# Patient Record
Sex: Female | Born: 1970 | Race: Black or African American | Hispanic: No | Marital: Single | Smoking: Never smoker
Health system: Southern US, Community
[De-identification: ages and names within clinical notes are randomized; demographics above are authoritative.]

## PROBLEM LIST (undated history)

## (undated) DIAGNOSIS — E119 Type 2 diabetes mellitus without complications: Secondary | ICD-10-CM

## (undated) DIAGNOSIS — K219 Gastro-esophageal reflux disease without esophagitis: Secondary | ICD-10-CM

## (undated) DIAGNOSIS — I10 Essential (primary) hypertension: Secondary | ICD-10-CM

## (undated) DIAGNOSIS — R519 Headache, unspecified: Secondary | ICD-10-CM

## (undated) DIAGNOSIS — R51 Headache: Secondary | ICD-10-CM

## (undated) DIAGNOSIS — D649 Anemia, unspecified: Secondary | ICD-10-CM

## (undated) HISTORY — DX: Type 2 diabetes mellitus without complications: E11.9

## (undated) HISTORY — PX: WISDOM TOOTH EXTRACTION: SHX21

---

## 1990-05-16 HISTORY — PX: TUBAL LIGATION: SHX77

## 2012-09-14 ENCOUNTER — Encounter (HOSPITAL_COMMUNITY): Payer: Self-pay | Admitting: Emergency Medicine

## 2012-09-14 ENCOUNTER — Emergency Department (HOSPITAL_COMMUNITY)
Admission: EM | Admit: 2012-09-14 | Discharge: 2012-09-15 | Disposition: A | Discharge status: Home / Self Care | Payer: No Typology Code available for payment source | Attending: Emergency Medicine | Admitting: Emergency Medicine

## 2012-09-14 DIAGNOSIS — K0889 Other specified disorders of teeth and supporting structures: Secondary | ICD-10-CM

## 2012-09-14 DIAGNOSIS — K089 Disorder of teeth and supporting structures, unspecified: Secondary | ICD-10-CM | POA: Insufficient documentation

## 2012-09-14 NOTE — ED Notes (Signed)
Pt states that a filling came out of her right upper tooth several weeks ago. Having pain off and on since then, became more intense over the past couple of weeks.

## 2012-09-14 NOTE — ED Notes (Signed)
PT. REPORTS RIGHT UPPER/LOWER MOLAR PAIN FOR SEVERAL WEEKS WORSE TODAY UNRELIEVED BY OTC IBUPROFEN .

## 2012-09-15 MED ORDER — HYDROCODONE-ACETAMINOPHEN 5-325 MG PO TABS: 1.0000 | ORAL_TABLET | ORAL | Status: DC | PRN

## 2012-09-15 MED ORDER — PENICILLIN V POTASSIUM 500 MG PO TABS: 500.0000 mg | ORAL_TABLET | Freq: Four times a day (QID) | ORAL | Status: AC

## 2012-09-15 NOTE — ED Provider Notes (Signed)
Medical screening examination/treatment/procedure(s) were performed by non-physician practitioner and as supervising physician I was immediately available for consultation/collaboration.   Rolan Bucco, MD 09/15/12 [phone]

## 2012-09-15 NOTE — ED Provider Notes (Signed)
History     CSN: [id number]  Arrival date & time 09/14/12  2148   First MD Initiated Contact with Patient 09/14/12 2229      Chief Complaint  Patient presents with  . Dental Pain    (Consider location/radiation/quality/duration/timing/severity/associated sxs/prior treatment) HPI Comments: Jessica Garrett is a 42 y/o F presenting to the ED with dental pain. Reported that the dental pain has been ongoing for the past week, mainly to the right upper and lower jaw with radiation to the right cheek, right ear, around the right eye, and right side of the neck. Described pain to be an intermittent throbbing sensation that worsens with chewing on the right side. Reported using Ibuprofen with minimal relief. Denied fever, chills, swelling, inflammation, abscess or cyst formation, visual distortions, numbness, tingling, gi symptoms, urinary symptoms.  Stated that she has not been to a dentist in years.   Patient is a 42 y.o. female presenting with tooth pain. The history is provided by the patient. No language interpreter was used.  Dental PainPrimary symptoms do not include headaches, fever, shortness of breath, sore throat or cough.  Additional symptoms do not include: trouble swallowing, ear pain and fatigue.    History reviewed. No pertinent past medical history.  History reviewed. No pertinent past surgical history.  No family history on file.  History  Substance Use Topics  . Smoking status: Never Smoker   . Smokeless tobacco: Not on file  . Alcohol Use: No    OB History   Grav Para Term Preterm Abortions TAB SAB Ect Mult Living                  Review of Systems  Constitutional: Negative for fever, chills and fatigue.  HENT: Positive for dental problem. Negative for ear pain, sore throat, trouble swallowing, neck pain, neck stiffness and tinnitus.   Eyes: Negative for photophobia, pain and visual disturbance.  Respiratory: Negative for cough, chest tightness and shortness of  breath.   Cardiovascular: Negative for chest pain.  Gastrointestinal: Negative for nausea, vomiting, abdominal pain, diarrhea and constipation.  Genitourinary: Negative for dysuria, hematuria, decreased urine volume and difficulty urinating.  Musculoskeletal: Positive for arthralgias.  Skin: Negative for rash.  Neurological: Negative for dizziness, weakness, light-headedness, numbness and headaches.  All other systems reviewed and are negative.    Allergies  Review of patient's allergies indicates no known allergies.  Home Medications   Current Outpatient Rx  Name  Route  Sig  Dispense  Refill  . ibuprofen (ADVIL,MOTRIN) 800 MG tablet   Oral   Take 800 mg by mouth every 8 (eight) hours as needed for pain. For pain         . HYDROcodone-acetaminophen (NORCO) 5-325 MG per tablet   Oral   Take 1 tablet by mouth every 4 (four) hours as needed for pain.   6 tablet   0   . penicillin v potassium (VEETID) 500 MG tablet   Oral   Take 1 tablet (500 mg total) by mouth 4 (four) times daily.   40 tablet   0     BP 174/93  Pulse 97  Temp(Src) 98.5 F (36.9 C) (Oral)  Resp 16  SpO2 99%  LMP 08/28/2012  Physical Exam  Nursing note and vitals reviewed. Constitutional: She is oriented to person, place, and time. She appears well-developed and well-nourished. No distress.  HENT:  Head: Normocephalic and atraumatic. No trismus in the jaw.  Mouth/Throat: Uvula is midline and oropharynx is  clear and moist. Mucous membranes are not pale, not dry and not cyanotic. She does not have dentures. No oral lesions. Dental caries present. No dental abscesses, edematous or lacerations. No oropharyngeal exudate, posterior oropharyngeal edema, posterior oropharyngeal erythema or tonsillar abscesses.    Uvula midline, symmetrical elevation  Mouth: Negative facial swelling, erythema, inflammation, sign of infection. Mucus membranes moist. No swelling, erythema, lesions, abscess, cyst, drainage,  bleeding, sign of infection noted to upper and lower gums and buccal mucosa. No sublingual lesions. No trismus Second lower molar on the right side noted to be loose - pain upon palpation with tongue depressor.   Eyes: Conjunctivae and EOM are normal. Pupils are equal, round, and reactive to light. Right eye exhibits no discharge. Left eye exhibits no discharge.  Neck: Normal range of motion. Neck supple. No tracheal deviation present.  Negative nuchal rigidity Negative lymphadenopathy   Cardiovascular: Normal rate, regular rhythm and normal heart sounds.  Exam reveals no friction rub.   No murmur heard. Radial pulses 2+ bilaterally  Pulmonary/Chest: Effort normal and breath sounds normal. No respiratory distress. She has no wheezes. She has no rales.  Lymphadenopathy:    She has no cervical adenopathy.  Neurological: She is alert and oriented to person, place, and time. No cranial nerve deficit. She exhibits normal muscle tone. Coordination normal.  Cranial nerves III-XII grossly intact  Skin: Skin is warm and dry. No rash noted. She is not diaphoretic. No erythema.  Psychiatric: She has a normal mood and affect. Her behavior is normal. Thought content normal.    ED Course  Procedures (including critical care time)  Labs Reviewed - No data to display No results found.   1. Pain, dental       MDM  I personally examined and evaluated the patient. Patient afebrile, normotensive, non-tachycardic, alert and oriented. Loose second right lower molar noted - pain with palpation to rigth lower second molar with tongue depressor. Dental caries noted, numerous, to upper and lower jaw. Negative sublingual lesion, negative trismus - r/o Ludwig's angina. Negative sign of infection or inflammation. Negative peritonsillar abscess noted. No sign of cyst or abscess noted to mouth. Patient aseptic, non-toxic appearing, in no acute distress - patient discharged. Patient discharged with dental pain from  poor dental hygiene. Discharged patient with antibiotic coverage and pain medications - discussed pain medications restrictions and precautions. Discussed to stay hydrated and ice. Referred patient to dentist - gave resource list. Discussed with patient to monitor symptoms and if symptoms are to worsen to report back to the ED.  Patient agreed to plan of care, understood, all questions answered.          Raymon Mutton, PA-C 09/15/12 [phone]

## 2014-01-07 ENCOUNTER — Encounter (HOSPITAL_COMMUNITY): Payer: Self-pay | Admitting: Emergency Medicine

## 2014-01-07 ENCOUNTER — Emergency Department (HOSPITAL_COMMUNITY)
Admission: EM | Admit: 2014-01-07 | Discharge: 2014-01-07 | Disposition: A | Discharge status: Home / Self Care | Payer: No Typology Code available for payment source | Attending: Emergency Medicine | Admitting: Emergency Medicine

## 2014-01-07 DIAGNOSIS — M545 Low back pain, unspecified: Secondary | ICD-10-CM | POA: Insufficient documentation

## 2014-01-07 DIAGNOSIS — Z79899 Other long term (current) drug therapy: Secondary | ICD-10-CM | POA: Diagnosis not present

## 2014-01-07 DIAGNOSIS — R1013 Epigastric pain: Secondary | ICD-10-CM | POA: Insufficient documentation

## 2014-01-07 DIAGNOSIS — Z3202 Encounter for pregnancy test, result negative: Secondary | ICD-10-CM | POA: Diagnosis not present

## 2014-01-07 LAB — URINE MICROSCOPIC-ADD ON

## 2014-01-07 LAB — URINALYSIS, ROUTINE W REFLEX MICROSCOPIC
Bilirubin Urine: NEGATIVE
GLUCOSE, UA: NEGATIVE mg/dL
Ketones, ur: NEGATIVE mg/dL
Leukocytes, UA: NEGATIVE
Nitrite: NEGATIVE
Protein, ur: NEGATIVE mg/dL
SPECIFIC GRAVITY, URINE: 1.011 (ref 1.005–1.030)
UROBILINOGEN UA: 0.2 mg/dL (ref 0.0–1.0)
pH: 6 (ref 5.0–8.0)

## 2014-01-07 LAB — CBC WITH DIFFERENTIAL/PLATELET
BASOS ABS: 0 10*3/uL (ref 0.0–0.1)
Basophils Relative: 0 % (ref 0–1)
Eosinophils Absolute: 0.1 10*3/uL (ref 0.0–0.7)
Eosinophils Relative: 1 % (ref 0–5)
HCT: 43.7 % (ref 36.0–46.0)
HEMOGLOBIN: 14.6 g/dL (ref 12.0–15.0)
LYMPHS PCT: 33 % (ref 12–46)
Lymphs Abs: 2.9 10*3/uL (ref 0.7–4.0)
MCH: 31.9 pg (ref 26.0–34.0)
MCHC: 33.4 g/dL (ref 30.0–36.0)
MCV: 95.6 fL (ref 78.0–100.0)
MONOS PCT: 7 % (ref 3–12)
Monocytes Absolute: 0.6 10*3/uL (ref 0.1–1.0)
NEUTROS ABS: 5.3 10*3/uL (ref 1.7–7.7)
Neutrophils Relative %: 59 % (ref 43–77)
Platelets: 293 10*3/uL (ref 150–400)
RBC: 4.57 MIL/uL (ref 3.87–5.11)
RDW: 13.4 % (ref 11.5–15.5)
WBC: 8.8 10*3/uL (ref 4.0–10.5)

## 2014-01-07 LAB — COMPREHENSIVE METABOLIC PANEL
ALBUMIN: 3.8 g/dL (ref 3.5–5.2)
ALT: 15 U/L (ref 0–35)
ANION GAP: 10 (ref 5–15)
AST: 16 U/L (ref 0–37)
Alkaline Phosphatase: 61 U/L (ref 39–117)
BUN: 9 mg/dL (ref 6–23)
CO2: 25 mEq/L (ref 19–32)
CREATININE: 0.76 mg/dL (ref 0.50–1.10)
Calcium: 9.4 mg/dL (ref 8.4–10.5)
Chloride: 102 mEq/L (ref 96–112)
GFR calc Af Amer: >90 mL/min (ref >90)
GFR calc non Af Amer: >90 mL/min (ref >90)
Glucose, Bld: 115 mg/dL — ABNORMAL HIGH (ref 70–99)
Potassium: 4.2 mEq/L (ref 3.7–5.3)
Sodium: 137 mEq/L (ref 137–147)
TOTAL PROTEIN: 7.6 g/dL (ref 6.0–8.3)
Total Bilirubin: 0.3 mg/dL (ref 0.3–1.2)

## 2014-01-07 LAB — LIPASE, BLOOD: Lipase: 18 U/L (ref 11–59)

## 2014-01-07 LAB — POC URINE PREG, ED: PREG TEST UR: NEGATIVE

## 2014-01-07 MED ORDER — OXYCODONE-ACETAMINOPHEN 5-325 MG PO TABS: 1.0000 | ORAL_TABLET | Freq: Once | ORAL | Status: DC

## 2014-01-07 MED ORDER — PANTOPRAZOLE SODIUM 20 MG PO TBEC: 20.0000 mg | DELAYED_RELEASE_TABLET | Freq: Every day | ORAL | Status: DC

## 2014-01-07 MED ORDER — HYDROCODONE-ACETAMINOPHEN 5-325 MG PO TABS: 1.0000 | ORAL_TABLET | ORAL | Status: DC | PRN

## 2014-01-07 NOTE — ED Notes (Signed)
Pt reports upper abd pain for over a month, now having lower sharp back pain. Denies any n/v/d or urinary symptoms.

## 2014-01-07 NOTE — ED Notes (Addendum)
Pt here for evaluation of lower back pain since Friday, as well as mid abdominal pain for about a month. Denies painful urination. Denies nausea vomiting or abnormal bowel movements.

## 2014-01-07 NOTE — Discharge Instructions (Signed)
Use Maalox, 30 cc, before meals and at bedtime. Stop taking Ibuprofen. If your abdomen is not better by Friday, ask your doctor to order an abdominal ultrasound.   Abdominal Pain Many things can cause abdominal pain. Usually, abdominal pain is not caused by a disease and will improve without treatment. It can often be observed and treated at home. Your health care provider will do a physical exam and possibly order blood tests and X-rays to help determine the seriousness of your pain. However, in many cases, more time must pass before a clear cause of the pain can be found. Before that point, your health care provider may not know if you need more testing or further treatment. HOME CARE INSTRUCTIONS  Monitor your abdominal pain for any changes. The following actions may help to alleviate any discomfort you are experiencing:  Only take over-the-counter or prescription medicines as directed by your health care provider.  Do not take laxatives unless directed to do so by your health care provider.  Try a clear liquid diet (broth, tea, or water) as directed by your health care provider. Slowly move to a bland diet as tolerated. SEEK MEDICAL CARE IF:  You have unexplained abdominal pain.  You have abdominal pain associated with nausea or diarrhea.  You have pain when you urinate or have a bowel movement.  You experience abdominal pain that wakes you in the night.  You have abdominal pain that is worsened or improved by eating food.  You have abdominal pain that is worsened with eating fatty foods.  You have a fever. SEEK IMMEDIATE MEDICAL CARE IF:   Your pain does not go away within 2 hours.  You keep throwing up (vomiting).  Your pain is felt only in portions of the abdomen, such as the right side or the left lower portion of the abdomen.  You pass bloody or black tarry stools. MAKE SURE YOU:  Understand these instructions.   Will watch your condition.   Will get help right  away if you are not doing well or get worse.  Document Released: 02/09/2005 Document Revised: 05/07/2013 Document Reviewed: 01/09/2013 [facility] Patient Information 2015 [place], [place]. This information is not intended to replace advice given to you by your health care provider. Make sure you discuss any questions you have with your health care provider.  Back Pain, Adult Low back pain is very common. About 1 in 5 people have back pain.The cause of low back pain is rarely dangerous. The pain often gets better over time.About half of people with a sudden onset of back pain feel better in just 2 weeks. About 8 in 10 people feel better by 6 weeks.  CAUSES Some common causes of back pain include:  Strain of the muscles or ligaments supporting the spine.  Wear and tear (degeneration) of the spinal discs.  Arthritis.  Direct injury to the back. DIAGNOSIS Most of the time, the direct cause of low back pain is not known.However, back pain can be treated effectively even when the exact cause of the pain is unknown.Answering your caregiver's questions about your overall health and symptoms is one of the most accurate ways to make sure the cause of your pain is not dangerous. If your caregiver needs more information, he or she may order lab work or imaging tests (X-rays or MRIs).However, even if imaging tests show changes in your back, this usually does not require surgery. HOME CARE INSTRUCTIONS For many people, back pain returns.Since low back pain is  rarely dangerous, it is often a condition that people can learn to [facility] their own.   Remain active. It is stressful on the back to sit or stand in one place. Do not sit, drive, or stand in one place for more than 30 minutes at a time. Take short walks on level surfaces as soon as pain allows.Try to increase the length of time you walk each day.  Do not stay in bed.Resting more than 1 or 2 days can delay your recovery.  Do not avoid  exercise or work.Your body is made to move.It is not dangerous to be active, even though your back may hurt.Your back will likely heal faster if you return to being active before your pain is gone.  Pay attention to your body when you bend and lift. Many people have less discomfortwhen lifting if they bend their knees, keep the load close to their bodies,and avoid twisting. Often, the most comfortable positions are those that put less stress on your recovering back.  Find a comfortable position to sleep. Use a firm mattress and lie on your side with your knees slightly bent. If you lie on your back, put a pillow under your knees.  Only take over-the-counter or prescription medicines as directed by your caregiver. Over-the-counter medicines to reduce pain and inflammation are often the most helpful.Your caregiver may prescribe muscle relaxant drugs.These medicines help dull your pain so you can more quickly return to your normal activities and healthy exercise.  Put ice on the injured area.  Put ice in a plastic bag.  Place a towel between your skin and the bag.  Leave the ice on for 15-20 minutes, 03-04 times a day for the first 2 to 3 days. After that, ice and heat may be alternated to reduce pain and spasms.  Ask your caregiver about trying back exercises and gentle massage. This may be of some benefit.  Avoid feeling anxious or stressed.Stress increases muscle tension and can worsen back pain.It is important to recognize when you are anxious or stressed and learn ways to manage it.Exercise is a great option. SEEK MEDICAL CARE IF:  You have pain that is not relieved with rest or medicine.  You have pain that does not improve in 1 week.  You have new symptoms.  You are generally not feeling well. SEEK IMMEDIATE MEDICAL CARE IF:   You have pain that radiates from your back into your legs.  You develop new bowel or bladder control problems.  You have unusual weakness or  numbness in your arms or legs.  You develop nausea or vomiting.  You develop abdominal pain.  You feel faint. Document Released: 05/02/2005 Document Revised: 11/01/2011 Document Reviewed: 09/03/2013 [facility] Patient Information 2015 [place], [place]. This information is not intended to replace advice given to you by your health care provider. Make sure you discuss any questions you have with your health care provider.  Back Exercises Back exercises help treat and prevent back injuries. The goal is to increase your strength in your belly (abdominal) and back muscles. These exercises can also help with flexibility. Start these exercises when told by your doctor. HOME CARE Back exercises include: Pelvic Tilt.  Lie on your back with your knees bent. Tilt your pelvis until the lower part of your back is against the floor. Hold this position 5 to 10 sec. Repeat this exercise 5 to 10 times. Knee to Chest.  Pull 1 knee up against your chest and hold for 20 to 30  seconds. Repeat this with the other knee. This may be done with the other leg straight or bent, whichever feels better. Then, pull both knees up against your chest. Sit-Ups or Curl-Ups.  Bend your knees 90 degrees. Start with tilting your pelvis, and do a partial, slow sit-up. Only lift your upper half 30 to 45 degrees off the floor. Take at least 2 to 3 seonds for each sit-up. Do not do sit-ups with your knees out straight. If partial sit-ups are difficult, simply do the above but with only tightening your belly (abdominal) muscles and holding it as told. Hip-Lift.  Lie on your back with your knees flexed 90 degrees. Push down with your feet and shoulders as you raise your hips 2 inches off the floor. Hold for 10 seconds, repeat 5 to 10 times. Back Arches.  Lie on your stomach. Prop yourself up on bent elbows. Slowly press on your hands, causing an arch in your low back. Repeat 3 to 5 times. Shoulder-Lifts.  Lie face down with arms  beside your body. Keep hips and belly pressed to floor as you slowly lift your head and shoulders off the floor. Do not overdo your exercises. Be careful in the beginning. Exercises may cause you some mild back discomfort. If the pain lasts for more than 15 minutes, stop the exercises until you see your doctor. Improvement with exercise for back problems is slow.  Document Released: 06/04/2010 Document Revised: 07/25/2011 Document Reviewed: 03/03/2011 [facility] Patient Information 2015 [place], [place]. This information is not intended to replace advice given to you by your health care provider. Make sure you discuss any questions you have with your health care provider.

## 2014-01-07 NOTE — ED Provider Notes (Signed)
CSN: [id number]     Arrival date & time 01/07/14  1156 History   First MD Initiated Contact with Patient 01/07/14 1609     Chief Complaint  Patient presents with  . Abdominal Pain     (Consider location/radiation/quality/duration/timing/severity/associated sxs/prior Treatment) HPI  Jessica Garrett is a 43 y.o. female who is here for evaluation of upper abdominal pain, present for one month. She's also been having ongoing lower back pain and is taking ibuprofen, for that. She saw a chiropractor yesterday and was told she needs a follow up appointment, next week. She has scheduled a new visit appointment with primary care doctor for later this week. She works as a Interior and spatial designer. Her abdominal pain is worse at night. There is no change in her abdominal pain with eating. Location of the abdominal pain is epigastric. She denies fever, chills, nausea, vomiting, diarrhea, cough, shortness of breath, paresthesias, or problems walking. She does not smoke cigarettes. There are no other known modifying factors.   History reviewed. No pertinent past medical history. History reviewed. No pertinent past surgical history. History reviewed. No pertinent family history. History  Substance Use Topics  . Smoking status: Never Smoker   . Smokeless tobacco: Not on file  . Alcohol Use: No   OB History   Grav Para Term Preterm Abortions TAB SAB Ect Mult Living                 Review of Systems  All other systems reviewed and are negative.     Allergies  Review of patient's allergies indicates no known allergies.  Home Medications   Prior to Admission medications   Medication Sig Start Date End Date Taking? Authorizing Provider  HYDROcodone-acetaminophen (NORCO) 5-325 MG per tablet Take 1 tablet by mouth every 4 (four) hours as needed for pain. 09/15/12   Marissa Sciacca, PA-C  HYDROcodone-acetaminophen (NORCO) 5-325 MG per tablet Take 1 tablet by mouth every 4 (four) hours as needed. 01/07/14   Flint Melter, MD  pantoprazole (PROTONIX) 20 MG tablet Take 1 tablet (20 mg total) by mouth daily. 01/07/14   Flint Melter, MD   BP 146/98  Pulse 75  Temp(Src) 98.7 F (37.1 C) (Oral)  Resp 16  Ht  (1.626 m)  Wt 150 lb (68.04 kg)  BMI 25.73 kg/m2  SpO2 98%  LMP 01/05/2014 Physical Exam  Nursing note and vitals reviewed. Constitutional: She is oriented to person, place, and time. She appears well-developed and well-nourished.  HENT:  Head: Normocephalic and atraumatic.  Eyes: Conjunctivae and EOM are normal. Pupils are equal, round, and reactive to light.  Neck: Normal range of motion and phonation normal. Neck supple.  Cardiovascular: Normal rate, regular rhythm and intact distal pulses.   Pulmonary/Chest: Effort normal and breath sounds normal. She exhibits no tenderness.  Abdominal: Soft. She exhibits no distension and no mass. There is tenderness (Mild epigastric tenderness. There is no right upper quadrant, tenderness, or guarding). There is no rebound and no guarding.  Musculoskeletal: Normal range of motion. She exhibits no edema.  Mild diffuse, low back pain to palpation with normal range of motion.  Neurological: She is alert and oriented to person, place, and time. She exhibits normal muscle tone.  Skin: Skin is warm and dry.  Psychiatric: She has a normal mood and affect. Her behavior is normal. Judgment and thought content normal.    ED Course  Procedures (including critical care time)  Medications - No data to display  Patient  Vitals for the past 24 hrs:  BP Temp Temp src Pulse Resp SpO2 Height Weight  01/07/14 1642 146/98 mmHg - - 75 16 98 % - -  01/07/14 1415 138/96 mmHg 98.7 F (37.1 C) Oral 73 17 100 % - -  01/07/14 1233 158/93 mmHg 98.2 F (36.8 C) Oral 71 18 98 %  (1.626 m) 150 lb (68.04 kg)   Findings were discussed with the patient, questions answered   Labs Review Labs Reviewed  COMPREHENSIVE METABOLIC PANEL - Abnormal; Notable for the  following:    Glucose, Bld 115 (*)    All other components within normal limits  URINALYSIS, ROUTINE W REFLEX MICROSCOPIC - Abnormal; Notable for the following:    Hgb urine dipstick MODERATE (*)    All other components within normal limits  URINE MICROSCOPIC-ADD ON - Abnormal; Notable for the following:    Squamous Epithelial / LPF FEW (*)    All other components within normal limits  CBC WITH DIFFERENTIAL  LIPASE, BLOOD  POC URINE PREG, ED    Imaging Review No results found.   EKG Interpretation None      MDM   Final diagnoses:  Epigastric pain  Low back pain without sciatica, unspecified back pain laterality   Nonspecific epigastric pain is likely related to ibuprofen use. She also may have onset of peptic ulcer disease. She will need to stop taking ibuprofen. I suspect her back pain is related to her job, and has resulted in muscle strain. This can be treated symptomatically, and expectantly. She has a short term followup with her PCP. Further evaluation can assume after that visit.  Nursing Notes Reviewed/ Care Coordinated Applicable Imaging Reviewed Interpretation of Laboratory Data incorporated into ED treatment  The patient appears reasonably screened and/or stabilized for discharge and I doubt any other medical condition or other [facility] requiring further screening, evaluation, or treatment in the ED at this time prior to discharge.  Plan: Home Medications- Norco, Protonix; Home Treatments- rest; return here if the recommended treatment, does not improve the symptoms; Recommended follow up- PCP as scheduled    Flint Melter, MD 01/07/14 1705

## 2014-01-07 NOTE — ED Notes (Signed)
Pt is here with upper abdominal pain and lower back pain.  Denies vomiting or diarrhea or nausea.

## 2015-01-06 ENCOUNTER — Emergency Department (HOSPITAL_COMMUNITY)
Admission: EM | Admit: 2015-01-06 | Discharge: 2015-01-07 | Disposition: A | Discharge status: Home / Self Care | Payer: No Typology Code available for payment source | Attending: Emergency Medicine | Admitting: Emergency Medicine

## 2015-01-06 ENCOUNTER — Encounter (HOSPITAL_COMMUNITY): Payer: Self-pay

## 2015-01-06 DIAGNOSIS — Z3202 Encounter for pregnancy test, result negative: Secondary | ICD-10-CM | POA: Insufficient documentation

## 2015-01-06 DIAGNOSIS — G43009 Migraine without aura, not intractable, without status migrainosus: Secondary | ICD-10-CM

## 2015-01-06 DIAGNOSIS — G43909 Migraine, unspecified, not intractable, without status migrainosus: Secondary | ICD-10-CM | POA: Insufficient documentation

## 2015-01-06 DIAGNOSIS — I158 Other secondary hypertension: Secondary | ICD-10-CM | POA: Insufficient documentation

## 2015-01-06 DIAGNOSIS — Z79899 Other long term (current) drug therapy: Secondary | ICD-10-CM | POA: Insufficient documentation

## 2015-01-06 MED ORDER — METOCLOPRAMIDE HCL 5 MG/ML IJ SOLN
10.0000 mg | Freq: Once | INTRAMUSCULAR | Status: AC
  Administered 2015-01-07: 10 mg via INTRAVENOUS
  Filled 2015-01-06: qty 2

## 2015-01-06 MED ORDER — KETOROLAC TROMETHAMINE 30 MG/ML IJ SOLN
30.0000 mg | Freq: Once | INTRAMUSCULAR | Status: AC
  Administered 2015-01-07: 30 mg via INTRAVENOUS
  Filled 2015-01-06: qty 1

## 2015-01-06 MED ORDER — DIPHENHYDRAMINE HCL 50 MG/ML IJ SOLN
25.0000 mg | Freq: Once | INTRAMUSCULAR | Status: AC
  Administered 2015-01-07: 25 mg via INTRAVENOUS
  Filled 2015-01-06: qty 1

## 2015-01-06 MED ORDER — SODIUM CHLORIDE 0.9 % IV BOLUS (SEPSIS)
1000.0000 mL | Freq: Once | INTRAVENOUS | Status: AC
  Administered 2015-01-07: 1000 mL via INTRAVENOUS

## 2015-01-06 NOTE — ED Notes (Signed)
Onset 3 days ago intermittant posterior headache.  Pt checked BP yesterday and was elevated.  No h/o HBP.  Pt took Aleve last night with no relief.  No other s/s.

## 2015-01-06 NOTE — ED Provider Notes (Signed)
This chart was scribed for Jessica Maw Jamison Yuhasz, DO by Jessica Garrett, ED Scribe. This patient was seen in room D35C/D35C and the patient's care was started 11:19 PM.    TIME SEEN: 11:19 PM   CHIEF COMPLAINT:  Chief Complaint  Patient presents with  . Headache     HPI:  HPI Comments: Jessica Garrett is a 44 y.o. female without any pertinent past medical history who presents to the Emergency Department complaining of an intermittent, ongoing, throbbing posterior moderate HA without radiation x 3 days. No recent injury or trauma to head. Pt states she took her blood pressure earlier this evening with an elevated reading.  States she checked her blood pressure because a friend recommended that she do so and was concerned that it was elevated and that was what prompted her to come to the emergency department. However, pt is unsure of what her blood pressure runs at baseline. HA is worsened with "being in the sun". No alleviating factors at this time. OTC Aleve attempted yesterday without any improvement for discomfort. Has had nausea with these headaches. No recent fever, chills, chest pain, vision changes, vomiting, or abdominal pain. No weakness, loss of sensation, or weakness. No personal or family history of HAs. States she has had similar headaches in the past, last time was in June. She is not currently followed by a PCP. Pt is not currently on any anticoagulants. No known allergies to medications.   ROS: See HPI Constitutional: no fever  Eyes: no drainage  ENT: no runny nose   Cardiovascular:  no chest pain  Resp: no SOB  GI: no vomiting. Positive for nausea GU: no dysuria Integumentary: no rash  Allergy: no hives  Musculoskeletal: no leg swelling  Neurological: no slurred speech. positive for headache  ROS otherwise negative  PAST MEDICAL HISTORY/PAST SURGICAL HISTORY:  History reviewed. No pertinent past medical history.  MEDICATIONS:  Prior to Admission medications   Medication Sig  Start Date End Date Taking? Authorizing Provider  HYDROcodone-acetaminophen (NORCO) 5-325 MG per tablet Take 1 tablet by mouth every 4 (four) hours as needed for pain. 09/15/12   Marissa Sciacca, PA-C  HYDROcodone-acetaminophen (NORCO) 5-325 MG per tablet Take 1 tablet by mouth every 4 (four) hours as needed. 01/07/14   Mancel Bale, MD  pantoprazole (PROTONIX) 20 MG tablet Take 1 tablet (20 mg total) by mouth daily. 01/07/14   Mancel Bale, MD    ALLERGIES:  No Known Allergies  SOCIAL HISTORY:  Social History  Substance Use Topics  . Smoking status: Never Smoker   . Smokeless tobacco: Not on file  . Alcohol Use: 3.6 oz/week    4 Shots of liquor, 2 Glasses of wine per week    FAMILY HISTORY: History reviewed. No pertinent family history.  EXAM: BP 189/100 mmHg  Pulse 87  Temp(Src) 98.5 F (36.9 C)  Resp 18  Ht  (1.626 m)  Wt 143 lb (64.864 kg)  BMI 24.53 kg/m2  SpO2 100%  LMP 01/03/2015 CONSTITUTIONAL: Alert and oriented and responds appropriately to questions. Well-appearing; well-nourished, in no significant distress HEAD: Normocephalic EYES: Conjunctivae clear, PERRL ENT: normal nose; no rhinorrhea; moist mucous membranes; pharynx without lesions noted NECK: Supple, no meningismus, no LAD  CARD: RRR; S1 and S2 appreciated; no murmurs, no clicks, no rubs, no gallops RESP: Normal chest excursion without splinting or tachypnea; breath sounds clear and equal bilaterally; no wheezes, no rhonchi, no rales, no hypoxia or respiratory distress, speaking full sentences ABD/GI: Normal bowel sounds;  non-distended; soft, non-tender, no rebound, no guarding, no peritoneal signs BACK:  The back appears normal and is non-tender to palpation, there is no CVA tenderness EXT: Normal ROM in all joints; non-tender to palpation; no edema; normal capillary refill; no cyanosis, no calf tenderness or swelling    SKIN: Normal color for age and race; warm NEURO: Moves all extremities equally,  sensation to light touch intact diffusely, cranial nerves II through XII intact PSYCH: The patient's mood and manner are appropriate. Grooming and personal hygiene are appropriate.  MEDICAL DECISION MAKING: Patient here with headache that sounds like a migraine headache. We'll treat with IV fluids, Toradol, Reglan and Benadryl. She is hypertensive in the emergency department but doubt intracranial hemorrhage. Will check for other signs of a normal damage with labs, urine, EKG. Discussed with patient that her hypertension may be secondary to her headache and have recommended that she keep a log of her blood pressure at home. She does not have a primary care physician.  ED PROGRESS: 1:00 AM  Pt's headache is gone. Her blood pressure is now 154/84. Labs unremarkable. She is not pregnant.  Patient's urine shows hematuria which she has had previously but no proteinuria. No other sign of end Garrett damage. I feel she is safe to be discharged. Have advised her to keep a log of her blood pressure and follow-up with a primary care physician. We'll provide with outpatient follow-up resources. We'll discharge with prescription for Fioricet, Phenergan if headaches return. Discussed return precautions. She verbalized understanding and is comfortable with plan.     EKG Interpretation  Date/Time:  Wednesday January 07 2015 00:38:35 EDT Ventricular Rate:  75 PR Interval:  161 QRS Duration: 62 QT Interval:  366 QTC Calculation: 409 R Axis:   48 Text Interpretation:  Sinus rhythm LAE, consider biatrial enlargement No old tracing to compare Confirmed by Abdirahman Chittum,  DO, Zylen Wenig ([id number]) on 01/07/2015 12:46:08 AM         I personally performed the services described in this documentation, which was scribed in my presence. The recorded information has been reviewed and is accurate.   Jessica Maw Khilynn Borntreger, DO 01/07/15 [phone]

## 2015-01-07 LAB — CBC WITH DIFFERENTIAL/PLATELET
BASOS ABS: 0 10*3/uL (ref 0.0–0.1)
BASOS PCT: 0 % (ref 0–1)
Eosinophils Absolute: 0.1 10*3/uL (ref 0.0–0.7)
Eosinophils Relative: 1 % (ref 0–5)
HCT: 43.3 % (ref 36.0–46.0)
Hemoglobin: 14.7 g/dL (ref 12.0–15.0)
Lymphocytes Relative: 53 % — ABNORMAL HIGH (ref 12–46)
Lymphs Abs: 4.4 10*3/uL — ABNORMAL HIGH (ref 0.7–4.0)
MCH: 31.7 pg (ref 26.0–34.0)
MCHC: 33.9 g/dL (ref 30.0–36.0)
MCV: 93.5 fL (ref 78.0–100.0)
MONO ABS: 0.9 10*3/uL (ref 0.1–1.0)
Monocytes Relative: 10 % (ref 3–12)
NEUTROS PCT: 36 % — AB (ref 43–77)
Neutro Abs: 2.9 10*3/uL (ref 1.7–7.7)
Platelets: 275 10*3/uL (ref 150–400)
RBC: 4.63 MIL/uL (ref 3.87–5.11)
RDW: 13.7 % (ref 11.5–15.5)
WBC: 8.3 10*3/uL (ref 4.0–10.5)

## 2015-01-07 LAB — URINE MICROSCOPIC-ADD ON

## 2015-01-07 LAB — BASIC METABOLIC PANEL
Anion gap: 12 (ref 5–15)
BUN: 11 mg/dL (ref 6–20)
CALCIUM: 9.4 mg/dL (ref 8.9–10.3)
CO2: 22 mmol/L (ref 22–32)
Chloride: 102 mmol/L (ref 101–111)
Creatinine, Ser: 0.91 mg/dL (ref 0.44–1.00)
GFR calc non Af Amer: >60 mL/min (ref >60)
Glucose, Bld: 93 mg/dL (ref 65–99)
Potassium: 4.1 mmol/L (ref 3.5–5.1)
SODIUM: 136 mmol/L (ref 135–145)

## 2015-01-07 LAB — URINALYSIS, ROUTINE W REFLEX MICROSCOPIC
BILIRUBIN URINE: NEGATIVE
Glucose, UA: NEGATIVE mg/dL
KETONES UR: NEGATIVE mg/dL
Leukocytes, UA: NEGATIVE
Nitrite: NEGATIVE
PROTEIN: NEGATIVE mg/dL
Specific Gravity, Urine: 1.008 (ref 1.005–1.030)
UROBILINOGEN UA: 0.2 mg/dL (ref 0.0–1.0)
pH: 6.5 (ref 5.0–8.0)

## 2015-01-07 LAB — POC URINE PREG, ED: PREG TEST UR: NEGATIVE

## 2015-01-07 MED ORDER — PROMETHAZINE HCL 25 MG PO TABS: 25.0000 mg | ORAL_TABLET | Freq: Four times a day (QID) | ORAL | Status: DC | PRN

## 2015-01-07 MED ORDER — BUTALBITAL-APAP-CAFFEINE 50-325-40 MG PO TABS: 1.0000 | ORAL_TABLET | Freq: Four times a day (QID) | ORAL | Status: AC | PRN

## 2015-01-07 MED ORDER — LORAZEPAM 2 MG/ML IJ SOLN: 1.0000 mg | Freq: Once | INTRAMUSCULAR | Status: DC

## 2015-01-07 NOTE — Discharge Instructions (Signed)
I think that your blood pressure being elevated today is secondary to your migraine headache. I recommend you keep a long of your blood pressure every day at the same time every day when you're feeling well so that you may present this to your primary care physician today can decide if you need to be started on blood pressure medication. A few develop chest pain, shortness of breath, changes in your vision, numbness or weakness on one side of your body, changes in your speech, please return to the hospital.   Migraine Headache A migraine headache is an intense, throbbing pain on one or both sides of your head. A migraine can last for 30 minutes to several hours. CAUSES  The exact cause of a migraine headache is not always known. However, a migraine may be caused when nerves in the brain become irritated and release chemicals that cause inflammation. This causes pain. Certain things may also trigger migraines, such as:  Alcohol.  Smoking.  Stress.  Menstruation.  Aged cheeses.  Foods or drinks that contain nitrates, glutamate, aspartame, or tyramine.  Lack of sleep.  Chocolate.  Caffeine.  Hunger.  Physical exertion.  Fatigue.  Medicines used to treat chest pain (nitroglycerine), birth control pills, estrogen, and some blood pressure medicines. SIGNS AND SYMPTOMS  Pain on one or both sides of your head.  Pulsating or throbbing pain.  Severe pain that prevents daily activities.  Pain that is aggravated by any physical activity.  Nausea, vomiting, or both.  Dizziness.  Pain with exposure to bright lights, loud noises, or activity.  General sensitivity to bright lights, loud noises, or smells. Before you get a migraine, you may get warning signs that a migraine is coming (aura). An aura may include:  Seeing flashing lights.  Seeing bright spots, halos, or zigzag lines.  Having tunnel vision or blurred vision.  Having feelings of numbness or tingling.  Having  trouble talking.  Having muscle weakness. DIAGNOSIS  A migraine headache is often diagnosed based on:  Symptoms.  Physical exam.  A CT scan or MRI of your head. These imaging tests cannot diagnose migraines, but they can help rule out other causes of headaches. TREATMENT Medicines may be given for pain and nausea. Medicines can also be given to help prevent recurrent migraines.  HOME CARE INSTRUCTIONS  Only take over-the-counter or prescription medicines for pain or discomfort as directed by your health care provider. The use of long-term narcotics is not recommended.  Lie down in a dark, quiet room when you have a migraine.  Keep a journal to find out what may trigger your migraine headaches. For example, write down:  What you eat and drink.  How much sleep you get.  Any change to your diet or medicines.  Limit alcohol consumption.  Quit smoking if you smoke.  Get 7-9 hours of sleep, or as recommended by your health care provider.  Limit stress.  Keep lights dim if bright lights bother you and make your migraines worse. SEEK IMMEDIATE MEDICAL CARE IF:   Your migraine becomes severe.  You have a fever.  You have a stiff neck.  You have vision loss.  You have muscular weakness or loss of muscle control.  You start losing your balance or have trouble walking.  You feel faint or pass out.  You have severe symptoms that are different from your first symptoms. MAKE SURE YOU:   Understand these instructions.  Will watch your condition.  Will get help right away  if you are not doing well or get worse. Document Released: 05/02/2005 Document Revised: 09/16/2013 Document Reviewed: 01/07/2013 [facility] Patient Information 2015 [place], [place]. This information is not intended to replace advice given to you by your health care provider. Make sure you discuss any questions you have with your health care provider.  How to Take Your Blood Pressure HOW DO I GET A BLOOD  PRESSURE MACHINE?  You can buy an electronic home blood pressure machine at your local pharmacy. Insurance will sometimes cover the cost if you have a prescription.  Ask your doctor what type of machine is best for you. There are different machines for your arm and your wrist.  If you decide to buy a machine to check your blood pressure on your arm, first check the size of your arm so you can buy the right size cuff. To check the size of your arm:   Use a measuring tape that shows both inches and centimeters.   Wrap the measuring tape around the upper-middle part of your arm. You may need someone to help you measure.   Write down your arm measurement in both inches and centimeters.   To measure your blood pressure correctly, it is important to have the right size cuff.   If your arm is up to 13 inches (up to 34 centimeters), get an adult cuff size.  If your arm is 13 to 17 inches (35 to 44 centimeters), get a large adult cuff size.    If your arm is 17 to 20 inches (45 to 52 centimeters), get an adult thigh cuff.  WHAT DO THE NUMBERS MEAN?   There are two numbers that make up your blood pressure. For example: 120/80.  The first number (120 in our example) is called the "systolic pressure." It is a measure of the pressure in your blood vessels when your heart is pumping blood.  The second number (80 in our example) is called the "diastolic pressure." It is a measure of the pressure in your blood vessels when your heart is resting between beats.  Your doctor will tell you what your blood pressure should be. WHAT SHOULD I DO BEFORE I CHECK MY BLOOD PRESSURE?   Try to rest or relax for at least 30 minutes before you check your blood pressure.  Do not smoke.  Do not have any drinks with caffeine, such as:  Soda.  Coffee.  Tea.  Check your blood pressure in a quiet room.  Sit down and stretch out your arm on a table. Keep your arm at about the level of your heart. Let  your arm relax.  Make sure that your legs are not crossed. HOW DO I CHECK MY BLOOD PRESSURE?  Follow the directions that came with your machine.  Make sure you remove any tight-fighting clothing from your arm or wrist. Wrap the cuff around your upper arm or wrist. You should be able to fit a finger between the cuff and your arm. If you cannot fit a finger between the cuff and your arm, it is too tight and should be removed and rewrapped.  Some units require you to manually pump up the arm cuff.  Automatic units inflate the cuff when you press a button.  Cuff deflation is automatic in both models.  After the cuff is inflated, the unit measures your blood pressure and pulse. The readings are shown on a monitor. Hold still and breathe normally while the cuff is inflated.  Getting a reading  takes less than a minute.  Some models store readings in a memory. Some provide a printout of readings. If your machine does not store your readings, keep a written record.  Take readings with you to your next visit with your doctor. Document Released: 04/14/2008 Document Revised: 09/16/2013 Document Reviewed: 06/27/2013 [facility] Patient Information 2015 [place], [place]. This information is not intended to replace advice given to you by your health care provider. Make sure you discuss any questions you have with your health care provider.  Hypertension Hypertension, commonly called high blood pressure, is when the force of blood pumping through your arteries is too strong. Your arteries are the blood vessels that carry blood from your heart throughout your body. A blood pressure reading consists of a higher number over a lower number, such as 110/72. The higher number (systolic) is the pressure inside your arteries when your heart pumps. The lower number (diastolic) is the pressure inside your arteries when your heart relaxes. Ideally you want your blood pressure below 120/80. Hypertension forces your heart  to work harder to pump blood. Your arteries may become narrow or stiff. Having hypertension puts you at risk for heart disease, stroke, and other problems.  RISK FACTORS Some risk factors for high blood pressure are controllable. Others are not.  Risk factors you cannot control include:   Race. You may be at higher risk if you are African American.  Age. Risk increases with age.  Gender. Men are at higher risk than women before age 21 years. After age 29, women are at higher risk than men. Risk factors you can control include:  Not getting enough exercise or physical activity.  Being overweight.  Getting too much fat, sugar, calories, or salt in your diet.  Drinking too much alcohol. SIGNS AND SYMPTOMS Hypertension does not usually cause signs or symptoms. Extremely high blood pressure (hypertensive crisis) may cause headache, anxiety, shortness of breath, and nosebleed. DIAGNOSIS  To check if you have hypertension, your health care provider will measure your blood pressure while you are seated, with your arm held at the level of your heart. It should be measured at least twice using the same arm. Certain conditions can cause a difference in blood pressure between your right and left arms. A blood pressure reading that is higher than normal on one occasion does not mean that you need treatment. If one blood pressure reading is high, ask your health care provider about having it checked again. TREATMENT  Treating high blood pressure includes making lifestyle changes and possibly taking medicine. Living a healthy lifestyle can help lower high blood pressure. You may need to change some of your habits. Lifestyle changes may include:  Following the DASH diet. This diet is high in fruits, vegetables, and whole grains. It is low in salt, red meat, and added sugars.  Getting at least 2 hours of brisk physical activity every week.  Losing weight if necessary.  Not smoking.  Limiting  alcoholic beverages.  Learning ways to reduce stress. If lifestyle changes are not enough to get your blood pressure under control, your health care provider may prescribe medicine. You may need to take more than one. Work closely with your health care provider to understand the risks and benefits. HOME CARE INSTRUCTIONS  Have your blood pressure rechecked as directed by your health care provider.   Take medicines only as directed by your health care provider. Follow the directions carefully. Blood pressure medicines must be taken as prescribed. The medicine  does not work as well when you skip doses. Skipping doses also puts you at risk for problems.   Do not smoke.   Monitor your blood pressure at home as directed by your health care provider. SEEK MEDICAL CARE IF:   You think you are having a reaction to medicines taken.  You have recurrent headaches or feel dizzy.  You have swelling in your ankles.  You have trouble with your vision. SEEK IMMEDIATE MEDICAL CARE IF:  You develop a severe headache or confusion.  You have unusual weakness, numbness, or feel faint.  You have severe chest or abdominal pain.  You vomit repeatedly.  You have trouble breathing. MAKE SURE YOU:   Understand these instructions.  Will watch your condition.  Will get help right away if you are not doing well or get worse. Document Released: 05/02/2005 Document Revised: 09/16/2013 Document Reviewed: 02/22/2013 [facility] Patient Information 2015 [place], [place]. This information is not intended to replace advice given to you by your health care provider. Make sure you discuss any questions you have with your health care provider.    Emergency Department Resource Guide 1) Find a Doctor and Pay Out of Pocket Although you won't have to find out who is covered by your insurance plan, it is a good idea to ask around and get recommendations. You will then need to call the office and see if the doctor  you have chosen will accept you as a new patient and what types of options they offer for patients who are self-pay. Some doctors offer discounts or will set up payment plans for their patients who do not have insurance, but you will need to ask so you aren't surprised when you get to your appointment.  2) Contact Your Local Health Department Not all health departments have doctors that can see patients for sick visits, but many do, so it is worth a call to see if yours does. If you don't know where your local health department is, you can check in your phone book. The CDC also has a tool to help you locate your state's health department, and many state websites also have listings of all of their local health departments.  3) Find a Walk-in Clinic If your illness is not likely to be very severe or complicated, you may want to try a walk in clinic. These are popping up all over the country in pharmacies, drugstores, and shopping centers. They're usually staffed by nurse practitioners or physician assistants that have been trained to treat common illnesses and complaints. They're usually fairly quick and inexpensive. However, if you have serious medical issues or chronic medical problems, these are probably not your best option.  No Primary Care Doctor: - Call Health Connect at  [phone] - they can help you locate a primary care doctor that  accepts your insurance, provides certain services, etc. - Physician Referral Service- [phone]  Chronic Pain Problems: Organization         Address  Phone   Notes  [facility]  [phone] Patients need to be referred by their primary care doctor.   Medication Assistance: Organization         Address  Phone   Notes  [facility] Medication [facility] [address] [place] [place], [place] [address] [phone] --Must be a resident of [facility] -- Must have NO insurance coverage whatsoever (no Medicaid/  Medicare, etc.) -- The pt. MUST have a primary care doctor that  directs their care regularly and follows them in the community   [facility]ist  [phone]   [employer]  [phone]    Agencies that provide inexpensive medical care: Organization         Address  Phone   Notes  [facility]  [phone]   [facility]    [phone]   [facility] [address] [place], [place] [address] [phone]   [facility] [address] [place] [place] [phone]   Planned Parenthood    [phone]   [facility]    [phone]   [facility]  [address], [place] Phone:  [phone], Fax:  [phone] Hours of Operation:  9 am - 6 pm, M-F.  Also accepts Medicaid/Medicare and self-pay.  [facility]  [address], [place] Phone: [phone], Fax: [phone]. Hours of Operation:  8:30 am - 5:30 pm, M-F.  Also accepts Medicaid and self-pay.  [facility] [place] [address], [place] Phone: [phone]   [facility] [address] Natasha Bence [place], [place] [phone], Ext. [phone] Mondays & Thursdays: 7-9 AM.  First 15 patients are seen on a first come, first serve basis.    Medicaid-accepting [facility] Providers:  Organization         Address  Phone   Notes  [facility] [address], [place] [phone] Also accepts self-pay patients.  [facility] [address] Laurell Josephs [place], [place]  [phone]   [facility] [address], [place] [phone]   [facility] [address], [place] [phone]   Renaye Rakers [address], [place]   [phone] Only accepts [place] Access [place] patients after they have their name applied to their card.    Self-Pay (no insurance) in [facility]:  Organization         Address  Phone   Notes  Sickle Cell Patients, [facility] Medicine [address] [place], [place] [phone]   [facility] [address] [place], [place] [phone]   [facility] [place]  [address], [place] [phone]   [facility]/Dr. Osei-Bonsu  [address], [place] or [address], [place] [phone] Phone number for both [place] and [place] locations is the same.  [facility] and [facility] [address], [place] [phone]   [facility] [address], [place] or [address] [phone]   [facility] [address], [place] [phone], phone; [phone], fax Sees patients 1st and 3rd Saturday of every month.  Must not qualify for public or private insurance (i.e. Medicaid, Medicare, [place] Health Choice, Veterans' Benefits)  Household income should be no more than 200% of the poverty level The clinic cannot treat you if you are pregnant or think you are pregnant  Sexually transmitted diseases are not treated at the clinic.    Dental Care: Organization         Address  Phone  Notes  [facility] [address] [place], [place] [phone] Accepts children up to  age 59 who are enrolled in Medicaid or [place] Health Choice; pregnant women with a Medicaid card; and children who have applied for Medicaid or [place] Health Choice, but were declined, whose parents can pay a reduced fee at time of service.  [facility] of [facility]  [address], [place] [phone] Accepts children up to age 69 who are enrolled in [place] or [place] Health Choice; pregnant women with a Medicaid card; and children who have applied for Medicaid or [place] Health Choice,  but were declined, whose parents can pay a reduced fee at time of service.  [facility]  [address] [place], [place] [phone] Patients are seen by appointment only. Walk-ins are not accepted. [facility] will see patients 34 years of age and older. Monday - Tuesday (8am-5pm) Most Wednesdays (8:30-5pm) $30 per visit, cash only  [facility]  [address], [facility] [phone] Patients are seen by appointment only. Walk-ins are not accepted. [facility] will see patients 91 years of age and older. One Wednesday Evening (Monthly: Volunteer Based).  $30 per visit, cash only  [employer]  [phone] for adults; Children under age 75, call Graduate Pediatric Dentistry at [phone]. Children aged 28-14, please call [phone] to request a pediatric application.  Dental services are provided in all areas of dental care including fillings, crowns and bridges, complete and partial dentures, implants, gum treatment, root canals, and extractions. Preventive care is also provided. Treatment is provided to both adults and children. Patients are selected via a lottery and there is often a waiting list.   [facility] [address], [place]  [phone] [email or web address]   [facility] [address] [place], [place] [phone], Ext. [phone] Second and Fourth Thursday of each month, opens at 6:30 AM; Clinic ends at 9 AM.  Patients are seen on a first-come first-served basis, and a limited number are seen during each clinic.   [facility]  [address] Ether Griffins [place], [place] [phone]   Eligibility Requirements You must have lived in [place], [place], or [place] counties for at least the last three months.   You cannot be eligible for state or federal sponsored [place], including [employer], [place], or [employer].   You generally  cannot be eligible for healthcare insurance through your employer.    How to apply: Eligibility screenings are held every Tuesday and Wednesday afternoon from 1:00 pm until 4:00 pm. You do not need an appointment for the interview!  [facility] [address], [place], [place] [phone]   [facility] Health Department  [phone]   [facility] Health Department  [phone]   [facility] Health Department  [phone]    Behavioral Health Resources in the Community: Intensive Outpatient Programs Organization         Address  Phone  Notes  [facility] [address] [place], [place] [phone]   [facility] Outpatient [address] [place], [place] [phone]   [facility] [address] [place], [place]  [phone]   [facility] Mental Health [address],  [place], [place] [phone] or [phone]   Substance Abuse Resources Organization         Address  Phone  Notes  [facility]  [phone]   [facility]  [phone]   [facility]  [phone]   Floydene Flock  [phone]   Residential & Outpatient Substance Abuse Program  [phone]   Psychological Services Organization         Address  Phone  Notes  [facility]  [phone]   [facility]  [phone]   [facility] [address] [place] [phone] or [phone]    Mobile Crisis Teams Organization         Address  Phone  Notes  [facility], Mobile Crisis Care Unit  [phone]   [facility]  [address] [place], [place] [phone]   Doristine Locks [address] [place] [phone]    Self-Help/Support Groups Organization         Address  Phone             Notes  [facility] [place] - variety of support groups  [phone]- [phone] Call for more  information  Narcotics Anonymous (NA), [facility] [address], [employer] [place]  2 meetings at this location   Statistician         Address  Phone  Notes  [facility] [address],    [place]  [phone]   [facility]  [address] [place] [address], [place], [place] [phone]   [facility] [address] [place], [place] [phone] Admissions: 8am-3pm M-F  [facility] [address]    [place], [place] [phone]   [facility] [address] [place], [place], [place] [phone]   [facility] [address]  [place], [place] [phone]   [facility] - Intensive Outpatient [address] Laurell Josephs [address], [place], [place] [phone]   [facility] ([facility] [address]  [place], [place] [phone] or [phone]   [facility] ([facility]) [address], [place], [place] [phone] Accepts Medicaid  [facility] [address]  [place] [phone] Substance Abuse/Addiction Treatment   [facility] Organization         Address  Phone  Notes  [facility]  [phone]   Angie Fava, PhD [address] Ervin Knack [place], [place]   [phone] or [phone]   [facility]   [address] [place], [place] [phone]   [facility] [address] [place], [place] [phone] Insurance/Medicaid/sponsorship through [facility] and Families [address]                                    [place], [place] [phone] Therapy/tele-psych/case  [facility] [address], [place] [phone]    Dr. Lolly Mustache  [phone]   [facility]  [employer] [facility] Dept. 1) [address], [place] 2) [address] [place] 3)  [address], [place] [phone]  [phone]   [facility] Child Abuse Hotline [phone] or [phone] (After Hours)

## 2015-01-23 ENCOUNTER — Emergency Department (HOSPITAL_COMMUNITY): Payer: No Typology Code available for payment source

## 2015-01-23 ENCOUNTER — Emergency Department (HOSPITAL_COMMUNITY)
Admission: EM | Admit: 2015-01-23 | Discharge: 2015-01-23 | Disposition: A | Discharge status: Home / Self Care | Payer: No Typology Code available for payment source | Attending: Emergency Medicine | Admitting: Emergency Medicine

## 2015-01-23 ENCOUNTER — Encounter (HOSPITAL_COMMUNITY): Payer: Self-pay | Admitting: Emergency Medicine

## 2015-01-23 DIAGNOSIS — Z79899 Other long term (current) drug therapy: Secondary | ICD-10-CM | POA: Insufficient documentation

## 2015-01-23 DIAGNOSIS — I10 Essential (primary) hypertension: Secondary | ICD-10-CM | POA: Insufficient documentation

## 2015-01-23 DIAGNOSIS — R079 Chest pain, unspecified: Secondary | ICD-10-CM | POA: Insufficient documentation

## 2015-01-23 DIAGNOSIS — M542 Cervicalgia: Secondary | ICD-10-CM | POA: Insufficient documentation

## 2015-01-23 LAB — BASIC METABOLIC PANEL
Anion gap: 8 (ref 5–15)
BUN: 12 mg/dL (ref 6–20)
CO2: 22 mmol/L (ref 22–32)
Calcium: 9.2 mg/dL (ref 8.9–10.3)
Chloride: 107 mmol/L (ref 101–111)
Creatinine, Ser: 0.85 mg/dL (ref 0.44–1.00)
GFR calc Af Amer: >60 mL/min (ref >60)
GFR calc non Af Amer: >60 mL/min (ref >60)
Glucose, Bld: 104 mg/dL — ABNORMAL HIGH (ref 65–99)
Potassium: 4.1 mmol/L (ref 3.5–5.1)
Sodium: 137 mmol/L (ref 135–145)

## 2015-01-23 LAB — CBC
HCT: 42.1 % (ref 36.0–46.0)
Hemoglobin: 13.8 g/dL (ref 12.0–15.0)
MCH: 30.9 pg (ref 26.0–34.0)
MCHC: 32.8 g/dL (ref 30.0–36.0)
MCV: 94.4 fL (ref 78.0–100.0)
Platelets: 277 10*3/uL (ref 150–400)
RBC: 4.46 MIL/uL (ref 3.87–5.11)
RDW: 13.6 % (ref 11.5–15.5)
WBC: 8.3 10*3/uL (ref 4.0–10.5)

## 2015-01-23 LAB — I-STAT TROPONIN, ED: Troponin i, poc: 0 ng/mL (ref 0.00–0.08)

## 2015-01-23 MED ORDER — CYCLOBENZAPRINE HCL 10 MG PO TABS: 10.0000 mg | ORAL_TABLET | Freq: Three times a day (TID) | ORAL | Status: DC | PRN

## 2015-01-23 MED ORDER — MORPHINE SULFATE (PF) 4 MG/ML IV SOLN
6.0000 mg | Freq: Once | INTRAVENOUS | Status: AC
  Administered 2015-01-23: 6 mg via INTRAVENOUS
  Filled 2015-01-23: qty 2

## 2015-01-23 MED ORDER — LORAZEPAM 2 MG/ML IJ SOLN
1.0000 mg | Freq: Once | INTRAMUSCULAR | Status: AC
  Administered 2015-01-23: 1 mg via INTRAVENOUS
  Filled 2015-01-23: qty 1

## 2015-01-23 MED ORDER — TRAMADOL HCL 50 MG PO TABS: 50.0000 mg | ORAL_TABLET | Freq: Four times a day (QID) | ORAL | Status: DC | PRN

## 2015-01-23 MED ORDER — PROMETHAZINE HCL 25 MG/ML IJ SOLN
12.5000 mg | Freq: Once | INTRAMUSCULAR | Status: AC
  Administered 2015-01-23: 12.5 mg via INTRAVENOUS
  Filled 2015-01-23: qty 1

## 2015-01-23 MED ORDER — AMLODIPINE BESYLATE 5 MG PO TABS: 10.0000 mg | ORAL_TABLET | Freq: Every day | ORAL | Status: DC

## 2015-01-23 MED ORDER — KETOROLAC TROMETHAMINE 15 MG/ML IJ SOLN
15.0000 mg | Freq: Once | INTRAMUSCULAR | Status: AC
  Administered 2015-01-23: 15 mg via INTRAVENOUS
  Filled 2015-01-23: qty 1

## 2015-01-23 NOTE — ED Provider Notes (Signed)
CSN: [id number]     Arrival date & time 01/23/15  4098 History   First MD Initiated Contact with Patient 01/23/15 [phone]     Chief Complaint  Patient presents with  . Chest Pain  . Nausea     (Consider location/radiation/quality/duration/timing/severity/associated sxs/prior Treatment) HPI   44 year old female with chest pain. Pain has been waxing and waning over the past week. Pain is primarily on the left side, but occasionally "switches" to the right. Worse with certain movements. No appreciable exacerbating relieving factors otherwise. No respiratory complaints. No fevers or chills. Patient with multiple other complaints including occasional nausea, excessive sweating which has been going on for years epigastric cramps. History CAD. Never smoker. No unusual leg pain or swelling.  History reviewed. No pertinent past medical history. History reviewed. No pertinent past surgical history. History reviewed. No pertinent family history. Social History  Substance Use Topics  . Smoking status: Never Smoker   . Smokeless tobacco: None  . Alcohol Use: 3.6 oz/week    4 Shots of liquor, 2 Glasses of wine per week   OB History    No data available     Review of Systems  All systems reviewed and negative, other than as noted in HPI.   Allergies  Review of patient's allergies indicates no known allergies.  Home Medications   Prior to Admission medications   Medication Sig Start Date End Date Taking? Authorizing Provider  butalbital-acetaminophen-caffeine (FIORICET) 50-325-40 MG per tablet Take 1-2 tablets by mouth every 6 (six) hours as needed for headache. 01/07/15 01/07/16  Layla Maw Ward, DO  HYDROcodone-acetaminophen (NORCO) 5-325 MG per tablet Take 1 tablet by mouth every 4 (four) hours as needed for pain. 09/15/12   Marissa Sciacca, PA-C  HYDROcodone-acetaminophen (NORCO) 5-325 MG per tablet Take 1 tablet by mouth every 4 (four) hours as needed. 01/07/14   Mancel Bale, MD   pantoprazole (PROTONIX) 20 MG tablet Take 1 tablet (20 mg total) by mouth daily. 01/07/14   Mancel Bale, MD  promethazine (PHENERGAN) 25 MG tablet Take 1 tablet (25 mg total) by mouth every 6 (six) hours as needed for nausea or vomiting. 01/07/15   Kristen N Ward, DO   BP 174/96 mmHg  Pulse 85  Temp(Src) 98.7 F (37.1 C) (Oral)  Resp 18  SpO2 100%  LMP 01/03/2015 Physical Exam  Constitutional: She appears well-developed and well-nourished. No distress.  HENT:  Head: Normocephalic and atraumatic.  Eyes: Conjunctivae are normal. Right eye exhibits no discharge. Left eye exhibits no discharge.  Neck: Neck supple.  Cardiovascular: Normal rate, regular rhythm and normal heart sounds.  Exam reveals no gallop and no friction rub.   No murmur heard. Pulmonary/Chest: Effort normal and breath sounds normal. No respiratory distress.  Abdominal: Soft. She exhibits no distension. There is no tenderness.  Musculoskeletal: She exhibits no edema or tenderness.  Lower extremities symmetric as compared to each other. No calf tenderness. Negative Homan's. No palpable cords.   Neurological: She is alert.  Skin: Skin is warm and dry.  Psychiatric: She has a normal mood and affect. Her behavior is normal. Thought content normal.  Nursing note and vitals reviewed.   ED Course  Procedures (including critical care time) Labs Review Labs Reviewed  BASIC METABOLIC PANEL - Abnormal; Notable for the following:    Glucose, Bld 104 (*)    All other components within normal limits  CBC  I-STAT TROPOININ, ED    Imaging Review Dg Chest 2 View  01/23/2015  CLINICAL DATA:  Chest pain. Upper back pain radiating to the RIGHT shoulder. Mid chest discomfort. Intermittent symptoms for 1 week.  EXAM: CHEST  2 VIEW  COMPARISON:  None.  FINDINGS: Cardiopericardial silhouette within normal limits. Mediastinal contours normal. Trachea midline. No airspace disease or effusion. Monitoring leads project over the chest.   IMPRESSION: No active cardiopulmonary disease.   Electronically Signed   By: Andreas Newport M.D.   On: 01/23/2015 10:03   I have personally reviewed and evaluated these images and lab results as part of my medical decision-making.   EKG Interpretation   Date/Time:  Friday January 23 2015 08:52:49 EDT Ventricular Rate:  77 PR Interval:  152 QRS Duration: 63 QT Interval:  360 QTC Calculation: 407 R Axis:   56 Text Interpretation:  Sinus rhythm Nonspecific T abnormalities, anterior  leads Confirmed by Lorenza Winkleman  MD, Brycelyn Gambino ([id number]) on 01/23/2015 10:34:25 AM      MDM   Final diagnoses:  Neck pain  Chest pain, unspecified chest pain type  Essential hypertension    44yF with neck/arm/chest pain. Very reproducible with palpation and movement. Consider ACS but doubt. Consider dissection, particularly with HTN but low suspicion for this as well. CXR with no concerning findings. Doubt PE. Pt denies past diagnosis of HTN but no regular medical care and several visits dating back over a year and hypertensive on each evaluation. Will start on norvasc. Symptomatic tx of likely muscular etiology.     Raeford Razor, MD 02/06/15 [phone]

## 2015-01-23 NOTE — ED Notes (Signed)
Pt complaining of chest pain that "switches sides" occasionally and has been coming and going over the last week. States at this moment it's in the right side of her neck and into her back on the right side. Also complaining of epigastric cramps, nausea, and occasional excessive sweating. Denies SOB/dizziness. States last time she had a similar problem they contributed it to her high blood pressure, pressure 174/96 in triage.

## 2015-01-23 NOTE — Discharge Instructions (Signed)
Chest Pain (Nonspecific) °It is often hard to give a specific diagnosis for the cause of chest pain. There is always a chance that your pain could be related to something serious, such as a heart attack or a blood clot in the lungs. You need to follow up with your health care provider for further evaluation. °CAUSES  °· Heartburn. °· Pneumonia or bronchitis. °· Anxiety or stress. °· Inflammation around your heart (pericarditis) or lung (pleuritis or pleurisy). °· A blood clot in the lung. °· A collapsed lung (pneumothorax). It can develop suddenly on its own (spontaneous pneumothorax) or from trauma to the chest. °· Shingles infection (herpes zoster virus). °The chest wall is composed of bones, muscles, and cartilage. Any of these can be the source of the pain. °· The bones can be bruised by injury. °· The muscles or cartilage can be strained by coughing or overwork. °· The cartilage can be affected by inflammation and become sore (costochondritis). °DIAGNOSIS  °Lab tests or other studies may be needed to find the cause of your pain. Your health care provider may have you take a test called an ambulatory electrocardiogram (ECG). An ECG records your heartbeat patterns over a 24-hour period. You may also have other tests, such as: °· Transthoracic echocardiogram (TTE). During echocardiography, sound waves are used to evaluate how blood flows through your heart. °· Transesophageal echocardiogram (TEE). °· Cardiac monitoring. This allows your health care provider to monitor your heart rate and rhythm in real time. °· Holter monitor. This is a portable device that records your heartbeat and can help diagnose heart arrhythmias. It allows your health care provider to track your heart activity for several days, if needed. °· Stress tests by exercise or by giving medicine that makes the heart beat faster. °TREATMENT  °· Treatment depends on what may be causing your chest pain. Treatment may include: °· Acid blockers for  heartburn. °· Anti-inflammatory medicine. °· Pain medicine for inflammatory conditions. °· Antibiotics if an infection is present. °· You may be advised to change lifestyle habits. This includes stopping smoking and avoiding alcohol, caffeine, and chocolate. °· You may be advised to keep your head raised (elevated) when sleeping. This reduces the chance of acid going backward from your stomach into your esophagus. °Most of the time, nonspecific chest pain will improve within 2-3 days with rest and mild pain medicine.  °HOME CARE INSTRUCTIONS  °· If antibiotics were prescribed, take them as directed. Finish them even if you start to feel better. °· For the next few days, avoid physical activities that bring on chest pain. Continue physical activities as directed. °· Do not use any tobacco products, including cigarettes, chewing tobacco, or electronic cigarettes. °· Avoid drinking alcohol. °· Only take medicine as directed by your health care provider. °· Follow your health care provider's suggestions for further testing if your chest pain does not go away. °· Keep any follow-up appointments you made. If you do not go to an appointment, you could develop lasting (chronic) problems with pain. If there is any problem keeping an appointment, call to reschedule. °SEEK MEDICAL CARE IF:  °· Your chest pain does not go away, even after treatment. °· You have a rash with blisters on your chest. °· You have a fever. °SEEK IMMEDIATE MEDICAL CARE IF:  °· You have increased chest pain or pain that spreads to your arm, neck, jaw, back, or abdomen. °· You have shortness of breath. °· You have an increasing cough, or you cough   up blood. °· You have severe back or abdominal pain. °· You feel nauseous or vomit. °· You have severe weakness. °· You faint. °· You have chills. °This is an emergency. Do not wait to see if the pain will go away. Get medical help at once. Call your local emergency services (911 in [place] Do not drive  yourself to the hospital. °MAKE SURE YOU:  °· Understand these instructions. °· Will watch your condition. °· Will get help right away if you are not doing well or get worse. °Document Released: 02/09/2005 Document Revised: 05/07/2013 Document Reviewed: 12/06/2007 °ExitCare® Patient Information ©2015 [employer]. This information is not intended to replace advice given to you by your health care provider. Make sure you discuss any questions you have with your health care provider. ° ° °Emergency Department Resource Guide °1) Find a Doctor and Pay Out of Pocket °Although you won't have to find out who is covered by your insurance plan, it is a good idea to ask around and get recommendations. You will then need to call the office and see if the doctor you have chosen will accept you as a new patient and what types of options they offer for patients who are self-pay. Some doctors offer discounts or will set up payment plans for their patients who do not have insurance, but you will need to ask so you aren't surprised when you get to your appointment. ° °2) Contact Your Local Health Department °Not all health departments have doctors that can see patients for sick visits, but many do, so it is worth a call to see if yours does. If you don't know where your local health department is, you can check in your phone book. The CDC also has a tool to help you locate your state's health department, and many state websites also have listings of all of their local health departments. ° °3) Find a Walk-in Clinic °If your illness is not likely to be very severe or complicated, you may want to try a walk in clinic. These are popping up all over the country in pharmacies, drugstores, and shopping centers. They're usually staffed by nurse practitioners or physician assistants that have been trained to treat common illnesses and complaints. They're usually fairly quick and inexpensive. However, if you have serious medical issues or  chronic medical problems, these are probably not your best option. ° °No Primary Care Doctor: °- Call Health Connect at  832-8000 - they can help you locate a primary care doctor that  accepts your insurance, provides certain services, etc. °- Physician Referral Service- 1-800-533-3463 ° °Chronic Pain Problems: °Organization         Address  Phone   Notes  °[facility]  ([phone]) 297-2271 Patients need to be referred by their primary care doctor.  ° °Medication Assistance: °Organization         Address  Phone   Notes  °[place] [facility] [address] °[place], [place] [address] (336) 641-8030 --Must be a resident of [place] °-- Must have NO insurance coverage whatsoever (no Medicaid/ Medicare, etc.) °-- The pt. MUST have a primary care doctor that directs their care regularly and follows them in the community °  °[facility]  (866) 331-1348   °[employer] Way  (888) 892-1162   ° °Agencies that provide inexpensive medical care: °Organization         Address  Phone   Notes  °[facility]  (  336) 832-8035   °[facility]    (336) 832-7272   °[facility] [address] °[place], [place] [address] (336) 832-4777   °[facility] [address], °[place] (336) 271-4999   °[facility]    (336) 373-0678   °[facility]    (336) 272-1050   °[facility] ° [address], [place] Phone:  (336) 832-4444, Fax:  (336) 832-4440 Hours of Operation:  9 am - 6 pm, M-F.  Also accepts Medicaid/Medicare and self-pay.  °[facility] ° [address], [place] Phone: (336) [phone], Fax: (336) [phone]. Hours of Operation:  8:30 am - 5:30 pm, M-F.  Also accepts Medicaid and self-pay.  °[facility] [place] [address], [place] Phone: (336) 878-6027   °[facility] [address], [place], [place]  (336)723-1848, Ext. 123 Mondays & Thursdays: 7-9 AM.  First 15 patients are seen on a first come, first serve basis. °  ° °Medicaid-accepting [place] Providers: ° °Organization         Address  Phone   Notes  °[facility] [address], [place] (336) 641-2100 Also accepts self-pay patients.  °[facility] [address], [place] ° (336) 856-9996   °[facility] [address], [place] (336) 288-8857   °[facility] [address] [place] [address], [place] (336) 299-7000   °Veita Bland [address], [place]  ° (336) 373-1557 Only accepts [place] Access Medicaid patients after they have their name applied to their card.  ° °Self-Pay (no insurance) in [place]: ° °Organization         Address  Phone   Notes  °Sickle Cell Patients, [facility] [address], [place] (336) 832-1970   °[facility] [address], [place] (336) 832-4400   °[facility] ° [address], [place] (336) 992-4800   °[facility]/Dr. Osei-Bonsu ° [address] [place] [address], [place] or [address], [place] (336) [phone] Phone number for both [place] and [place] locations is the same.  °[facility] [address], [place] (336) [phone]   °[facility] [address] [place] [address], [place] or [address] (336) 852-7530 °(336) 878-2260   °[facility] [address], [place] (336) 350-1642, phone; (336) 294-5005, fax Sees patients 1st and 3rd Saturday of every month.  Must not qualify for public or private insurance (i.e. Medicaid, Medicare, [place] Health Choice, Veterans' Benefits) • Household income should be no more than 200% of the poverty level •The clinic cannot treat you if you are pregnant or think you are pregnant • Sexually transmitted  diseases are not treated at the clinic.  ° ° °Dental Care: °Organization         Address  Phone  Notes  °[place] [facility] [address],  (336) 641-6152 Accepts children up to age 21 who are enrolled in Medicaid or [place] Health Choice; pregnant women with a Medicaid card; and children who have applied for Medicaid or [place] Health Choice, but were declined, whose parents can pay a reduced fee at time of service.  °[place] [facility] [place]    [address], [place] (336) 641-7733 Accepts children up to age 21 who are enrolled in Medicaid or [place] Health Choice; pregnant women with a Medicaid card; and children who have applied for Medicaid or [place] Health Choice, but were declined, whose parents can pay a reduced fee at time of service.  °[facility] ° [address], [place] (336) 641-4533 Patients are seen by appointment only. Walk-ins are not accepted. [facility] will see patients 18 years of age and older. °Monday - Tuesday (8am-5pm) °Most Wednesdays (8:30-5pm) °$30 per visit, cash only  °[facility] ° [address], [place] (336) 641-4533 Patients are seen by appointment only. Walk-ins are not accepted. [facility] will see patients 18 years of age and older. °One Wednesday Evening (Monthly: Volunteer Based).  $30 per visit, cash only  °[facility]  ([phone]) [phone] for adults; Children under age 4, call Graduate Pediatric Dentistry at ([phone]) [phone]. Children aged 4-14, please call ([phone]) [phone] to request a pediatric application. ° Dental services are provided in all areas of dental care including fillings, crowns and bridges, complete and partial dentures, implants, gum treatment, root canals, and extractions. Preventive care is also provided. Treatment is provided to both adults and children. °Patients are selected via a  lottery and there is often a waiting list. °  °[facility] [address], °[place] ° (336) 763-8833 www.drcivils.com °  °[facility] [address], [place], [place] (336)723-1848, Ext. 123 Second and Fourth Thursday of each month, opens at 6:30 AM; Clinic ends at 9 AM.  Patients are seen on a first-come first-served basis, and a limited number are seen during each clinic.  ° °[facility] ° [address], [place], [place] (336) 723-7904   Eligibility Requirements °You must have lived in [place], or [place] counties for at least the last three months. °  You cannot be eligible for state or federal sponsored healthcare insurance, including Veterans Administration, Medicaid, or Medicare. °  You generally cannot be eligible for healthcare insurance through your employer.  °  How to apply: °Eligibility screenings are held every Tuesday and Wednesday afternoon from 1:00 pm until 4:00 pm. You do not need an appointment for the interview!  °[facility] [address], [place], [place] 336-631-2330   °[facility]  336-342-8273   °[facility]  336-703-3100   °[facility]  336-570-6415   ° °Behavioral Health Resources in the Community: °Intensive Outpatient Programs °Organization         Address  Phone  Notes  °[facility] [place] [facility] [address], [place], [place] 336-878-6098   °[facility] [address], [place], [place] 336-832-9800   °[facility] [address], [place], [place] ° 336-882-2125   °[facility] [address],  °[place],  1-800-853-5163 or 336-641-4981   °Substance Abuse Resources °Organization         Address  Phone  Notes  °[facility]  336-882-2125   °[facility]  336-784-9470   °[facility]  336-285-9073   °[facility]  336-845-3988   °Residential &  Outpatient Substance Abuse Program  1-800-659-3381   °Psychological Services °Organization         Address  Phone  Notes  ° Health  336- 832-9600   °  [employer] [employer] [facility]  561-396-6483   [facility] [address], [place] 539-818-8984 or 4192779392    Mobile Crisis Teams Organization         Address  Phone  Notes  [facility], Mobile Crisis Care Unit  406-501-5982   [facility]  [address] [place], [place] 413-244-0102   Doristine Locks [address] [place] 725-366-4403    Self-Help/Support Groups Organization         Address  Phone             Notes  [facility] [place] - variety of support groups  336- I7437963 Call for more information  Narcotics Anonymous (NA), [facility] [address], [employer] [place]  2 meetings at this location   Statistician         Address  Phone  Notes  [facility] [address],    [place]  [phone]   [facility]  [address] [place] [address], [place], [place] [phone]   [facility] [address] [place], [place] [phone] Admissions: 8am-3pm M-F  [facility] [address]    [place], [place] 235-573-2202   [facility] [address] [place], [place], [place] 542-706-2376   [facility] [place] [address],  [place], [place] [phone]   [facility] Intensive Outpatient [address], [place], [place] [phone]   [facility] ([facility] [address] [place]  [place], [place] 4-854-627-0350 or 267-264-5214   [facility] ([facility] [address], [place], [place] 716-967-8938 Accepts Medicaid  [facility] [address],  [place] 1-017-510-2585 Substance Abuse/Addiction Treatment   [facility] Organization          Address  Phone  Notes  [facility]  252-473-2958   Angie Fava, PhD [address] [place] [place], [place]   909-088-1306 or 928-153-3647   [facility]   [address] [place], [place] 240-152-6873   [facility] [address], [place], [place] ([phone]) [phone] Insurance/Medicaid/sponsorship through [facility] [address]                                    [place], [place] [phone] Therapy/tele-psych/case  [facility] [address], [place] (989) [phone]    Dr. Lolly Mustache  ([phone]) [phone]   [facility]  [facility] Dept. 1) [address], [place] 2) [address], [place] 3)  [address], [place] [phone]) [phone]  [phone]   [facility] Child Abuse Hotline 573-527-7712 or 430-221-3746 (After Hours)       Hypertension Hypertension, commonly called high blood pressure, is when the force of blood pumping through your arteries is too strong. Your arteries are the blood vessels that carry blood from your heart throughout your body. A blood pressure reading consists of a higher number over a lower number, such as 110/72. The higher number (systolic) is the pressure inside your arteries when your heart pumps. The lower number (diastolic) is the pressure inside your arteries when your heart relaxes. Ideally you want your blood pressure below 120/80. Hypertension forces your heart to  work harder to pump blood. Your arteries may become narrow or stiff. Having hypertension puts you at risk for heart disease, stroke, and other problems.  RISK FACTORS Some risk factors for high blood pressure are controllable. Others are not.  Risk factors you cannot control include:   Race. You may be at higher risk if you are African American.  Age. Risk increases with age.  Gender. Men are at higher risk than women before age 41 years. After age 69,  women are at higher risk than men. Risk factors you can control include:  Not getting enough exercise or physical activity.  Being overweight.  Getting too much fat, sugar, calories, or salt in your diet.  Drinking too much alcohol. SIGNS AND SYMPTOMS Hypertension does not usually cause signs or symptoms. Extremely high blood pressure (hypertensive crisis) may cause headache, anxiety, shortness of breath, and nosebleed. DIAGNOSIS  To check if you have hypertension, your health care provider will measure your blood pressure while you are seated, with your arm held at the level of your heart. It should be measured at least twice using the same arm. Certain conditions can cause a difference in blood pressure between your right and left arms. A blood pressure reading that is higher than normal on one occasion does not mean that you need treatment. If one blood pressure reading is high, ask your health care provider about having it checked again. TREATMENT  Treating high blood pressure includes making lifestyle changes and possibly taking medicine. Living a healthy lifestyle can help lower high blood pressure. You may need to change some of your habits. Lifestyle changes may include:  Following the DASH diet. This diet is high in fruits, vegetables, and whole grains. It is low in salt, red meat, and added sugars.  Getting at least 2 hours of brisk physical activity every week.  Losing weight if necessary.  Not smoking.  Limiting alcoholic beverages.  Learning ways to reduce stress. If lifestyle changes are not enough to get your blood pressure under control, your health care provider may prescribe medicine. You may need to take more than one. Work closely with your health care provider to understand the risks and benefits. HOME CARE INSTRUCTIONS  Have your blood pressure rechecked as directed by your health care provider.   Take medicines only as directed by your health care provider.  Follow the directions carefully. Blood pressure medicines must be taken as prescribed. The medicine does not work as well when you skip doses. Skipping doses also puts you at risk for problems.   Do not smoke.   Monitor your blood pressure at home as directed by your health care provider. SEEK MEDICAL CARE IF:   You think you are having a reaction to medicines taken.  You have recurrent headaches or feel dizzy.  You have swelling in your ankles.  You have trouble with your vision. SEEK IMMEDIATE MEDICAL CARE IF:  You develop a severe headache or confusion.  You have unusual weakness, numbness, or feel faint.  You have severe chest or abdominal pain.  You vomit repeatedly.  You have trouble breathing. MAKE SURE YOU:   Understand these instructions.  Will watch your condition.  Will get help right away if you are not doing well or get worse. Document Released: 05/02/2005 Document Revised: 09/16/2013 Document Reviewed: 02/22/2013 [facility] Patient Information 2015 [place], [place]. This information is not intended to replace advice given to you by your health care provider. Make sure you discuss any questions  you have with your health care provider.  

## 2016-03-01 ENCOUNTER — Encounter (HOSPITAL_COMMUNITY): Payer: Self-pay | Admitting: *Deleted

## 2016-03-01 ENCOUNTER — Emergency Department (HOSPITAL_COMMUNITY)
Admission: EM | Admit: 2016-03-01 | Discharge: 2016-03-01 | Disposition: A | Discharge status: Home / Self Care | Payer: Self-pay | Attending: Emergency Medicine | Admitting: Emergency Medicine

## 2016-03-01 ENCOUNTER — Emergency Department (HOSPITAL_COMMUNITY): Payer: Self-pay

## 2016-03-01 DIAGNOSIS — Z7982 Long term (current) use of aspirin: Secondary | ICD-10-CM | POA: Insufficient documentation

## 2016-03-01 DIAGNOSIS — R740 Nonspecific elevation of levels of transaminase and lactic acid dehydrogenase [LDH]: Secondary | ICD-10-CM | POA: Insufficient documentation

## 2016-03-01 DIAGNOSIS — R7401 Elevation of levels of liver transaminase levels: Secondary | ICD-10-CM

## 2016-03-01 DIAGNOSIS — K8020 Calculus of gallbladder without cholecystitis without obstruction: Secondary | ICD-10-CM | POA: Insufficient documentation

## 2016-03-01 LAB — URINALYSIS, ROUTINE W REFLEX MICROSCOPIC
Glucose, UA: NEGATIVE mg/dL
Ketones, ur: NEGATIVE mg/dL
Nitrite: NEGATIVE
PH: 6 (ref 5.0–8.0)
Protein, ur: NEGATIVE mg/dL
SPECIFIC GRAVITY, URINE: 1.018 (ref 1.005–1.030)

## 2016-03-01 LAB — COMPREHENSIVE METABOLIC PANEL
ALBUMIN: 4.6 g/dL (ref 3.5–5.0)
ALT: 786 U/L — ABNORMAL HIGH (ref 14–54)
ANION GAP: 9 (ref 5–15)
AST: 1067 U/L — ABNORMAL HIGH (ref 15–41)
Alkaline Phosphatase: 116 U/L (ref 38–126)
BILIRUBIN TOTAL: 2.3 mg/dL — AB (ref 0.3–1.2)
BUN: 11 mg/dL (ref 6–20)
CHLORIDE: 103 mmol/L (ref 101–111)
CO2: 26 mmol/L (ref 22–32)
Calcium: 10.1 mg/dL (ref 8.9–10.3)
Creatinine, Ser: 0.77 mg/dL (ref 0.44–1.00)
GFR calc Af Amer: >60 mL/min (ref >60)
GFR calc non Af Amer: >60 mL/min (ref >60)
GLUCOSE: 103 mg/dL — AB (ref 65–99)
POTASSIUM: 3.8 mmol/L (ref 3.5–5.1)
SODIUM: 138 mmol/L (ref 135–145)
TOTAL PROTEIN: 9 g/dL — AB (ref 6.5–8.1)

## 2016-03-01 LAB — POC URINE PREG, ED: Preg Test, Ur: NEGATIVE

## 2016-03-01 LAB — CBC
HEMATOCRIT: 44.2 % (ref 36.0–46.0)
HEMOGLOBIN: 15.6 g/dL — AB (ref 12.0–15.0)
MCH: 32.3 pg (ref 26.0–34.0)
MCHC: 35.3 g/dL (ref 30.0–36.0)
MCV: 91.5 fL (ref 78.0–100.0)
Platelets: 292 10*3/uL (ref 150–400)
RBC: 4.83 MIL/uL (ref 3.87–5.11)
RDW: 13.7 % (ref 11.5–15.5)
WBC: 8.5 10*3/uL (ref 4.0–10.5)

## 2016-03-01 LAB — URINE MICROSCOPIC-ADD ON

## 2016-03-01 LAB — LIPASE, BLOOD: LIPASE: 25 U/L (ref 11–51)

## 2016-03-01 MED ORDER — IOPAMIDOL (ISOVUE-300) INJECTION 61%
100.0000 mL | Freq: Once | INTRAVENOUS | Status: AC | PRN
  Administered 2016-03-01: 100 mL via INTRAVENOUS

## 2016-03-01 MED ORDER — MORPHINE SULFATE (PF) 4 MG/ML IV SOLN
4.0000 mg | Freq: Once | INTRAVENOUS | Status: AC
  Administered 2016-03-01: 4 mg via INTRAVENOUS
  Filled 2016-03-01: qty 1

## 2016-03-01 MED ORDER — TRAMADOL HCL 50 MG PO TABS: 50.0000 mg | ORAL_TABLET | Freq: Four times a day (QID) | ORAL | 0 refills | Status: DC | PRN

## 2016-03-01 MED ORDER — PROMETHAZINE HCL 25 MG PO TABS: 25.0000 mg | ORAL_TABLET | Freq: Four times a day (QID) | ORAL | 0 refills | Status: DC | PRN

## 2016-03-01 MED ORDER — ONDANSETRON HCL 4 MG/2ML IJ SOLN
4.0000 mg | Freq: Once | INTRAMUSCULAR | Status: AC
  Administered 2016-03-01: 4 mg via INTRAVENOUS
  Filled 2016-03-01: qty 2

## 2016-03-01 NOTE — ED Notes (Signed)
Pt obtained urine sample in triage 

## 2016-03-01 NOTE — ED Notes (Signed)
2 attempts made for blood draw but were unsuccessful.

## 2016-03-01 NOTE — Progress Notes (Signed)
CM spoke with pt who [place]cp.  CM discussed and provided written information to assist pt with determining choice for uninsured accepting pcps, discussed the importance of pcp vs EDP services for f/u care, www.needymeds.org, www.goodrx.com, discounted pharmacies and other [place] resources such as [facility] , [facility], affordable care act, financial assistance, uninsured dental services, [facility] med assist, DSS and  health department  Reviewed resources for [place] uninsured accepting pcps like [facility], family medicine at [address], [facility] [place], [facility], local urgent care centers, [facility], [facility] family practice, general medical clinics, [facility] services of [facility] [place], [facility] urgent care plus others, medication resources, [facility] out patient pharmacies and housing Pt voiced understanding and appreciation of resources provided   Provided [facility] contact information Pt agreed to a referral Cm completed referral to get assist with updating her expired orange card Pt to be contact by [facility] clinical liaison

## 2016-03-01 NOTE — ED Provider Notes (Signed)
[facility]-EMERGENCY DEPT Provider Note   CSN: [id number] Arrival date & time: 03/01/16  1211     History   Chief Complaint Chief Complaint  Patient presents with  . Abdominal Pain    HPI Jessica Garrett is a 45 y.o. female.  HPI  45 year old female presenting with complaints of epigastric abdominal pain. Patient report for the past 2 weeks she has had recurrent pain to her epigastric region that radiates to her right upper quadrant. Pain usually associated after eating especially with fatty food. She also report having increased nausea and has vomited several times today. Vomitus is nonbloody nonbilious. Pains described as a gas pain, rating her pain as 8 out of 10, not adequately improved despite using Prilosec and Tums. Pain does radiates to her mid back and the left shoulder. Patient denies having fever, chills, productive cough, chest pain, shortness of breath, dysuria, hematuria, vaginal bleeding, vaginal discharge, or rash. She denies any recent injury. No recent sick contact, has not had a flu shot. Report prior tubal ligation. No prior abdominal surgery. Patient admits to drinking alcohol on a regular basis but states that she has tried to decrease her alcohol intakes to 1-2 beers in the weekend only. She is a nonsmoker and does not have any significant cardiac history.   History reviewed. No pertinent past medical history.  There are no active problems to display for this patient.   No past surgical history on file.  OB History    No data available       Home Medications    Prior to Admission medications   Medication Sig Start Date End Date Taking? Authorizing Provider  amLODipine (NORVASC) 5 MG tablet Take 2 tablets (10 mg total) by mouth daily. 01/23/15   Raeford Razor, MD  aspirin 500 MG tablet Take 500-1,000 mg by mouth every 6 (six) hours as needed for pain.    Historical Provider, MD  cyclobenzaprine (FLEXERIL) 10 MG tablet Take 1 tablet (10 mg total) by mouth 3 (three)  times daily as needed for muscle spasms. 01/23/15   Raeford Razor, MD  promethazine (PHENERGAN) 25 MG tablet Take 1 tablet (25 mg total) by mouth every 6 (six) hours as needed for nausea or vomiting. 01/07/15   Layla Maw Ward, DO  traMADol (ULTRAM) 50 MG tablet Take 1 tablet (50 mg total) by mouth every 6 (six) hours as needed. 01/23/15   Raeford Razor, MD    Family History No family history on file.  Social History Social History  Substance Use Topics  . Smoking status: Never Smoker  . Smokeless tobacco: Never Used  . Alcohol use Yes     Comment: occasional     Allergies   Review of patient's allergies indicates no known allergies.   Review of Systems Review of Systems  All other systems reviewed and are negative.    Physical Exam Updated Vital Signs BP 173/99   Pulse 97   Temp 98.9 F (37.2 C) (Oral)   Resp 16   Ht 5\' 4"  (1.626 m)   Wt 68.6 kg   LMP 02/08/2016 (Approximate)   SpO2 100%   BMI 25.95 kg/m   Physical Exam  Constitutional: She appears well-developed and well-nourished. No distress.  Patient laying in bed, well-appearing, in no acute discomfort.  HENT:  Head: Atraumatic.  Eyes: Conjunctivae are normal.  Neck: Neck supple.  Cardiovascular: Normal rate and regular rhythm.   Pulmonary/Chest: Effort normal and breath sounds normal. No respiratory distress. She has no wheezes.  She has no rales. She exhibits no tenderness.  Abdominal: Soft. Bowel sounds are normal. She exhibits no distension. There is tenderness (Tenderness to epigastric region without guarding or rebound tenderness. Negative Murphy sign, no pain at McBurney's point and no hepatosplenomegaly.).  Neurological: She is alert.  Skin: No rash noted.  Psychiatric: She has a normal mood and affect.  Nursing note and vitals reviewed.    ED Treatments / Results  Labs (all labs ordered are listed, but only abnormal results are displayed) Labs Reviewed  COMPREHENSIVE METABOLIC PANEL - Abnormal;  Notable for the following:       Result Value   Glucose, Bld 103 (*)    Total Protein 9.0 (*)    AST 1,067 (*)    ALT 786 (*)    Total Bilirubin 2.3 (*)    All other components within normal limits  CBC - Abnormal; Notable for the following:    Hemoglobin 15.6 (*)    All other components within normal limits  URINALYSIS, ROUTINE W REFLEX MICROSCOPIC (NOT AT [facility]) - Abnormal; Notable for the following:    Color, Urine AMBER (*)    APPearance CLOUDY (*)    Hgb urine dipstick MODERATE (*)    Bilirubin Urine SMALL (*)    Leukocytes, UA SMALL (*)    All other components within normal limits  URINE MICROSCOPIC-ADD ON - Abnormal; Notable for the following:    Squamous Epithelial / LPF 0-5 (*)    Bacteria, UA FEW (*)    All other components within normal limits  LIPASE, BLOOD  POC URINE PREG, ED    EKG  EKG Interpretation None       Radiology Ct Abdomen Pelvis W Contrast  Result Date: 03/01/2016 CLINICAL DATA:  45 year old with 2 weeks of cramping epigastric pain each time she eats. Recent vomiting. EXAM: CT ABDOMEN AND PELVIS WITH CONTRAST TECHNIQUE: Multidetector CT imaging of the abdomen and pelvis was performed using the standard protocol following bolus administration of intravenous contrast. CONTRAST:  ISOVUE-300 IOPAMIDOL (ISOVUE-300) INJECTION 61% COMPARISON:  None. FINDINGS: Lower chest: Lung bases are clear.  No pleural effusions. Hepatobiliary: Calcified gallstones in the gallbladder fundus. There is no gallbladder distention or inflammatory changes. Normal appearance of the liver without biliary dilatation. The portal venous system is patent. Pancreas: Normal appearance of the pancreas without inflammation or duct dilatation. Spleen: Normal appearance of spleen without enlargement. Adrenals/Urinary Tract: Normal adrenal glands. Normal appearance of both kidneys without hydronephrosis. Urinary bladder is unremarkable. Stomach/Bowel: Stomach is within normal limits.  Appendix appears normal. No evidence of bowel wall thickening, distention, or inflammatory changes. Vascular/Lymphatic: No significant vascular findings are present. No enlarged abdominal or pelvic lymph nodes. Proximal left gonadal vein is slightly prominent for size but nonspecific. Reproductive: Uterus and bilateral adnexa are unremarkable. Other: Trace fluid in the pelvis is likely physiologic. Musculoskeletal: No acute or significant osseous findings. IMPRESSION: Cholelithiasis. No acute inflammatory changes in the abdomen or pelvis. Electronically Signed   By: Richarda Overlie M.D.   On: 03/01/2016 18:24   US Abdomen Limited  Result Date: 03/01/2016 CLINICAL DATA:  Right upper quadrant pain x2 weeks EXAM: US ABDOMEN LIMITED - RIGHT UPPER QUADRANT COMPARISON:  None. FINDINGS: Gallbladder: Multiple gallstones measuring up to 1.3 cm. Associated gallbladder sludge. No gallbladder wall thickening or pericholecystic fluid. Common bile duct: Diameter: 3 mm Liver: Hyperechoic hepatic parenchyma, suggesting hepatic steatosis. No focal hepatic lesion is seen. IMPRESSION: Cholelithiasis, without associated sonographic findings to suggest acute cholecystitis. Electronically Signed  By: Charline BillsSriyesh  Krishnan M.D.   On: 03/01/2016 17:28    Procedures Procedures (including critical care time)  Medications Ordered in ED Medications  ondansetron (ZOFRAN) injection 4 mg (4 mg Intravenous Given 03/01/16 1715)  morphine 4 MG/ML injection 4 mg (4 mg Intravenous Given 03/01/16 1716)  iopamidol (ISOVUE-300) 61 % injection 100 mL (100 mLs Intravenous Contrast Given 03/01/16 1803)     Initial Impression / Assessment and Plan / ED Course  I have reviewed the triage vital signs and the nursing notes.  Pertinent labs & imaging results that were available during my care of the patient were reviewed by me and considered in my medical decision making (see chart for details).  Clinical Course    BP 152/93   Pulse 89    Temp 98.9 F (37.2 C) (Oral)   Resp 16   Ht 5\' 4"  (1.626 m)   Wt 68.6 kg   LMP 02/08/2016 (Approximate)   SpO2 99%   BMI 25.95 kg/m    Final Clinical Impressions(s) / ED Diagnoses   Final diagnoses:  Calculus of gallbladder without cholecystitis without obstruction  Transaminitis    New Prescriptions New Prescriptions   PROMETHAZINE (PHENERGAN) 25 MG TABLET    Take 1 tablet (25 mg total) by mouth every 6 (six) hours as needed for nausea.   TRAMADOL (ULTRAM) 50 MG TABLET    Take 1 tablet (50 mg total) by mouth every 6 (six) hours as needed.   4:41 PM Patient presents with epigastric pain and right upper quadrant abdominal pain. Pain worsened with eating. Pain suggestive of gallbladder etiology. Plan to obtain upper abdominal limited ultrasound for the evaluation. Lab's concerning for transaminitis. This could suggest obstructive cholecystitis or perhaps due to her history of alcohol abuse. If her ultrasound is normal, I will consider abdominal and pelvis CT scan for further evaluation.  5:54 PM Limited upper abdominal ultrasound demonstrated evidence of cholelithiasis without evidence of cholecystitis. Normal common bile duct.  7:25 PM I discussed with patient the finding on her ultrasound and CT scan. Encouraged patient to follow-up with general surgery for further management of her condition. Patient to avoid fatty food.  I made patient aware of her elevated liver enzyme. She will need to have it rechecked in one week. Patient to avoid alcohol use. Return precaution discussed.   Fayrene HelperBowie Kerline Trahan, PA-C 03/01/16 1926    Nira ConnPedro Eduardo Cardama, MD 03/04/16 [phone]

## 2016-03-01 NOTE — ED Notes (Signed)
PA at bedside.

## 2016-03-01 NOTE — ED Notes (Signed)
Patient transported to CT 

## 2016-03-01 NOTE — Discharge Instructions (Signed)
You have evidence of gallstones which may cause your abdominal discomfort. Eat bland food, avoid food that has fat content as it may worsen your pain.  [facility].  Your liver function is abnormal. Avoid alcohol use and have it recheck by your primary care provider in 1 week.  Return if your condition worsen.

## 2016-03-01 NOTE — ED Triage Notes (Signed)
Patient states for 2 weeks she has a cramping epigastric pain each time she eats.  Patient states she woke up at 2 am today with this pain and has vomited x3 since then. Patient has noticed that this pain and N/V is worse when she eats fatty food.  Patient denies diarrhea and fever.  Patient does not have a PCP and has not sought care for this problem.

## 2016-03-10 ENCOUNTER — Ambulatory Visit: Payer: Self-pay | Admitting: General Surgery

## 2016-03-10 NOTE — H&P (Signed)
History of Present Illness Jessica Garrett(Nalea Salce MD; 03/10/2016 9:22 AM) Patient words: New-Gallbladder.  The patient is a 45 year old female who presents for evaluation of gall stones. The patient is a 45 year old female who is referred by a was similarly ER for evaluation of symptoms and extends. Patient states that she's had several episodes of epigastric abdominal pain. She states that most of these are after eating fatty meals, and alcohol. She does not state that radiates. She states that on her trip to the ER she had chest pain as well. Patient underwent evaluation by the EDP included laboratory tests and radiological studies. CT scan revealed multiple gallstones, no signs of acute inflammation Patient's LFTs were elevated. AST/ALT, 1067/76. T bili was 2.3. Lipase within normal limits alkaline phosphatase 116.     Other Problems Doristine Devoid(Chemira Jones, CMA; 03/10/2016 8:37 AM) Anxiety Disorder Back Pain Cholelithiasis  Past Surgical History Doristine Devoid(Chemira Jones, CMA; 03/10/2016 8:37 AM) No pertinent past surgical history  Diagnostic Studies History Doristine Devoid(Chemira Jones, CMA; 03/10/2016 8:37 AM) Colonoscopy never Mammogram never  Allergies Doristine Devoid(Chemira Jones, CMA; 03/10/2016 8:39 AM) No Known Drug Allergies10/26/2017  Medication History Doristine Devoid(Chemira Jones, CMA; 03/10/2016 8:39 AM) TraMADol HCl (50MG  Tablet, Oral) Active. Promethazine HCl (25MG  Tablet, Oral) Active. Medications Reconciled  Social History Doristine Devoid(Chemira Jones, CMA; 03/10/2016 8:37 AM) Alcohol use Moderate alcohol use. No caffeine use No drug use Tobacco use Never smoker.  Family History Doristine Devoid(Chemira Jones, CMA; 03/10/2016 8:37 AM) Diabetes Mellitus Brother. Heart Disease Mother. Heart disease in female family member before age 47 Hypertension Brother, Father, Mother.  Pregnancy / Birth History Doristine Devoid(Chemira Jones, CMA; 03/10/2016 8:37 AM) Age at menarche 11 years. Contraceptive History Oral contraceptives. Gravida  3 Length (months) of breastfeeding 3-6 Maternal age 10615-20 Para 3 Regular periods    Review of Systems Doristine Devoid(Chemira Jones CMA; 03/10/2016 8:37 AM) General Present- Appetite Loss and Fatigue. Not Present- Chills, Fever, Night Sweats, Weight Gain and Weight Loss. Skin Not Present- Change in Wart/Mole, Dryness, Hives, Jaundice, New Lesions, Non-Healing Wounds, Rash and Ulcer. HEENT Not Present- Earache, Hearing Loss, Hoarseness, Nose Bleed, Oral Ulcers, Ringing in the Ears, Seasonal Allergies, Sinus Pain, Sore Throat, Visual Disturbances, Wears glasses/contact lenses and Yellow Eyes. Respiratory Not Present- Bloody sputum, Chronic Cough, Difficulty Breathing, Snoring and Wheezing. Breast Not Present- Breast Mass, Breast Pain, Nipple Discharge and Skin Changes. Cardiovascular Not Present- Chest Pain, Difficulty Breathing Lying Down, Leg Cramps, Palpitations, Rapid Heart Rate, Shortness of Breath and Swelling of Extremities. Gastrointestinal Present- Abdominal Pain, Bloating, Constipation, Excessive gas, Nausea and Vomiting. Not Present- Bloody Stool, Change in Bowel Habits, Chronic diarrhea, Difficulty Swallowing, Gets full quickly at meals, Hemorrhoids, Indigestion and Rectal Pain. Female Genitourinary Present- Nocturia. Not Present- Frequency, Painful Urination, Pelvic Pain and Urgency. Musculoskeletal Present- Back Pain. Not Present- Joint Pain, Joint Stiffness, Muscle Pain, Muscle Weakness and Swelling of Extremities. Neurological Not Present- Decreased Memory, Fainting, Headaches, Numbness, Seizures, Tingling, Tremor, Trouble walking and Weakness. Psychiatric Present- Change in Sleep Pattern. Not Present- Anxiety, Bipolar, Depression, Fearful and Frequent crying. Endocrine Present- Hot flashes. Not Present- Cold Intolerance, Excessive Hunger, Hair Changes, Heat Intolerance and New Diabetes. Hematology Present- Easy Bruising. Not Present- Blood Thinners, Excessive bleeding, Gland problems, HIV  and Persistent Infections.  Vitals (Chemira Jones CMA; 03/10/2016 8:38 AM) 03/10/2016 8:37 AM Weight: 147.6 lb Height: 64in Body Surface Area: 1.72 m Body Mass Index: 25.34 kg/m  Temp.: 98.56F(Oral)  Pulse: 80 (Regular)  BP: 118/78 (Sitting, Left Arm, Standard)       Physical Exam Jessica Garrett(La Dibella, MD;  03/10/2016 9:23 AM) General Mental Status-Alert. General Appearance-Consistent with stated age. Hydration-Well hydrated. Voice-Normal.  Head and Neck Head-normocephalic, atraumatic with no lesions or palpable masses.  Eye Eyeball - Bilateral-Extraocular movements intact. Sclera/Conjunctiva - Bilateral-No scleral icterus.  Chest and Lung Exam Chest and lung exam reveals -quiet, even and easy respiratory effort with no use of accessory muscles. Inspection Chest Wall - Normal. Back - normal.  Cardiovascular Cardiovascular examination reveals -normal heart sounds, regular rate and rhythm with no murmurs.  Abdomen Inspection Normal Exam - No Hernias. Palpation/Percussion Normal exam - Soft, Non Tender, No Rebound tenderness, No Rigidity (guarding) and No hepatosplenomegaly. Auscultation Normal exam - Bowel sounds normal.  Neurologic Neurologic evaluation reveals -alert and oriented x 3 with no impairment of recent or remote memory. Mental Status-Normal.  Musculoskeletal Normal Exam - Left-Upper Extremity Strength Normal and Lower Extremity Strength Normal. Normal Exam - Right-Upper Extremity Strength Normal, Lower Extremity Weakness.    Assessment & Plan Jessica Filler MD; 03/10/2016 9:23 AM) SYMPTOMATIC CHOLELITHIASIS (K80.20) Impression: 45 year old female with symptomatic cholelithiasis  1. We will proceed to the operating room for a laparoscopic cholecystectomy 2. Risks and benefits were discussed with the patient to generally include, but not limited to: infection, bleeding, possible need for post op ERCP, damage to  the bile ducts, bile leak, and possible need for further surgery. Alternatives were offered and described. All questions were answered and the patient voiced understanding of the procedure and wishes to proceed at this point with a laparoscopic cholecystectomy

## 2016-04-11 ENCOUNTER — Encounter: Payer: Self-pay | Admitting: Family Medicine

## 2016-04-11 ENCOUNTER — Ambulatory Visit (INDEPENDENT_AMBULATORY_CARE_PROVIDER_SITE_OTHER): Payer: Self-pay | Admitting: Family Medicine

## 2016-04-11 VITALS — BP 150/77 | HR 75 | Temp 97.6°F | Resp 16 | Ht 64.0 in | Wt 149.0 lb

## 2016-04-11 DIAGNOSIS — Z23 Encounter for immunization: Secondary | ICD-10-CM

## 2016-04-11 DIAGNOSIS — Z7689 Persons encountering health services in other specified circumstances: Secondary | ICD-10-CM

## 2016-04-11 DIAGNOSIS — Z131 Encounter for screening for diabetes mellitus: Secondary | ICD-10-CM

## 2016-04-11 DIAGNOSIS — I10 Essential (primary) hypertension: Secondary | ICD-10-CM

## 2016-04-11 DIAGNOSIS — Z0000 Encounter for general adult medical examination without abnormal findings: Secondary | ICD-10-CM

## 2016-04-11 LAB — LIPID PANEL
CHOL/HDL RATIO: 3.4 ratio (ref <5.0)
Cholesterol: 182 mg/dL (ref <200)
HDL: 54 mg/dL (ref >50)
LDL Cholesterol: 108 mg/dL — ABNORMAL HIGH (ref <100)
Triglycerides: 102 mg/dL (ref <150)
VLDL: 20 mg/dL (ref <30)

## 2016-04-11 LAB — COMPLETE METABOLIC PANEL WITH GFR
ALT: 58 U/L — ABNORMAL HIGH (ref 6–29)
AST: 41 U/L — AB (ref 10–35)
Albumin: 4.2 g/dL (ref 3.6–5.1)
Alkaline Phosphatase: 62 U/L (ref 33–115)
BUN: 11 mg/dL (ref 7–25)
CHLORIDE: 104 mmol/L (ref 98–110)
CO2: 27 mmol/L (ref 20–31)
Calcium: 9.8 mg/dL (ref 8.6–10.2)
Creat: 0.78 mg/dL (ref 0.50–1.10)
GFR, Est African American: >89 mL/min (ref >=60)
GFR, Est Non African American: >89 mL/min (ref >=60)
GLUCOSE: 108 mg/dL — AB (ref 65–99)
POTASSIUM: 4.5 mmol/L (ref 3.5–5.3)
SODIUM: 138 mmol/L (ref 135–146)
Total Bilirubin: 0.3 mg/dL (ref 0.2–1.2)
Total Protein: 7.1 g/dL (ref 6.1–8.1)

## 2016-04-11 LAB — HEMOGLOBIN A1C
HEMOGLOBIN A1C: 5.8 % — AB (ref <5.7)
MEAN PLASMA GLUCOSE: 120 mg/dL

## 2016-04-11 MED ORDER — AMLODIPINE BESYLATE 5 MG PO TABS: 10.0000 mg | ORAL_TABLET | Freq: Every day | ORAL | 1 refills | Status: DC

## 2016-04-11 NOTE — Progress Notes (Signed)
Jessica Garrett, is a 45 y.o. female  [id number]  [id number]  DOB - 09/08/1970  CC:  Chief Complaint  Patient presents with  . Establish Care  . Abdominal Pain    due to gall stones. Has surgery date 04/26/2016.        HPI: Jessica Garrett is a 45 y.o. female here to establish care. She has a history of hypertension and has been on amlodipine 5 in past but not currently. She was seen recently in ED and diagnosed with Gallstones and is schedules for surgery on December 12. She denies any other chronic illnesses.   Health maintenance: She is in need of PAP and mammogram. She declines flu shot today, but will receive Tdap.    No Known Allergies History reviewed. No pertinent past medical history. Current Outpatient Prescriptions on File Prior to Visit  Medication Sig Dispense Refill  . promethazine (PHENERGAN) 25 MG tablet Take 1 tablet (25 mg total) by mouth every 6 (six) hours as needed for nausea. 20 tablet 0  . traMADol (ULTRAM) 50 MG tablet Take 1 tablet (50 mg total) by mouth every 6 (six) hours as needed. 15 tablet 0  . amLODipine (NORVASC) 5 MG tablet Take 2 tablets (10 mg total) by mouth daily. (Patient not taking: Reported on 04/11/2016) 30 tablet 0  . aspirin 325 MG EC tablet Take 325 mg by mouth every 6 (six) hours as needed for pain.    . cyclobenzaprine (FLEXERIL) 10 MG tablet Take 1 tablet (10 mg total) by mouth 3 (three) times daily as needed for muscle spasms. (Patient not taking: Reported on 04/11/2016) 12 tablet 0  . omeprazole (PRILOSEC) 20 MG capsule Take 20 mg by mouth daily.     No current facility-administered medications on file prior to visit.    Family History  Problem Relation Age of Onset  . Heart disease Mother   . Hypertension Father   . Diabetes Brother   . Hypertension Brother   . Diabetes Maternal Grandmother    Social History   Social History  . Marital status: Single    Spouse name: N/A  . Number of children: N/A  . Years of  education: N/A   Occupational History  . Not on file.   Social History Main Topics  . Smoking status: Never Smoker  . Smokeless tobacco: Never Used  . Alcohol use Yes     Comment: occasional  . Drug use: No  . Sexual activity: Yes    Birth control/ protection: None   Other Topics Concern  . Not on file   Social History Narrative  . No narrative on file    Review of Systems: Constitutional: Negative Skin: Negative HENT: Negative  Eyes: Negative  Neck: Negative Respiratory: Negative Cardiovascular: Negative Gastrointestinal: + for RUQ pain with some nausea.  Genitourinary: Negative  Musculoskeletal: Negative   Neurological: + for occ headaches Hematological: Reports easy bruising Psychiatric/Behavioral: Negative    Objective:   Vitals:   04/11/16 0913  BP: (!) 150/77  Pulse: 75  Resp: 16  Temp: 97.6 F (36.4 C)    Physical Exam: Constitutional: Patient appears well-developed and well-nourished. No distress. HENT: Normocephalic, atraumatic, External right and left ear normal. Oropharynx is clear and moist.  Eyes: Conjunctivae and EOM are normal. PERRLA, no scleral icterus. Neck: Normal ROM. Neck supple. No lymphadenopathy, No thyromegaly. CVS: RRR, S1/S2 +, no murmurs, no gallops, no rubs Pulmonary: Effort and breath sounds normal, no stridor, rhonchi, wheezes, rales.  Abdominal: Soft.  Normoactive BS,, no distension, rebound or guarding. RUQ tenderness present Musculoskeletal: Normal range of motion. No edema and no tenderness.  Neuro: Alert.Normal muscle tone coordination. Non-focal Skin: Skin is warm and dry. No rash noted. Not diaphoretic. No erythema. No pallor. Psychiatric: Normal mood and affect. Behavior, judgment, thought content normal.  Lab Results  Component Value Date   WBC 8.5 03/01/2016   HGB 15.6 (H) 03/01/2016   HCT 44.2 03/01/2016   MCV 91.5 03/01/2016   PLT 292 03/01/2016   Lab Results  Component Value Date   CREATININE 0.77  03/01/2016   BUN 11 03/01/2016   NA 138 03/01/2016   K 3.8 03/01/2016   CL 103 03/01/2016   CO2 26 03/01/2016    No results found for: HGBA1C Lipid Panel  No results found for: CHOL, TRIG, HDL, CHOLHDL, VLDL, LDLCALC      Assessment and plan:   1. Essential hypertension  - COMPLETE METABOLIC PANEL WITH GFR - Lipid panel  2. Encounter to establish care  - Have reviewed information presented by the patient and pertient informtion in the chart.  3. Healthcare maintenance   4. Screening for diabetes mellitus  - Hemoglobin A1c   Return in about 3 months (around 07/12/2016) for HTN, come back in January for PAP.Marland Kitchen.  The patient was given clear instructions to go to ER or return to medical center if symptoms don't improve, worsen or new problems develop. The patient verbalized understanding.    Henrietta HooverLinda C Nickson Middlesworth FNP  04/11/2016, 10:55 AM

## 2016-04-11 NOTE — Patient Instructions (Signed)
Start Amlodipine 5 mg daily. Will let you know if you need to start anyother medictions Get information on how to apply for scholarship for mammogram Come back in January for a PAP smear.

## 2016-04-19 NOTE — Pre-Procedure Instructions (Signed)
Jessica Garrett  04/19/2016      [facility] [address], [place] - [id number] [place] [id number] Phone: [phone] Fax: [phone]    Your procedure is scheduled on Tues, Dec 12 @ 7:30 AM  [facility] at 5:30 AM  Call this number if you have problems the morning of surgery:  [phone]   Remember:  Do not eat food or drink liquids after midnight.  Take these medicines the morning of surgery with A SIP OF WATER Amlodipine(Norvasc),Omeprazole(Prilosec),Tramadol(Ultram-if needed),and Promethazine(Phenergan-if needed)             No Goody's,BC's,Aleve,Advil,Motrin,Ibuprofen,Fish Oil,or any Herbal Medications.    Do not wear jewelry, make-up or nail polish.  Do not wear lotions, powders, perfumes, or deoderant.  Do not shave 48 hours prior to surgery.    Do not bring valuables to the hospital.  [facility] is not responsible for any belongings or valuables.  Contacts, dentures or bridgework may not be worn into surgery.  Leave your suitcase in the car.  After surgery it may be brought to your room.  For patients admitted to the hospital, discharge time will be determined by your treatment team.  Patients discharged the day of surgery will not be allowed to drive home.    [facility]  Before surgery, you can play an important role.  Because skin is not sterile, your skin needs to be as free of germs as possible.  You can reduce the number of germs on you skin by washing with CHG (chlorahexidine gluconate) soap before surgery.  CHG is an antiseptic cleaner which kills germs and bonds with the skin to continue killing germs even after washing.  Please DO NOT use if you have an allergy to CHG or antibacterial soaps.  If your skin becomes reddened/irritated stop using the CHG and inform your nurse when you arrive at Short Stay.  Do not shave (including legs and  underarms) for at least 48 hours prior to the first CHG shower.  You may shave your face.  Please follow these instructions carefully:   1.  Shower with CHG Soap the night before surgery and the                                morning of Surgery.  2.  If you choose to wash your hair, wash your hair first as usual with your       normal shampoo.  3.  After you shampoo, rinse your hair and body thoroughly to remove the                      Shampoo.  4.  Use CHG as you would any other liquid soap.  You can apply chg directly       to the skin and wash gently with scrungie or a clean washcloth.  5.  Apply the CHG Soap to your body ONLY FROM THE NECK DOWN.        Do not use on open wounds or open sores.  Avoid contact with your eyes,       ears, mouth and genitals (private parts).  Wash genitals (private parts)       with your normal soap.  6.  Wash thoroughly, paying special attention to the area where your surgery  will be performed.  7.  Thoroughly rinse your body with warm water from the neck down.  8.  DO NOT shower/wash with your normal soap after using and rinsing off       the CHG Soap.  9.  Pat yourself dry with a clean towel.            10.  Wear clean pajamas.            11.  Place clean sheets on your bed the night of your first shower and do not        sleep with pets.  Day of Surgery  Do not apply any lotions/deoderants the morning of surgery.  Please wear clean clothes to the hospital/surgery center.    Please read over the following fact sheets that you were given. Pain Booklet, Coughing and Deep Breathing and Surgical Site Infection Prevention

## 2016-04-20 ENCOUNTER — Encounter (HOSPITAL_COMMUNITY): Payer: Self-pay

## 2016-04-20 ENCOUNTER — Encounter (HOSPITAL_COMMUNITY)
Admission: RE | Admit: 2016-04-20 | Discharge: 2016-04-20 | Disposition: A | Discharge status: Home / Self Care | Payer: No Typology Code available for payment source | Source: Ambulatory Visit | Attending: General Surgery | Admitting: General Surgery

## 2016-04-20 DIAGNOSIS — Z01812 Encounter for preprocedural laboratory examination: Secondary | ICD-10-CM | POA: Insufficient documentation

## 2016-04-20 DIAGNOSIS — K8080 Other cholelithiasis without obstruction: Secondary | ICD-10-CM | POA: Insufficient documentation

## 2016-04-20 DIAGNOSIS — Z01810 Encounter for preprocedural cardiovascular examination: Secondary | ICD-10-CM | POA: Insufficient documentation

## 2016-04-20 HISTORY — DX: Gastro-esophageal reflux disease without esophagitis: K21.9

## 2016-04-20 HISTORY — DX: Headache: R51

## 2016-04-20 HISTORY — DX: Anemia, unspecified: D64.9

## 2016-04-20 HISTORY — DX: Headache, unspecified: R51.9

## 2016-04-20 HISTORY — DX: Essential (primary) hypertension: I10

## 2016-04-20 LAB — CBC
HCT: 46.4 % — ABNORMAL HIGH (ref 36.0–46.0)
HEMOGLOBIN: 15.6 g/dL — AB (ref 12.0–15.0)
MCH: 31.5 pg (ref 26.0–34.0)
MCHC: 33.6 g/dL (ref 30.0–36.0)
MCV: 93.7 fL (ref 78.0–100.0)
Platelets: 302 10*3/uL (ref 150–400)
RBC: 4.95 MIL/uL (ref 3.87–5.11)
RDW: 13.7 % (ref 11.5–15.5)
WBC: 10.2 10*3/uL (ref 4.0–10.5)

## 2016-04-20 LAB — BASIC METABOLIC PANEL
ANION GAP: 9 (ref 5–15)
BUN: 11 mg/dL (ref 6–20)
CALCIUM: 9.9 mg/dL (ref 8.9–10.3)
CO2: 21 mmol/L — AB (ref 22–32)
Chloride: 107 mmol/L (ref 101–111)
Creatinine, Ser: 0.75 mg/dL (ref 0.44–1.00)
GFR calc Af Amer: >60 mL/min (ref >60)
GFR calc non Af Amer: >60 mL/min (ref >60)
GLUCOSE: 111 mg/dL — AB (ref 65–99)
Potassium: 4.4 mmol/L (ref 3.5–5.1)
Sodium: 137 mmol/L (ref 135–145)

## 2016-04-20 LAB — HCG, SERUM, QUALITATIVE: PREG SERUM: NEGATIVE

## 2016-04-25 MED ORDER — CEFAZOLIN SODIUM-DEXTROSE 2-4 GM/100ML-% IV SOLN
2.0000 g | INTRAVENOUS | Status: AC
  Administered 2016-04-26: 2 g via INTRAVENOUS
  Filled 2016-04-25: qty 100

## 2016-04-26 ENCOUNTER — Ambulatory Visit (HOSPITAL_COMMUNITY): Payer: Self-pay | Admitting: Anesthesiology

## 2016-04-26 ENCOUNTER — Ambulatory Visit (HOSPITAL_COMMUNITY)
Admission: RE | Admit: 2016-04-26 | Discharge: 2016-04-26 | Disposition: A | Discharge status: Home / Self Care | Payer: Self-pay | Source: Ambulatory Visit | Attending: General Surgery | Admitting: General Surgery

## 2016-04-26 ENCOUNTER — Encounter (HOSPITAL_COMMUNITY): Payer: Self-pay | Admitting: *Deleted

## 2016-04-26 ENCOUNTER — Encounter (HOSPITAL_COMMUNITY)
Admission: RE | Disposition: A | Discharge status: Home / Self Care | Payer: Self-pay | Source: Ambulatory Visit | Attending: General Surgery

## 2016-04-26 DIAGNOSIS — K8010 Calculus of gallbladder with chronic cholecystitis without obstruction: Secondary | ICD-10-CM | POA: Insufficient documentation

## 2016-04-26 DIAGNOSIS — I10 Essential (primary) hypertension: Secondary | ICD-10-CM | POA: Insufficient documentation

## 2016-04-26 DIAGNOSIS — F419 Anxiety disorder, unspecified: Secondary | ICD-10-CM | POA: Insufficient documentation

## 2016-04-26 HISTORY — PX: CHOLECYSTECTOMY: SHX55

## 2016-04-26 SURGERY — LAPAROSCOPIC CHOLECYSTECTOMY
Anesthesia: General | Site: Abdomen

## 2016-04-26 MED ORDER — ONDANSETRON HCL 4 MG/2ML IJ SOLN
INTRAMUSCULAR | Status: DC | PRN
  Administered 2016-04-26: 4 mg via INTRAVENOUS

## 2016-04-26 MED ORDER — PROPOFOL 10 MG/ML IV BOLUS
INTRAVENOUS | Status: DC | PRN
  Administered 2016-04-26: 150 mg via INTRAVENOUS

## 2016-04-26 MED ORDER — HYDROMORPHONE HCL 1 MG/ML IJ SOLN
INTRAMUSCULAR | Status: AC
  Filled 2016-04-26: qty 0.5

## 2016-04-26 MED ORDER — OXYCODONE HCL 5 MG PO TABS
5.0000 mg | ORAL_TABLET | ORAL | Status: DC | PRN
  Administered 2016-04-26: 5 mg via ORAL

## 2016-04-26 MED ORDER — GLYCOPYRROLATE 0.2 MG/ML IJ SOLN
INTRAMUSCULAR | Status: DC | PRN
  Administered 2016-04-26: 0.4 mg via INTRAVENOUS

## 2016-04-26 MED ORDER — OXYCODONE-ACETAMINOPHEN 5-325 MG PO TABS: 1.0000 | ORAL_TABLET | ORAL | 0 refills | Status: DC | PRN

## 2016-04-26 MED ORDER — BUPIVACAINE HCL (PF) 0.25 % IJ SOLN
INTRAMUSCULAR | Status: AC
  Filled 2016-04-26: qty 30

## 2016-04-26 MED ORDER — OXYCODONE HCL 5 MG PO TABS
ORAL_TABLET | ORAL | Status: AC
  Filled 2016-04-26: qty 1

## 2016-04-26 MED ORDER — MORPHINE SULFATE (PF) 2 MG/ML IV SOLN: 2.0000 mg | INTRAVENOUS | Status: DC | PRN

## 2016-04-26 MED ORDER — SODIUM CHLORIDE 0.9 % IR SOLN
Status: DC | PRN
  Administered 2016-04-26: 1000 mL

## 2016-04-26 MED ORDER — PROPOFOL 10 MG/ML IV BOLUS
INTRAVENOUS | Status: AC
  Filled 2016-04-26: qty 20

## 2016-04-26 MED ORDER — NEOSTIGMINE METHYLSULFATE 5 MG/5ML IV SOSY
PREFILLED_SYRINGE | INTRAVENOUS | Status: AC
  Filled 2016-04-26: qty 5

## 2016-04-26 MED ORDER — MIDAZOLAM HCL 5 MG/5ML IJ SOLN
INTRAMUSCULAR | Status: DC | PRN
  Administered 2016-04-26: 2 mg via INTRAVENOUS

## 2016-04-26 MED ORDER — MIDAZOLAM HCL 2 MG/2ML IJ SOLN
INTRAMUSCULAR | Status: AC
  Filled 2016-04-26: qty 2

## 2016-04-26 MED ORDER — FENTANYL CITRATE (PF) 100 MCG/2ML IJ SOLN
INTRAMUSCULAR | Status: DC | PRN
  Administered 2016-04-26 (×4): 50 ug via INTRAVENOUS

## 2016-04-26 MED ORDER — ACETAMINOPHEN 650 MG RE SUPP
650.0000 mg | RECTAL | Status: DC | PRN
  Filled 2016-04-26: qty 1

## 2016-04-26 MED ORDER — LIDOCAINE 2% (20 MG/ML) 5 ML SYRINGE
INTRAMUSCULAR | Status: AC
  Filled 2016-04-26: qty 5

## 2016-04-26 MED ORDER — NEOSTIGMINE METHYLSULFATE 10 MG/10ML IV SOLN
INTRAVENOUS | Status: DC | PRN
  Administered 2016-04-26: 3 mg via INTRAVENOUS

## 2016-04-26 MED ORDER — LIDOCAINE HCL (CARDIAC) 20 MG/ML IV SOLN
INTRAVENOUS | Status: DC | PRN
  Administered 2016-04-26: 80 mg via INTRAVENOUS

## 2016-04-26 MED ORDER — HYDROMORPHONE HCL 1 MG/ML IJ SOLN
0.2500 mg | INTRAMUSCULAR | Status: DC | PRN
  Administered 2016-04-26 (×2): 0.5 mg via INTRAVENOUS

## 2016-04-26 MED ORDER — ACETAMINOPHEN 325 MG PO TABS
650.0000 mg | ORAL_TABLET | ORAL | Status: DC | PRN
  Filled 2016-04-26: qty 2

## 2016-04-26 MED ORDER — GLYCOPYRROLATE 0.2 MG/ML IV SOSY
PREFILLED_SYRINGE | INTRAVENOUS | Status: AC
  Filled 2016-04-26: qty 3

## 2016-04-26 MED ORDER — 0.9 % SODIUM CHLORIDE (POUR BTL) OPTIME
TOPICAL | Status: DC | PRN
  Administered 2016-04-26: 1000 mL

## 2016-04-26 MED ORDER — BUPIVACAINE HCL 0.25 % IJ SOLN
INTRAMUSCULAR | Status: DC | PRN
  Administered 2016-04-26: 6 mL

## 2016-04-26 MED ORDER — FENTANYL CITRATE (PF) 100 MCG/2ML IJ SOLN
INTRAMUSCULAR | Status: AC
  Filled 2016-04-26: qty 2

## 2016-04-26 MED ORDER — ROCURONIUM BROMIDE 50 MG/5ML IV SOSY
PREFILLED_SYRINGE | INTRAVENOUS | Status: AC
  Filled 2016-04-26: qty 5

## 2016-04-26 MED ORDER — SODIUM CHLORIDE 0.9 % IV SOLN: 250.0000 mL | INTRAVENOUS | Status: DC | PRN

## 2016-04-26 MED ORDER — LACTATED RINGERS IV SOLN
INTRAVENOUS | Status: DC | PRN
  Administered 2016-04-26 (×2): via INTRAVENOUS

## 2016-04-26 MED ORDER — ROCURONIUM BROMIDE 100 MG/10ML IV SOLN
INTRAVENOUS | Status: DC | PRN
  Administered 2016-04-26: 30 mg via INTRAVENOUS

## 2016-04-26 SURGICAL SUPPLY — 37 items
BENZOIN TINCTURE PRP APPL 2/3 (GAUZE/BANDAGES/DRESSINGS) ×3 IMPLANT
CANISTER SUCTION 2500CC (MISCELLANEOUS) ×3 IMPLANT
CHLORAPREP W/TINT 26ML (MISCELLANEOUS) ×3 IMPLANT
CLIP LIGATING HEMO O LOK GREEN (MISCELLANEOUS) ×3 IMPLANT
CLOSURE WOUND 1/2 X4 (GAUZE/BANDAGES/DRESSINGS) ×1
COVER SURGICAL LIGHT HANDLE (MISCELLANEOUS) ×3 IMPLANT
COVER TRANSDUCER ULTRASND (DRAPES) ×3 IMPLANT
DEVICE TROCAR PUNCTURE CLOSURE (ENDOMECHANICALS) ×3 IMPLANT
DISSECTOR BLUNT TIP ENDO 5MM (MISCELLANEOUS) ×3 IMPLANT
ELECT REM PT RETURN 9FT ADLT (ELECTROSURGICAL) ×3
ELECTRODE REM PT RTRN 9FT ADLT (ELECTROSURGICAL) ×1 IMPLANT
GAUZE SPONGE 2X2 8PLY STRL LF (GAUZE/BANDAGES/DRESSINGS) ×1 IMPLANT
GLOVE BIO SURGEON STRL SZ7.5 (GLOVE) ×3 IMPLANT
GOWN STRL REUS W/ TWL LRG LVL3 (GOWN DISPOSABLE) ×2 IMPLANT
GOWN STRL REUS W/ TWL XL LVL3 (GOWN DISPOSABLE) ×1 IMPLANT
GOWN STRL REUS W/TWL LRG LVL3 (GOWN DISPOSABLE) ×4
GOWN STRL REUS W/TWL XL LVL3 (GOWN DISPOSABLE) ×2
KIT BASIN OR (CUSTOM PROCEDURE TRAY) ×3 IMPLANT
KIT ROOM TURNOVER OR (KITS) ×3 IMPLANT
NEEDLE INSUFFLATION 14GA 120MM (NEEDLE) ×3 IMPLANT
NS IRRIG 1000ML POUR BTL (IV SOLUTION) ×3 IMPLANT
PAD ARMBOARD 7.5X6 YLW CONV (MISCELLANEOUS) ×6 IMPLANT
POUCH RETRIEVAL ECOSAC 10 (ENDOMECHANICALS) IMPLANT
POUCH RETRIEVAL ECOSAC 10MM (ENDOMECHANICALS)
SCISSORS LAP 5X35 DISP (ENDOMECHANICALS) ×3 IMPLANT
SET IRRIG TUBING LAPAROSCOPIC (IRRIGATION / IRRIGATOR) ×3 IMPLANT
SLEEVE ENDOPATH XCEL 5M (ENDOMECHANICALS) ×6 IMPLANT
SPECIMEN JAR SMALL (MISCELLANEOUS) ×3 IMPLANT
SPONGE GAUZE 2X2 STER 10/PKG (GAUZE/BANDAGES/DRESSINGS) ×2
STRIP CLOSURE SKIN 1/2X4 (GAUZE/BANDAGES/DRESSINGS) ×2 IMPLANT
SUT MNCRL AB 3-0 PS2 18 (SUTURE) ×3 IMPLANT
TOWEL OR 17X24 6PK STRL BLUE (TOWEL DISPOSABLE) ×3 IMPLANT
TOWEL OR 17X26 10 PK STRL BLUE (TOWEL DISPOSABLE) ×3 IMPLANT
TRAY LAPAROSCOPIC MC (CUSTOM PROCEDURE TRAY) ×3 IMPLANT
TROCAR XCEL NON-BLD 11X100MML (ENDOMECHANICALS) ×3 IMPLANT
TROCAR XCEL NON-BLD 5MMX100MML (ENDOMECHANICALS) ×3 IMPLANT
TUBING INSUFFLATION (TUBING) ×3 IMPLANT

## 2016-04-26 NOTE — Op Note (Signed)
04/26/2016  8:21 AM  PATIENT:  Jessica Garrett  45 y.o. female  PRE-OPERATIVE DIAGNOSIS:  GALLSTONES  POST-OPERATIVE DIAGNOSIS:  GALLSTONES  PROCEDURE:  Procedure(s): LAPAROSCOPIC CHOLECYSTECTOMY (N/A)  SURGEON:  Surgeon(s) and Role:    * Axel FillerArmando Mckensi Redinger, MD - Primary   ASSISTANTS: none   ANESTHESIA:   local and general  EBL:  5cc  BLOOD ADMINISTERED:none  DRAINS: none   LOCAL MEDICATIONS USED:  BUPIVICAINE   SPECIMEN:  Source of Specimen:  gallbladder  DISPOSITION OF SPECIMEN:  PATHOLOGY  COUNTS:  YES  TOURNIQUET:  * No tourniquets in log *  DICTATION: .Dragon Dictation  The patient was taken to the operating and placed in the supine position with bilateral SCDs in place. The patient was prepped and draped in the usual sterile fashion. A time out was called and all facts were verified. A pneumoperitoneum was obtained via A Veress needle technique to a pressure of 14mm of mercury.  A 5mm trochar was then placed in the right upper quadrant under visualization, and there were no injuries to any abdominal organs. A 11 mm port was then placed in the umbilical region after infiltrating with local anesthesia under direct visualization. A second and third epigastric port and right lower quadrant port placement under direct visualization, respectively.  There was a large lobe of the liver overlying Calot's triangle.  Due to this, I proceeded with a dome down fashion dissection.  The hook cautery was used to dissect the fundus of the gallbladder from the liver bed.  This dissection was taken down to the Calot's triangle.  The cystic artery was then ligated with two hemoclips proximally and one distally, and then transected inbetween the three.  The cystic was  seen going into the gallbladder 360.  2 clips were placed proximally one distally and the cystic duct transected. The cystic artery was identified and 2 clips placed proximally and one distally and transected.    A  retrieval bag was then placed in the abdomen and gallbladder placed in the bag. The hepatic fossa was then reexamined and hemostasis was achieved with Bovie cautery and was excellent at the end of the case. The subhepatic fossa and perihepatic fossa was then irrigated until the effluent was clear.  The gallbladder and bag were removed from the abdominal cavity. The 11 mm trocar fascia was reapproximated with the Endo Close #1 Vicryl x1. The pneumoperitoneum was evacuated and all trochars removed under direct visulalization. The skin was then closed with 4-0 Monocryl and the skin dressed with Steri-Strips, gauze, and tape. The patient was awaken from general anesthesia and taken to the recovery room in stable condition.   PLAN OF CARE: Discharge to home after PACU  PATIENT DISPOSITION:  PACU - hemodynamically stable.   Delay start of Pharmacological VTE agent (>24hrs) due to surgical blood loss or risk of bleeding: not applicable

## 2016-04-26 NOTE — Discharge Instructions (Signed)
[facility] ______[facility] °LAPAROSCOPIC SURGERY: POST OP INSTRUCTIONS °Always review your discharge instruction sheet given to you by the facility where your surgery was performed. °IF YOU HAVE DISABILITY OR FAMILY LEAVE FORMS, YOU MUST BRING THEM TO THE OFFICE FOR PROCESSING.   °DO NOT GIVE THEM TO YOUR DOCTOR. ° °1. A prescription for pain medication may be given to you upon discharge.  Take your pain medication as prescribed, if needed.  If narcotic pain medicine is not needed, then you may take acetaminophen (Tylenol) or ibuprofen (Advil) as needed. °2. Take your usually prescribed medications unless otherwise directed. °3. If you need a refill on your pain medication, please contact your pharmacy.  They will contact our office to request authorization. Prescriptions will not be filled after 5pm or on week-ends. °4. You should follow a light diet the first few days after arrival home, such as soup and crackers, etc.  Be sure to include lots of fluids daily. °5. Most patients will experience some swelling and bruising in the area of the incisions.  Ice packs will help.  Swelling and bruising can take several days to resolve.  °6. It is common to experience some constipation if taking pain medication after surgery.  Increasing fluid intake and taking a stool softener (such as Colace) will usually help or prevent this problem from occurring.  A mild laxative (Milk of Magnesia or Miralax) should be taken according to package instructions if there are no bowel movements after 48 hours. °7. Unless discharge instructions indicate otherwise, you may remove your bandages 24-48 hours after surgery, and you may shower at that time.  You may have steri-strips (small skin tapes) in place directly over the incision.  These strips should be left on the skin for 7-10 days.  If your surgeon used skin glue on the incision, you may shower in 24 hours.  The glue will flake off over the next 2-3 weeks.  Any sutures or  staples will be removed at the office during your follow-up visit. °8. ACTIVITIES:  You may resume regular (light) daily activities beginning the next day--such as daily self-care, walking, climbing stairs--gradually increasing activities as tolerated.  You may have sexual intercourse when it is comfortable.  Refrain from any heavy lifting or straining until approved by your doctor. °a. You may drive when you are no longer taking prescription pain medication, you can comfortably wear a seatbelt, and you can safely maneuver your car and apply brakes. °b. RETURN TO WORK:  __________________________________________________________ °9. You should see your doctor in the office for a follow-up appointment approximately 2-3 weeks after your surgery.  Make sure that you call for this appointment within a day or two after you arrive home to insure a convenient appointment time. °10. OTHER INSTRUCTIONS: __________________________________________________________________________________________________________________________ __________________________________________________________________________________________________________________________ °WHEN TO CALL YOUR DOCTOR: °1. Fever over 101.0 °2. Inability to urinate °3. Continued bleeding from incision. °4. Increased pain, redness, or drainage from the incision. °5. Increasing abdominal pain ° °The clinic staff is available to answer your questions during regular business hours.  Please don’t hesitate to call and ask to speak to one of the nurses for clinical concerns.  If you have a medical emergency, go to the nearest emergency room or call 911.  A surgeon from [facility] is always on call at the hospital. °[address], [address], [place], [place]  [address] ? [address] [address], [place], [place]   [address] °[phone] ? [phone] ? FAX [phone] °Web site:   [email or web address] °

## 2016-04-26 NOTE — H&P (Signed)
The patient is a 45 year old female who presents for evaluation of gall stones. The patient is a 45 year old female who is referred by a was similarly ER for evaluation of symptoms and extends. Patient states that she's had several episodes of epigastric abdominal pain. She states that most of these are after eating fatty meals, and alcohol. She does not state that radiates. She states that on her trip to the ER she had chest pain as well. Patient underwent evaluation by the EDP included laboratory tests and radiological studies. CT scan revealed multiple gallstones, no signs of acute inflammation Patient's LFTs were elevated. AST/ALT, 1067/76. T bili was 2.3. Lipase within normal limits alkaline phosphatase 116.     Other Problems  Anxiety Disorder Back Pain Cholelithiasis  Past Surgical History  No pertinent past surgical history  Diagnostic Studies History Colonoscopy never Mammogram never  Allergies No Known Drug Allergies10/26/2017  Medication History TraMADol HCl (50MG  Tablet, Oral) Active. Promethazine HCl (25MG  Tablet, Oral) Active. Medications Reconciled  Social History  Alcohol use Moderate alcohol use. No caffeine use No drug use Tobacco use Never smoker.  Family History  Diabetes Mellitus Brother. Heart Disease Mother. Heart disease in female family member before age 47 Hypertension Brother, Father, Mother.  Pregnancy / Birth History Age at menarche 11 years. Contraceptive History Oral contraceptives. Gravida 3 Length (months) of breastfeeding 3-6 Maternal age 47-20 Para 3 Regular periods    Review of Systems  General Present- Appetite Loss and Fatigue. Not Present- Chills, Fever, Night Sweats, Weight Gain and Weight Loss. Skin Not Present- Change in Wart/Mole, Dryness, Hives, Jaundice, New Lesions, Non-Healing Wounds, Rash and Ulcer. HEENT Not Present- Earache, Hearing Loss, Hoarseness, Nose Bleed, Oral  Ulcers, Ringing in the Ears, Seasonal Allergies, Sinus Pain, Sore Throat, Visual Disturbances, Wears glasses/contact lenses and Yellow Eyes. Respiratory Not Present- Bloody sputum, Chronic Cough, Difficulty Breathing, Snoring and Wheezing. Breast Not Present- Breast Mass, Breast Pain, Nipple Discharge and Skin Changes. Cardiovascular Not Present- Chest Pain, Difficulty Breathing Lying Down, Leg Cramps, Palpitations, Rapid Heart Rate, Shortness of Breath and Swelling of Extremities. Gastrointestinal Present- Abdominal Pain, Bloating, Constipation, Excessive gas, Nausea and Vomiting. Not Present- Bloody Stool, Change in Bowel Habits, Chronic diarrhea, Difficulty Swallowing, Gets full quickly at meals, Hemorrhoids, Indigestion and Rectal Pain. Female Genitourinary Present- Nocturia. Not Present- Frequency, Painful Urination, Pelvic Pain and Urgency. Musculoskeletal Present- Back Pain. Not Present- Joint Pain, Joint Stiffness, Muscle Pain, Muscle Weakness and Swelling of Extremities. Neurological Not Present- Decreased Memory, Fainting, Headaches, Numbness, Seizures, Tingling, Tremor, Trouble walking and Weakness. Psychiatric Present- Change in Sleep Pattern. Not Present- Anxiety, Bipolar, Depression, Fearful and Frequent crying. Endocrine Present- Hot flashes. Not Present- Cold Intolerance, Excessive Hunger, Hair Changes, Heat Intolerance and New Diabetes. Hematology Present- Easy Bruising. Not Present- Blood Thinners, Excessive bleeding, Gland problems, HIV and Persistent Infections.  BP (!) 142/79   Pulse 95   Temp 98.4 F (36.9 C)   Resp 16   Ht 5\' 4"  (1.626 m)   Wt 66.2 kg (146 lb)   LMP 04/23/2016   SpO2 99%   BMI 25.06 kg/m     Physical Exam  General Mental Status-Alert. General Appearance-Consistent with stated age. Hydration-Well hydrated. Voice-Normal.  Head and Neck Head-normocephalic, atraumatic with no lesions or palpable masses.  Eye Eyeball -  Bilateral-Extraocular movements intact. Sclera/Conjunctiva - Bilateral-No scleral icterus.  Chest and Lung Exam Chest and lung exam reveals -quiet, even and easy respiratory effort with no use of accessory muscles. Inspection Chest Wall -  Normal. Back - normal.  Cardiovascular Cardiovascular examination reveals -normal heart sounds, regular rate and rhythm with no murmurs.  Abdomen Inspection Normal Exam - No Hernias. Palpation/Percussion Normal exam - Soft, Non Tender, No Rebound tenderness, No Rigidity (guarding) and No hepatosplenomegaly. Auscultation Normal exam - Bowel sounds normal.  Neurologic Neurologic evaluation reveals -alert and oriented x 3 with no impairment of recent or remote memory. Mental Status-Normal.  Musculoskeletal Normal Exam - Left-Upper Extremity Strength Normal and Lower Extremity Strength Normal. Normal Exam - Right-Upper Extremity Strength Normal, Lower Extremity Weakness.    Assessment & Plan SYMPTOMATIC CHOLELITHIASIS (K80.20) Impression: 45 year old female with symptomatic cholelithiasis  1. We will proceed to the operating room for a laparoscopic cholecystectomy 2. Risks and benefits were discussed with the patient to generally include, but not limited to: infection, bleeding, possible need for post op ERCP, damage to the bile ducts, bile leak, and possible need for further surgery. Alternatives were offered and described. All questions were answered and the patient voiced understanding of the procedure and wishes to proceed at this point with a laparoscopic cholecystectomy

## 2016-04-26 NOTE — Anesthesia Procedure Notes (Signed)
Procedure Name: Intubation Date/Time: 04/26/2016 7:31 AM Performed by: Quentin OreWALKER, Antwan Pandya E Pre-anesthesia Checklist: Patient identified, Emergency Drugs available, Suction available and Patient being monitored Patient Re-evaluated:Patient Re-evaluated prior to inductionOxygen Delivery Method: Circle system utilized Preoxygenation: Pre-oxygenation with 100% oxygen Intubation Type: IV induction Ventilation: Mask ventilation without difficulty Laryngoscope Size: Mac and 3 Grade View: Grade I Tube type: Oral Tube size: 7.0 mm Number of attempts: 1 Airway Equipment and Method: Stylet Placement Confirmation: ETT inserted through vocal cords under direct vision,  positive ETCO2 and breath sounds checked- equal and bilateral Secured at: 21 cm Tube secured with: Tape Dental Injury: Teeth and Oropharynx as per pre-operative assessment

## 2016-04-26 NOTE — Anesthesia Preprocedure Evaluation (Addendum)
Anesthesia Evaluation  Patient identified by MRN, date of birth, ID band Patient awake    Reviewed: Allergy & Precautions, NPO status , Patient's Chart, lab work & pertinent test results  History of Anesthesia Complications (+) POST - OP SPINAL HEADACHE  Airway Mallampati: II       Dental  (+) Teeth Intact,    Pulmonary    breath sounds clear to auscultation       Cardiovascular hypertension,  Rhythm:Regular Rate:Normal     Neuro/Psych  Headaches,    GI/Hepatic Neg liver ROS, GERD  ,  Endo/Other  negative endocrine ROS  Renal/GU negative Renal ROS     Musculoskeletal   Abdominal   Peds  Hematology  (+) anemia ,   Anesthesia Other Findings   Reproductive/Obstetrics                            Anesthesia Physical Anesthesia Plan  ASA: III  Anesthesia Plan:    Post-op Pain Management:    Induction: Intravenous  Airway Management Planned: Oral ETT  Additional Equipment:   Intra-op Plan:   Post-operative Plan:   Informed Consent: I have reviewed the patients History and Physical, chart, labs and discussed the procedure including the risks, benefits and alternatives for the proposed anesthesia with the patient or authorized representative who has indicated his/her understanding and acceptance.   Dental advisory given  Plan Discussed with: CRNA and Anesthesiologist  Anesthesia Plan Comments:         Anesthesia Quick Evaluation

## 2016-04-26 NOTE — Anesthesia Postprocedure Evaluation (Signed)
Anesthesia Post Note  Patient: Jessica Garrett  Procedure(s) Performed: Procedure(s) (LRB): LAPAROSCOPIC CHOLECYSTECTOMY (N/A)  Patient location during evaluation: PACU Anesthesia Type: General Pain management: pain level controlled Vital Signs Assessment: post-procedure vital signs reviewed and stable Respiratory status: spontaneous breathing Cardiovascular status: stable Anesthetic complications: no    Last Vitals:  Vitals:   04/26/16 0934 04/26/16 0935  BP:  133/79  Pulse:  67  Resp:  16  Temp: 36.1 C     Last Pain:  Vitals:   04/26/16 0904  PainSc: Asleep                 Ayonna Speranza

## 2016-04-26 NOTE — Transfer of Care (Signed)
Immediate Anesthesia Transfer of Care Note  Patient: Jessica Garrett  Procedure(s) Performed: Procedure(s): LAPAROSCOPIC CHOLECYSTECTOMY (N/A)  Patient Location: PACU  Anesthesia Type:General  Level of Consciousness: awake, alert  and oriented  Airway & Oxygen Therapy: Patient Spontanous Breathing and Patient connected to nasal cannula oxygen  Post-op Assessment: Report given to RN, Post -op Vital signs reviewed and stable and Patient moving all extremities  Post vital signs: Reviewed and stable  Last Vitals:  Vitals:   04/26/16 0639 04/26/16 0843  BP:    Pulse:    Resp:    Temp: 36.9 C (P) 36.4 C    Last Pain: There were no vitals filed for this visit.       Complications: No apparent anesthesia complications

## 2016-04-27 ENCOUNTER — Encounter (HOSPITAL_COMMUNITY): Payer: Self-pay | Admitting: General Surgery

## 2016-05-05 ENCOUNTER — Encounter: Payer: Self-pay | Admitting: Family Medicine

## 2016-05-05 ENCOUNTER — Ambulatory Visit (INDEPENDENT_AMBULATORY_CARE_PROVIDER_SITE_OTHER): Payer: Self-pay | Admitting: Family Medicine

## 2016-05-05 ENCOUNTER — Encounter (INDEPENDENT_AMBULATORY_CARE_PROVIDER_SITE_OTHER): Payer: Self-pay

## 2016-05-05 VITALS — BP 133/75 | HR 73 | Temp 98.1°F | Resp 16 | Ht 64.0 in | Wt 143.0 lb

## 2016-05-05 DIAGNOSIS — R05 Cough: Secondary | ICD-10-CM

## 2016-05-05 DIAGNOSIS — R0982 Postnasal drip: Secondary | ICD-10-CM

## 2016-05-05 DIAGNOSIS — J069 Acute upper respiratory infection, unspecified: Secondary | ICD-10-CM

## 2016-05-05 DIAGNOSIS — R059 Cough, unspecified: Secondary | ICD-10-CM

## 2016-05-05 DIAGNOSIS — I10 Essential (primary) hypertension: Secondary | ICD-10-CM

## 2016-05-05 DIAGNOSIS — Z9049 Acquired absence of other specified parts of digestive tract: Secondary | ICD-10-CM

## 2016-05-05 MED ORDER — AMLODIPINE BESYLATE 5 MG PO TABS: 5.0000 mg | ORAL_TABLET | Freq: Every day | ORAL | 1 refills | Status: DC

## 2016-05-05 MED ORDER — AZITHROMYCIN 250 MG PO TABS: ORAL_TABLET | ORAL | 0 refills | Status: DC

## 2016-05-05 NOTE — Progress Notes (Signed)
Subjective:    Patient ID: Jessica Garrett, female    DOB: Apr 20, 1971, 45 y.o.   MRN: [id number]  Cough  This is a chronic problem. Associated symptoms include ear congestion, nasal congestion, postnasal drip and sweats. Pertinent negatives include no chest pain, chills, ear pain, fever, headaches, heartburn, hemoptysis, myalgias, rash, rhinorrhea, sore throat, shortness of breath, weight loss or wheezing. The symptoms are aggravated by lying down. She has tried nothing for the symptoms. The treatment provided no relief. There is no history of asthma, bronchiectasis, bronchitis, COPD, emphysema, environmental allergies or pneumonia.  Hypertension  This is a recurrent problem. The problem is controlled. Associated symptoms include sweats. Pertinent negatives include no chest pain, headaches, malaise/fatigue, neck pain, orthopnea, palpitations, peripheral edema, PND or shortness of breath. There are no associated agents to hypertension. Past treatments include calcium channel blockers. There are no compliance problems.      Past Medical History:  Diagnosis Date  . Anemia    only with pregnancy  . GERD (gastroesophageal reflux disease)   . Headache    " I just go to bed when I have a headache"  . Hypertension    Immunization History  Administered Date(s) Administered  . Tdap 04/11/2016   Social History   Social History  . Marital status: Single    Spouse name: N/A  . Number of children: N/A  . Years of education: N/A   Occupational History  . Not on file.   Social History Main Topics  . Smoking status: Never Smoker  . Smokeless tobacco: Never Used  . Alcohol use No     Comment: quit 03/2016  . Drug use: No  . Sexual activity: Yes    Birth control/ protection: None   Other Topics Concern  . Not on file   Social History Narrative  . No narrative on file    Review of Systems  Constitutional: Negative.  Negative for chills, fever, malaise/fatigue and weight loss.  HENT:  Positive for postnasal drip. Negative for ear pain, rhinorrhea and sore throat.   Eyes: Negative.   Respiratory: Positive for cough. Negative for hemoptysis, shortness of breath and wheezing.   Cardiovascular: Negative.  Negative for chest pain, palpitations, orthopnea and PND.  Gastrointestinal: Negative.  Negative for heartburn.  Endocrine: Negative.   Genitourinary: Negative.   Musculoskeletal: Negative.  Negative for myalgias and neck pain.  Skin: Negative.  Negative for rash.  Allergic/Immunologic: Negative.  Negative for environmental allergies.  Neurological: Negative.  Negative for headaches.  Hematological: Negative.   Psychiatric/Behavioral: Negative.        Objective:   Physical Exam  Constitutional: She is oriented to person, place, and time. She appears well-developed and well-nourished.  HENT:  Head: Normocephalic and atraumatic.  Right Ear: External ear normal.  Left Ear: External ear normal.  Nose: Mucosal edema and rhinorrhea present.  Mouth/Throat: Posterior oropharyngeal erythema present.  Eyes: Conjunctivae and EOM are normal. Pupils are equal, round, and reactive to light.  Neck: Normal range of motion. Neck supple.  Cardiovascular: Normal rate, regular rhythm, normal heart sounds and intact distal pulses.   Pulmonary/Chest: Effort normal and breath sounds normal.  Abdominal: Soft. Bowel sounds are normal. There is tenderness in the right upper quadrant.  Recent cholecystectomy  Musculoskeletal: Normal range of motion.  Neurological: She is alert and oriented to person, place, and time. She has normal reflexes.  Skin: Skin is warm and dry.  Psychiatric: She has a normal mood and affect. Her behavior is  normal. Judgment and thought content normal.                                                                                                                                                                                                                                                                                                                                                                                             BP 133/75 (BP Location: Right Arm, Patient Position: Sitting, Cuff Size: Normal)   Pulse 73   Temp 98.1 F (36.7 C) (Oral)   Resp 16   Ht 5\' 4"  (1.626 m)   Wt 143 lb (64.9 kg)   LMP 04/23/2016   SpO2 100%   BMI 24.55 kg/m  Assessment & Plan:  1. Essential hypertension Blood pressure is at goal on current medication regimen - amLODipine (NORVASC) 5 MG tablet; Take 1 tablet (5 mg total) by mouth daily.  Dispense: 90 tablet; Refill: 1  2. Acute upper respiratory infection Increase rest, handwashing, and vitamin C intake - azithromycin (ZITHROMAX) 250 MG tablet; Take 500 mg today; 250 mg on days 2-5  Dispense: 6 each; Refill: 0  3. Cough - azithromycin (ZITHROMAX) 250 MG tablet; Take 500 mg today; 250 mg on days 2-5  Dispense: 6 each; Refill: 0  4. Post-nasal drip - azithromycin (ZITHROMAX) 250 MG tablet; Take 500 mg today; 250 mg on days 2-5  Dispense: 6 each; Refill: 0  5. S/P laparoscopic cholecystectomy Surgery on 04/26/2016, follow up with surgeon as scheduled - ondansetron (ZOFRAN-ODT) 4 MG disintegrating tablet; Take 4 mg by mouth every 8 (eight) hours as needed for nausea or vomiting. - acetaminophen (  TYLENOL) 325 MG tablet; Take 650 mg by mouth every 6 (six) hours as needed.    RTC: 1 month for hypertension Bridie Colquhoun M, FNP             P

## 2016-05-05 NOTE — Patient Instructions (Addendum)
Start OTC Coricidin HBP as directed for persistent cough Start Azithromycin; 500 mg today and 250 mg on days 2-5 for upper respiratory infection Norvasc 5 mg daily for hypertension. Recommend a lowfat, low carbohydrate diet divided over 5-6 small meals, increase water intake to 6-8 glasses, and 150 minutes per week of cardiovascular exercise.   Upper Respiratory Infection, Adult Most upper respiratory infections (URIs) are a viral infection of the air passages leading to the lungs. A URI affects the nose, throat, and upper air passages. The most common type of URI is nasopharyngitis and is typically referred to as "the common cold." URIs run their course and usually go away on their own. Most of the time, a URI does not require medical attention, but sometimes a bacterial infection in the upper airways can follow a viral infection. This is called a secondary infection. Sinus and middle ear infections are common types of secondary upper respiratory infections. Bacterial pneumonia can also complicate a URI. A URI can worsen asthma and chronic obstructive pulmonary disease (COPD). Sometimes, these complications can require emergency medical care and may be life threatening. What are the causes? Almost all URIs are caused by viruses. A virus is a type of germ and can spread from one person to another. What increases the risk? You may be at risk for a URI if:  You smoke.  You have chronic heart or lung disease.  You have a weakened defense (immune) system.  You are very young or very old.  You have nasal allergies or asthma.  You work in crowded or poorly ventilated areas.  You work in health care facilities or schools. What are the signs or symptoms? Symptoms typically develop 2-3 days after you come in contact with a cold virus. Most viral URIs last 7-10 days. However, viral URIs from the influenza virus (flu virus) can last 14-18 days and are typically more severe. Symptoms may  include:  Runny or stuffy (congested) nose.  Sneezing.  Cough.  Sore throat.  Headache.  Fatigue.  Fever.  Loss of appetite.  Pain in your forehead, behind your eyes, and over your cheekbones (sinus pain).  Muscle aches. How is this diagnosed? Your health care provider may diagnose a URI by:  Physical exam.  Tests to check that your symptoms are not due to another condition such as:  Strep throat.  Sinusitis.  Pneumonia.  Asthma. How is this treated? A URI goes away on its own with time. It cannot be cured with medicines, but medicines may be prescribed or recommended to relieve symptoms. Medicines may help:  Reduce your fever.  Reduce your cough.  Relieve nasal congestion. Follow these instructions at home:  Take medicines only as directed by your health care provider.  Gargle warm saltwater or take cough drops to comfort your throat as directed by your health care provider.  Use a warm mist humidifier or inhale steam from a shower to increase air moisture. This may make it easier to breathe.  Drink enough fluid to keep your urine clear or pale yellow.  Eat soups and other clear broths and maintain good nutrition.  Rest as needed.  Return to work when your temperature has returned to normal or as your health care provider advises. You may need to stay home longer to avoid infecting others. You can also use a face mask and careful hand washing to prevent spread of the virus.  Increase the usage of your inhaler if you have asthma.  Do not use  any tobacco products, including cigarettes, chewing tobacco, or electronic cigarettes. If you need help quitting, ask your health care provider. How is this prevented? The best way to protect yourself from getting a cold is to practice good hygiene.  Avoid oral or hand contact with people with cold symptoms.  Wash your hands often if contact occurs. There is no clear evidence that vitamin C, vitamin E,  echinacea, or exercise reduces the chance of developing a cold. However, it is always recommended to get plenty of rest, exercise, and practice good nutrition. Contact a health care provider if:  You are getting worse rather than better.  Your symptoms are not controlled by medicine.  You have chills.  You have worsening shortness of breath.  You have brown or red mucus.  You have yellow or brown nasal discharge.  You have pain in your face, especially when you bend forward.  You have a fever.  You have swollen neck glands.  You have pain while swallowing.  You have white areas in the back of your throat. Get help right away if:  You have severe or persistent:  Headache.  Ear pain.  Sinus pain.  Chest pain.  You have chronic lung disease and any of the following:  Wheezing.  Prolonged cough.  Coughing up blood.  A change in your usual mucus.  You have a stiff neck.  You have changes in your:  Vision.  Hearing.  Thinking.  Mood. This information is not intended to replace advice given to you by your health care provider. Make sure you discuss any questions you have with your health care provider. Document Released: 10/26/2000 Document Revised: 01/03/2016 Document Reviewed: 08/07/2013 Elsevier Interactive Patient Education  2017 [employer]

## 2016-05-06 LAB — POCT URINALYSIS DIP (DEVICE)
BILIRUBIN URINE: NEGATIVE
Glucose, UA: NEGATIVE mg/dL
KETONES UR: NEGATIVE mg/dL
Leukocytes, UA: NEGATIVE
Nitrite: NEGATIVE
PH: 5.5 (ref 5.0–8.0)
Protein, ur: NEGATIVE mg/dL
Urobilinogen, UA: 0.2 mg/dL (ref 0.0–1.0)

## 2016-05-30 ENCOUNTER — Other Ambulatory Visit (HOSPITAL_COMMUNITY)
Admission: RE | Admit: 2016-05-30 | Discharge: 2016-05-30 | Disposition: A | Discharge status: Home / Self Care | Payer: Self-pay | Source: Ambulatory Visit | Attending: Family Medicine | Admitting: Family Medicine

## 2016-05-30 ENCOUNTER — Ambulatory Visit (INDEPENDENT_AMBULATORY_CARE_PROVIDER_SITE_OTHER): Payer: Self-pay | Admitting: Family Medicine

## 2016-05-30 ENCOUNTER — Encounter: Payer: Self-pay | Admitting: Family Medicine

## 2016-05-30 VITALS — BP 150/82 | HR 82 | Temp 98.3°F | Resp 16 | Ht 64.0 in | Wt 146.0 lb

## 2016-05-30 DIAGNOSIS — Z113 Encounter for screening for infections with a predominantly sexual mode of transmission: Secondary | ICD-10-CM | POA: Insufficient documentation

## 2016-05-30 DIAGNOSIS — Z114 Encounter for screening for human immunodeficiency virus [HIV]: Secondary | ICD-10-CM

## 2016-05-30 DIAGNOSIS — Z1231 Encounter for screening mammogram for malignant neoplasm of breast: Secondary | ICD-10-CM

## 2016-05-30 DIAGNOSIS — Z01419 Encounter for gynecological examination (general) (routine) without abnormal findings: Secondary | ICD-10-CM

## 2016-05-30 DIAGNOSIS — I10 Essential (primary) hypertension: Secondary | ICD-10-CM

## 2016-05-30 DIAGNOSIS — Z1239 Encounter for other screening for malignant neoplasm of breast: Secondary | ICD-10-CM

## 2016-05-30 LAB — HIV ANTIBODY (ROUTINE TESTING W REFLEX): HIV 1&2 Ab, 4th Generation: NONREACTIVE

## 2016-05-30 MED ORDER — AMLODIPINE BESYLATE 5 MG PO TABS: 5.0000 mg | ORAL_TABLET | Freq: Every day | ORAL | 5 refills | Status: DC

## 2016-05-30 NOTE — Progress Notes (Signed)
Subjective:    Patient ID: Jessica Garrett, female    DOB: 04-25-1971, 46 y.o.   MRN: [id number]  Hypertension  This is a chronic problem. The current episode started more than 1 year ago. The problem is unchanged. The problem is controlled. Pertinent negatives include no anxiety, blurred vision, chest pain, headaches, malaise/fatigue, neck pain, orthopnea, palpitations, PND, shortness of breath or sweats. There are no associated agents to hypertension. Risk factors for coronary artery disease include sedentary lifestyle. Past treatments include calcium channel blockers. There are no compliance problems.  There is no history of angina, kidney disease, CAD/MI, CVA, heart failure, left ventricular hypertrophy, PVD or retinopathy.  Gynecologic Exam  The patient's pertinent negatives include no genital itching, genital lesions, genital odor, genital rash, missed menses, pelvic pain, vaginal bleeding or vaginal discharge. Primary symptoms comment: Patient presents for routine gynecological examination. She is not pregnant. Pertinent negatives include no headaches. She is sexually active. No, her partner does not have an STD. She uses tubal ligation for contraception. Her past medical history is significant for a gynecological surgery. There is no history of herpes simplex.   Past Medical History:  Diagnosis Date  . Anemia    only with pregnancy  . GERD (gastroesophageal reflux disease)   . Headache    " I just go to bed when I have a headache"  . Hypertension    Social History   Social History Narrative  . No narrative on file   Immunization History  Administered Date(s) Administered  . Tdap 04/11/2016    Review of Systems  Constitutional: Negative.  Negative for malaise/fatigue.  HENT: Negative.   Eyes: Negative.  Negative for blurred vision.  Respiratory: Negative.  Negative for shortness of breath.   Cardiovascular: Negative.  Negative for chest pain, palpitations, orthopnea and PND.   Gastrointestinal: Negative.   Endocrine: Negative.  Negative for polydipsia, polyphagia and polyuria.  Genitourinary: Negative for missed menses, pelvic pain and vaginal discharge.  Musculoskeletal: Negative.  Negative for neck pain.  Skin: Negative.   Allergic/Immunologic: Negative.  Negative for environmental allergies, food allergies and immunocompromised state.  Neurological: Negative.  Negative for headaches.  Hematological: Negative.   Psychiatric/Behavioral: Negative.        Objective:   Physical Exam  Constitutional: She is oriented to person, place, and time. She appears well-developed and well-nourished.  HENT:  Head: Normocephalic and atraumatic.  Right Ear: External ear normal.  Left Ear: External ear normal.  Nose: Nose normal.  Mouth/Throat: Oropharynx is clear and moist.  Eyes: Conjunctivae and EOM are normal. Pupils are equal, round, and reactive to light.  Neck: Normal range of motion. Neck supple.  Cardiovascular: Normal rate, regular rhythm, normal heart sounds and intact distal pulses.   Pulmonary/Chest: Effort normal and breath sounds normal.  Abdominal: Soft. Bowel sounds are normal.  Musculoskeletal: Normal range of motion.  Neurological: She is alert and oriented to person, place, and time. She has normal reflexes.  Skin: Skin is warm and dry.  Psychiatric: She has a normal mood and affect. Her behavior is normal. Judgment and thought content normal.     BP (!) 150/82 (BP Location: Right Arm, Patient Position: Sitting, Cuff Size: Normal)   Pulse 82   Temp 98.3 F (36.8 C) (Oral)   Resp 16   Ht 5\' 4"  (1.626 m)   Wt 146 lb (66.2 kg)   LMP 05/21/2016   SpO2 100%   BMI 25.06 kg/m  Assessment & Plan:  1. Essential  hypertension Blood pressure is above goal on current medication regimen. There have been some compliance issues here. I have discussed with her the great importance of following the treatment plan exactly as directed in order to achieve a  good medical outcome. The patient is asked to make an attempt to improve diet and exercise patterns to aid in medical management of this problem. Recommend a lowfat, low carbohydrate diet divided over 5-6 small meals, increase water intake to 6-8 glasses, and 150 minutes per week of cardiovascular exercise.    2. Encounter for gynecological examination without abnormal finding  - Cytology - PAP [place]  3. Screening for HIV (human immunodeficiency virus)  - HIV antibody (with reflex)  4. Breast cancer screening Patient given information on the Breast Cancer and Cervical Cancer Control Program [phone]   RTC: 6 months for hypertension  Shonna Deiter M, FNP    The patient was given clear instructions to go to ER or return to medical center if symptoms do not improve, worsen or new problems develop. The patient verbalized understanding. Will notify patient with laboratory results.

## 2016-05-30 NOTE — Patient Instructions (Addendum)
Managing Your Hypertension Hypertension is commonly called high blood pressure. Blood pressure is a measurement of how strongly your blood is pressing against the walls of your arteries. Arteries are blood vessels that carry blood from your heart throughout your body. Blood pressure does not stay the same. It rises when you are active, excited, or nervous. It lowers when you are sleeping or relaxed. If the numbers that measure your blood pressure stay above normal most of the time, you are at risk for health problems. Hypertension is a long-term (chronic) condition in which blood pressure is elevated. This condition often has no signs or symptoms. The cause of the condition is usually not known. What are blood pressure readings? A blood pressure reading is recorded as two numbers, such as "120 over 80" (or 120/80). The first ("top") number is called the systolic pressure. It is a measure of the pressure in your arteries as the heart beats. The second ("bottom") number is called the diastolic pressure. It is a measure of the pressure in your arteries as the heart relaxes between beats. What does my blood pressure reading mean? Blood pressure is classified into four stages. Based on your blood pressure reading, your health care provider may use the following stages to determine what type of treatment, if any, is needed. Systolic pressure and diastolic pressure are measured in a unit called mm Hg. Normal  Systolic pressure: below 123456.  Diastolic pressure: below 80. Prehypertension  Systolic pressure: 123456.  Diastolic pressure: [id number]. Hypertension stage 1  Systolic pressure: A999333.  Diastolic pressure: A999333. Hypertension stage 2  Systolic pressure: 0000000 or above.  Diastolic pressure: 123XX123 or above. What health risks are associated with hypertension? Managing your hypertension is an important responsibility. Uncontrolled hypertension can lead to:  A heart attack.  A stroke.  A  weakened blood vessel (aneurysm).  Heart failure.  Kidney damage.  Eye damage.  Metabolic syndrome.  Memory and concentration problems. What changes can I make to manage my hypertension? Hypertension can be managed effectively by making lifestyle changes and possibly by taking medicines. Your health care provider will help you come up with a plan to bring your blood pressure within a normal range. Your plan should include the following: Monitoring  Monitor your blood pressure at home as told by your health care provider. Your personal target blood pressure may vary depending on your medical conditions, your age, and other factors.  Have your blood pressure rechecked as told by your health care provider. Lifestyle  Lose weight if necessary.  Get at least 30-45 minutes of aerobic exercise at least 4 times a week.  Do not use any products that contain nicotine or tobacco, such as cigarettes and e-cigarettes. If you need help quitting, ask your health care provider.  Learn ways to reduce stress.  Control any chronic conditions, such as high cholesterol or diabetes. Eating and drinking  Follow the DASH diet. This diet is high in fruits, vegetables, and whole grains. It is low in salt, red meat, and added sugars.  Keep your sodium intake below 2,300 mg per day.  Limit alcoholic beverages. Communication  Review all the medicines you take with your health care provider because there may be side effects or interactions.  Talk with your health care provider about your diet, exercise habits, and other lifestyle factors that may be contributing to hypertension.  See your health care provider regularly. Your health care provider can help you create and adjust your plan for managing hypertension. Will  I need medicine to control my blood pressure? Your health care provider may prescribe medicine if lifestyle changes are not enough to get your blood pressure under control, and if one of  the following is true:  You are 2018-48 years of age, and your systolic blood pressure is 140 or higher.  You are 560 years of age or older, and your systolic blood pressure is 150 or higher.  Your diastolic blood pressure is 90 or higher.  You have diabetes, and your systolic blood pressure is over 140 or your diastolic blood pressure is over 90.  You have kidney disease, and your blood pressure is above 140/90.  You have heart disease or a history of stroke, and your blood pressure is 140/90 or higher. Take medicines only as told by your health care provider. Follow the directions carefully. Blood pressure medicines must be taken as prescribed. The medicine does not work as well when you skip doses. Skipping doses also puts you at risk for problems. Contact a health care provider if:  You think you are having a reaction to medicines you have taken.  You have repeated (recurrent) headaches.  You feel dizzy.  You have swelling in your ankles.  You have trouble with your vision. Get help right away if:  You develop a severe headache or confusion.  You have unusual weakness or numbness, or you feel faint.  You have severe pain in your chest or abdomen.  You vomit repeatedly.  You have trouble breathing. This information is not intended to replace advice given to you by your health care provider. Make sure you discuss any questions you have with your health care provider. Document Released: 01/25/2012 Document Revised: 01/05/2016 Document Reviewed: 07/31/2015 Elsevier Interactive Patient Education  2017 [employer] Inc[employer]  Heart Disease Prevention Heart disease is a leading cause of death. There are many things you can do to help prevent heart disease. Be physically active Physical activity is good for your heart. It helps control your blood pressure, cholesterol levels, and weight. Try to be physically active every day. Ask your health care provider what activities are best for  you. Be a healthy weight Extra weight can strain your heart and affect your blood pressure and cholesterol levels. Lose weight with diet and exercise if recommended by your health care provider. Eat heart-healthy foods Follow a healthy eating plan as recommended by your health care provider or dietitian. Heart-healthy foods include:  High-fiber foods. These include oat bran, oatmeal, and whole-grain breads and cereals.  Fruits and vegetables. Avoid:  Alcohol.  Fried foods.  Foods high in saturated fat. These include meats, butter, whole dairy products, shortening, and coconut or palm oil.  Salty foods. These include canned food, luncheon meat, salty snacks, and fast food. Keep your cholesterol levels under control Cholesterol is a substance that is used for many important functions. When your cholesterol levels are high, cholesterol can stick to the insides of your blood vessels, making them narrow or clog. This can lead to chest pain (angina) and a heart attack. Keep your cholesterol levels under control as recommended by your health care provider. Have your cholesterol checked at least once a year. Target cholesterol levels (in mg/dL) for most people are:  Total cholesterol below 200.  LDL cholesterol below 100.  HDL cholesterol above 40 in men and above 50 in women.  Triglycerides below 150. Keep your blood pressure under control Having high blood pressure (hypertension) puts you at risk for stroke and other forms  of heart disease. Keep your blood pressure under control as recommended by your health care provider. Ask your health care provider if you need treatment to lower your blood pressure. If you are 35-28 years of age, have your blood pressure checked every 3-5 years. If you are 27 years of age or older, have your blood pressure checked every year. Do not use tobacco products Tobacco smoke can damage your heart and blood vessels. Do not use any tobacco products including  cigarettes, chewing tobacco, or electronic cigarettes. If you need help quitting, ask your health care provider. Take medicines as directed Take medicines only as directed by your health care provider. Ask your health care provider whether you should take an aspirin every day. Taking aspirin can help reduce your risk of heart disease and stroke. Where to find more information: To find out more about heart disease, visit the American Heart Association's website at www.americanheart.org This information is not intended to replace advice given to you by your health care provider. Make sure you discuss any questions you have with your health care provider. Document Released: 12/15/2003 Document Revised: 09/30/2015 Document Reviewed: 06/26/2013 Elsevier Interactive Patient Education  2017 [employer].

## 2016-05-31 LAB — POCT URINALYSIS DIP (DEVICE)
BILIRUBIN URINE: NEGATIVE
Glucose, UA: NEGATIVE mg/dL
KETONES UR: NEGATIVE mg/dL
Leukocytes, UA: NEGATIVE
Nitrite: NEGATIVE
PH: 6 (ref 5.0–8.0)
PROTEIN: NEGATIVE mg/dL
Specific Gravity, Urine: 1.025 (ref 1.005–1.030)
Urobilinogen, UA: 0.2 mg/dL (ref 0.0–1.0)

## 2016-06-02 MED FILL — ?AMLODIPINE BESYLATE 5 MG T: 5 | 30 days supply | Qty: 30 | Fill #0

## 2016-06-03 LAB — CYTOLOGY - PAP
Adequacy: ABSENT
BACTERIAL VAGINITIS: POSITIVE — AB
Candida vaginitis: NEGATIVE
Chlamydia: NEGATIVE
Diagnosis: NEGATIVE
Neisseria Gonorrhea: NEGATIVE

## 2016-06-06 ENCOUNTER — Other Ambulatory Visit: Payer: Self-pay | Admitting: Family Medicine

## 2016-06-06 DIAGNOSIS — B9689 Other specified bacterial agents as the cause of diseases classified elsewhere: Secondary | ICD-10-CM

## 2016-06-06 DIAGNOSIS — N760 Acute vaginitis: Principal | ICD-10-CM

## 2016-06-06 MED ORDER — METRONIDAZOLE 500 MG PO TABS: 500.0000 mg | ORAL_TABLET | Freq: Two times a day (BID) | ORAL | 0 refills | Status: DC

## 2016-06-06 MED FILL — ?METRONIDAZOLE 500 MG TABLE: 500 | 7 days supply | Qty: 14 | Fill #0

## 2016-06-06 NOTE — Progress Notes (Signed)
Called and advised patient of bacterial vaginitis. Advised patient to take flagyl as prescribed for 7 days and to avoid alcohol. Patient verbalized understanding. Thanks!

## 2016-06-06 NOTE — Progress Notes (Signed)
Meds ordered this encounter  Medications  . metroNIDAZOLE (FLAGYL) 500 MG tablet    Sig: Take 1 tablet (500 mg total) by mouth 2 (two) times daily.    Dispense:  14 tablet    Refill:  0    

## 2016-07-12 ENCOUNTER — Ambulatory Visit: Payer: Self-pay | Admitting: Family Medicine

## 2016-07-20 ENCOUNTER — Ambulatory Visit (INDEPENDENT_AMBULATORY_CARE_PROVIDER_SITE_OTHER): Payer: Self-pay | Admitting: Family Medicine

## 2016-07-20 ENCOUNTER — Encounter: Payer: Self-pay | Admitting: Family Medicine

## 2016-07-20 VITALS — BP 132/87 | HR 80 | Temp 98.0°F | Resp 16 | Ht 64.0 in | Wt 143.0 lb

## 2016-07-20 DIAGNOSIS — R0982 Postnasal drip: Secondary | ICD-10-CM

## 2016-07-20 DIAGNOSIS — R0981 Nasal congestion: Secondary | ICD-10-CM

## 2016-07-20 DIAGNOSIS — J3489 Other specified disorders of nose and nasal sinuses: Secondary | ICD-10-CM

## 2016-07-20 DIAGNOSIS — J0100 Acute maxillary sinusitis, unspecified: Secondary | ICD-10-CM

## 2016-07-20 MED ORDER — AZITHROMYCIN 250 MG PO TABS: ORAL_TABLET | ORAL | 0 refills | Status: DC

## 2016-07-20 MED ORDER — CETIRIZINE HCL 10 MG PO TABS: 10.0000 mg | ORAL_TABLET | Freq: Every day | ORAL | 11 refills | Status: DC

## 2016-07-20 MED FILL — AZITHROMYCIN 250 MG TABLET: 250 | 5 days supply | Qty: 6 | Fill #0

## 2016-07-20 MED FILL — AMLODIPINE BESYLATE 5 MG TA: 5 | 30 days supply | Qty: 30 | Fill #1

## 2016-07-20 NOTE — Patient Instructions (Signed)
Simply Saline use 3-4 times per day as needed

## 2016-07-20 NOTE — Progress Notes (Signed)
Subjective:    Patient ID: Jessica Garrett, female    DOB: December 15, 1970, 46 y.o.   MRN: [id number]  Sinusitis  This is a new problem. The current episode started in the past 7 days. The problem is unchanged. There has been no fever. The pain is mild. Associated symptoms include congestion, headaches, sinus pressure and a sore throat. Pertinent negatives include no sneezing or swollen glands. Past treatments include oral decongestants. The treatment provided mild relief.    Past Medical History:  Diagnosis Date  . Anemia    only with pregnancy  . GERD (gastroesophageal reflux disease)   . Headache    " I just go to bed when I have a headache"  . Hypertension    Social History   Social History  . Marital status: Single    Spouse name: N/A  . Number of children: N/A  . Years of education: N/A   Occupational History  . Not on file.   Social History Main Topics  . Smoking status: Never Smoker  . Smokeless tobacco: Never Used  . Alcohol use No     Comment: quit 03/2016  . Drug use: No  . Sexual activity: Yes    Birth control/ protection: None   Other Topics Concern  . Not on file   Social History Narrative  . No narrative on file    . Review of Systems  Constitutional: Positive for fatigue.  HENT: Positive for congestion, postnasal drip, rhinorrhea, sinus pressure and sore throat. Negative for sneezing.   Eyes: Positive for redness.  Respiratory: Negative.   Cardiovascular: Negative.   Gastrointestinal: Negative.   Endocrine: Negative.   Genitourinary: Negative.   Musculoskeletal: Negative.   Skin: Negative.   Allergic/Immunologic: Negative.   Neurological: Positive for headaches.  Hematological: Negative.   Psychiatric/Behavioral: Negative.        Objective:   Physical Exam  Constitutional: She is oriented to person, place, and time. She has a sickly appearance.  HENT:  Nose: Sinus tenderness present. Right sinus exhibits maxillary sinus tenderness. Left  sinus exhibits maxillary sinus tenderness.  Mouth/Throat: Oropharyngeal exudate and posterior oropharyngeal erythema present.  Neck: Normal range of motion. Neck supple.  Abdominal: Soft. Bowel sounds are normal.  Neurological: She is alert and oriented to person, place, and time. She has normal reflexes.  Psychiatric: She has a normal mood and affect. Her behavior is normal. Judgment and thought content normal.      BP 132/87 (BP Location: Left Arm, Patient Position: Sitting, Cuff Size: Normal)   Pulse 80   Temp 98 F (36.7 C) (Oral)   Resp 16   Ht 5\' 4"  (1.626 m)   Wt 143 lb (64.9 kg)   LMP 07/15/2016   SpO2 99%   BMI 24.55 kg/m  Assessment & Plan:  1. Acute non-recurrent maxillary sinusitis Increase vitamin C intake, rest, and handwashing.  - azithromycin (ZITHROMAX) 250 MG tablet; Take 500 mg today; days 2-5 250 mg daily  Dispense: 6 tablet; Refill: 0 - cetirizine (ZYRTEC) 10 MG tablet; Take 1 tablet (10 mg total) by mouth daily.  Dispense: 30 tablet; Refill: 11  2. Post-nasal drip - cetirizine (ZYRTEC) 10 MG tablet; Take 1 tablet (10 mg total) by mouth daily.  Dispense: 30 tablet; Refill: 11  3. Nasal congestion - azithromycin (ZITHROMAX) 250 MG tablet; Take 500 mg today; days 2-5 250 mg daily  Dispense: 6 tablet; Refill: 0 - cetirizine (ZYRTEC) 10 MG tablet; Take 1 tablet (10 mg total) by  mouth daily.  Dispense: 30 tablet; Refill: 11  4. Sinus pain - azithromycin (ZITHROMAX) 250 MG tablet; Take 500 mg today; days 2-5 250 mg daily  Dispense: 6 tablet; Refill: 0 - cetirizine (ZYRTEC) 10 MG tablet; Take 1 tablet (10 mg total) by mouth daily.  Dispense: 30 tablet; Refill: 11   RTC: As previously scheduled  Massie Maroon, FNP

## 2016-09-26 ENCOUNTER — Encounter: Payer: Self-pay | Admitting: Family Medicine

## 2016-09-26 ENCOUNTER — Ambulatory Visit (HOSPITAL_COMMUNITY)
Admission: RE | Admit: 2016-09-26 | Discharge: 2016-09-26 | Disposition: A | Discharge status: Home / Self Care | Payer: Self-pay | Source: Ambulatory Visit | Attending: Family Medicine | Admitting: Family Medicine

## 2016-09-26 ENCOUNTER — Ambulatory Visit (INDEPENDENT_AMBULATORY_CARE_PROVIDER_SITE_OTHER): Payer: Self-pay | Admitting: Family Medicine

## 2016-09-26 VITALS — BP 152/98 | HR 72 | Temp 97.8°F | Resp 14 | Ht 64.0 in | Wt 147.0 lb

## 2016-09-26 DIAGNOSIS — G8929 Other chronic pain: Secondary | ICD-10-CM | POA: Insufficient documentation

## 2016-09-26 DIAGNOSIS — M25511 Pain in right shoulder: Secondary | ICD-10-CM | POA: Insufficient documentation

## 2016-09-26 DIAGNOSIS — I10 Essential (primary) hypertension: Secondary | ICD-10-CM

## 2016-09-26 LAB — POCT URINALYSIS DIP (DEVICE)
Bilirubin Urine: NEGATIVE
Glucose, UA: NEGATIVE mg/dL
Ketones, ur: NEGATIVE mg/dL
Leukocytes, UA: NEGATIVE
Nitrite: NEGATIVE
PH: 5.5 (ref 5.0–8.0)
PROTEIN: NEGATIVE mg/dL
Specific Gravity, Urine: 1.025 (ref 1.005–1.030)
Urobilinogen, UA: 0.2 mg/dL (ref 0.0–1.0)

## 2016-09-26 MED ORDER — KETOROLAC TROMETHAMINE 60 MG/2ML IM SOLN
30.0000 mg | Freq: Once | INTRAMUSCULAR | Status: AC
  Administered 2016-09-26: 30 mg via INTRAMUSCULAR

## 2016-09-26 NOTE — Patient Instructions (Addendum)
Refrain from lifting greater than 20 pounds Apply warm, moist compresses to right shoulder Will obtain xray     Shoulder Pain Many things can cause shoulder pain, including:  An injury.  Moving the arm in the same way again and again (overuse).  Joint pain (arthritis). Follow these instructions at home: Take these actions to help with your pain:  Squeeze a soft ball or a foam pad as much as you can. This helps to prevent swelling. It also makes the arm stronger.  Take over-the-counter and prescription medicines only as told by your doctor.  If told, put ice on the area:  Put ice in a plastic bag.  Place a towel between your skin and the bag.  Leave the ice on for 20 minutes, 2-3 times per day. Stop putting on ice if it does not help with the pain.  If you were given a shoulder sling or immobilizer:  Wear it as told.  Remove it to shower or bathe.  Move your arm as little as possible.  Keep your hand moving. This helps prevent swelling. Contact a doctor if:  Your pain gets worse.  Medicine does not help your pain.  You have new pain in your arm, hand, or fingers. Get help right away if:  Your arm, hand, or fingers:  Tingle.  Are numb.  Are swollen.  Are painful.  Turn white or blue. This information is not intended to replace advice given to you by your health care provider. Make sure you discuss any questions you have with your health care provider. Document Released: 10/19/2007 Document Revised: 12/27/2015 Document Reviewed: 08/25/2014 Elsevier Interactive Patient Education  2017 [employer]

## 2016-09-26 NOTE — Progress Notes (Signed)
Jessica Garrett, a 46 year old female with a history of hypertension presents with right shoulder pain for greater than 1 year. 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 She says that her right shoulder "pops out of place" periodically. She maintains that it becomes difficult to reach above her head or rotate arms. Pain intensity is currently 3/10 and is described as intermittent and aching.    Shoulder Pain   The pain is present in the right shoulder. This is a chronic problem. The current episode started more than 1 year ago. There has been no history of extremity trauma. The problem occurs intermittently. The pain is at a severity of 3/10. Associated symptoms include joint locking and a limited range of motion. Pertinent negatives include no fever or itching. She has tried NSAIDS for the symptoms. The treatment provided mild relief. Family history does not include gout or rheumatoid arthritis. There is no history of diabetes or osteoarthritis.   Past Medical History:  Diagnosis Date  . Anemia    only with pregnancy  . GERD (gastroesophageal reflux disease)   . Headache    " I just go to bed when I have a headache"  . Hypertension     Social History   Social History  . Marital status: Single    Spouse name: N/A  . Number of children: N/A  . Years of education: N/A   Occupational History  . Not on file.   Social History Main Topics  . Smoking status: Never Smoker  . Smokeless tobacco: Never Used  . Alcohol use No     Comment: quit 03/2016  . Drug use: No  . Sexual activity: Yes    Birth control/ protection: None   Other Topics Concern  . Not on file   Social History Narrative  . No narrative on file  Review of Systems  Constitutional: Negative.  Negative for fever.  HENT: Negative.   Eyes: Negative.   Cardiovascular: Negative.   Gastrointestinal: Negative.   Genitourinary: Negative.   Musculoskeletal: Positive for  joint pain (right shoulder).  Skin: Negative.  Negative for itching.  Neurological: Negative.  Negative for weakness.  Endo/Heme/Allergies: Negative.   Psychiatric/Behavioral: Negative.    Physical Exam  HENT:  Head: Normocephalic and atraumatic.  Right Ear: External ear normal.  Left Ear: External ear normal.  Nose: Nose normal.  Mouth/Throat: Oropharynx is clear and moist.  Eyes: Conjunctivae and EOM are normal. Pupils are equal, round, and reactive to light.  Neck: Normal range of motion. Neck supple.  Cardiovascular: Normal rate, regular rhythm, normal heart sounds and intact distal pulses.   Pulmonary/Chest: Effort normal and breath sounds normal.  Abdominal: Soft. Bowel sounds are normal.  Musculoskeletal:       Right shoulder: She exhibits decreased range of motion, tenderness, pain and decreased strength (3/5). She exhibits no swelling, no crepitus and no spasm.  Patient is guarding right shoulder, limited physical exam.    BP (!) 152/98 (BP Location: Right Arm, Patient Position: Sitting, Cuff Size: Normal) Comment: manual  Pulse 72   Temp 97.8 F (36.6 C) (Oral)   Resp 14   Ht 5\' 4"  (1.626 m)   Wt 147 lb (66.7 kg)   LMP 09/15/2016   SpO2 100%   BMI 25.23 kg/m    Plan   1. Chronic right shoulder pain Refrain from lifting greater than 20 pounds Apply warm compresses as needed, can use interchangeably  with cold compresses.  - DG Shoulder Right; Future - C-reactive protein - Sedimentation Rate - ketorolac (TORADOL) injection 30 mg; Inject 1 mL (30 mg total) into the muscle once. - AMB referral to orthopedics  2. Essential hypertension Patient discontinued anti-hypertensive medications 1 months ago. Blood pressure is above goal, will restart amlodipine 5 mg daily. Patient to return in 1 month for a blood pressure check.  - Basic Metabolic Panel - amLODipine (NORVASC) 5 MG tablet; Take 1 tablet (5 mg total) by mouth daily.  Dispense: 90 tablet; Refill: 1  RTC: 1  month for hypertension   Nolon NationsLaChina Moore Colie Josten  MSN, FNP-C [facility] Patient [facility] [address]  [place], [place] [id number] [phone]

## 2016-09-27 ENCOUNTER — Telehealth: Payer: Self-pay

## 2016-09-27 DIAGNOSIS — I10 Essential (primary) hypertension: Secondary | ICD-10-CM

## 2016-09-27 LAB — C-REACTIVE PROTEIN: CRP: 2.6 mg/L (ref <8.0)

## 2016-09-27 LAB — BASIC METABOLIC PANEL
BUN: 14 mg/dL (ref 7–25)
CALCIUM: 9.6 mg/dL (ref 8.6–10.2)
CO2: 24 mmol/L (ref 20–31)
Chloride: 104 mmol/L (ref 98–110)
Creat: 0.85 mg/dL (ref 0.50–1.10)
GLUCOSE: 109 mg/dL — AB (ref 65–99)
Potassium: 4.8 mmol/L (ref 3.5–5.3)
SODIUM: 138 mmol/L (ref 135–146)

## 2016-09-27 LAB — SEDIMENTATION RATE: SED RATE: 1 mm/h (ref 0–20)

## 2016-09-27 MED ORDER — AMLODIPINE BESYLATE 5 MG PO TABS: 5.0000 mg | ORAL_TABLET | Freq: Every day | ORAL | 1 refills | Status: DC

## 2016-09-27 MED ORDER — AMLODIPINE BESYLATE 5 MG PO TABS: 5.0000 mg | ORAL_TABLET | Freq: Every day | ORAL | 5 refills | Status: DC

## 2016-09-27 MED FILL — AMLODIPINE BESYLATE 5 MG TA: 5 | 30 days supply | Qty: 30 | Fill #0

## 2016-09-27 NOTE — Telephone Encounter (Signed)
[facility]. Thanks!

## 2016-10-13 ENCOUNTER — Ambulatory Visit (INDEPENDENT_AMBULATORY_CARE_PROVIDER_SITE_OTHER): Payer: Self-pay | Admitting: Orthopedic Surgery

## 2016-10-28 ENCOUNTER — Encounter: Payer: Self-pay | Admitting: Family Medicine

## 2016-10-28 ENCOUNTER — Ambulatory Visit (INDEPENDENT_AMBULATORY_CARE_PROVIDER_SITE_OTHER): Payer: Self-pay | Admitting: Family Medicine

## 2016-10-28 VITALS — BP 127/85 | HR 67 | Temp 97.9°F | Resp 16 | Ht 64.0 in | Wt 149.0 lb

## 2016-10-28 DIAGNOSIS — I10 Essential (primary) hypertension: Secondary | ICD-10-CM

## 2016-10-28 LAB — POCT URINALYSIS DIP (DEVICE)
BILIRUBIN URINE: NEGATIVE
Glucose, UA: NEGATIVE mg/dL
Ketones, ur: NEGATIVE mg/dL
LEUKOCYTES UA: NEGATIVE
NITRITE: NEGATIVE
Protein, ur: NEGATIVE mg/dL
Specific Gravity, Urine: <=1.005 (ref 1.005–1.030)
Urobilinogen, UA: 0.2 mg/dL (ref 0.0–1.0)
pH: 5.5 (ref 5.0–8.0)

## 2016-10-28 MED ORDER — AMLODIPINE BESYLATE 5 MG PO TABS: 5.0000 mg | ORAL_TABLET | Freq: Every day | ORAL | 1 refills | Status: DC

## 2016-10-28 NOTE — Progress Notes (Signed)
Jessica Garrett, a 46 year old female with a history of hypertension presents for a 1 month follow up after restarting antihypertensive medication. She says that she has been taking medication consistently. She does not exercise or follow a low fat/low sodium diet.    Hypertension  This is a chronic problem. The current episode started more than 1 month ago. The problem has been gradually improving since onset. The problem is controlled. Pertinent negatives include no blurred vision, chest pain, headaches, neck pain, orthopnea, palpitations, peripheral edema, PND or sweats. Risk factors for coronary artery disease include sedentary lifestyle. The current treatment provides moderate improvement. Compliance problems include diet and exercise.    Past Medical History:  Diagnosis Date  . Anemia    only with pregnancy  . GERD (gastroesophageal reflux disease)   . Headache    " I just go to bed when I have a headache"  . Hypertension     Social History   Social History  . Marital status: Single    Spouse name: N/A  . Number of children: N/A  . Years of education: N/A   Occupational History  . Not on file.   Social History Main Topics  . Smoking status: Never Smoker  . Smokeless tobacco: Never Used  . Alcohol use No     Comment: quit 03/2016  . Drug use: No  . Sexual activity: Yes    Birth control/ protection: None   Other Topics Concern  . Not on file   Social History Narrative  . No narrative on file  Review of Systems  Constitutional: Negative.   HENT: Negative.   Eyes: Negative.  Negative for blurred vision.  Cardiovascular: Negative.  Negative for chest pain, palpitations, orthopnea and PND.  Gastrointestinal: Negative.   Genitourinary: Negative.   Musculoskeletal: Negative for neck pain.  Skin: Negative.   Neurological: Negative.  Negative for weakness and headaches.  Endo/Heme/Allergies: Negative.   Psychiatric/Behavioral: Negative.    Physical Exam   Constitutional: She is oriented to person, place, and time.  HENT:  Head: Normocephalic and atraumatic.  Right Ear: External ear normal.  Left Ear: External ear normal.  Nose: Nose normal.  Mouth/Throat: Oropharynx is clear and moist.  Eyes: Conjunctivae and EOM are normal. Pupils are equal, round, and reactive to light.  Neck: Normal range of motion. Neck supple.  Cardiovascular: Normal rate, regular rhythm, normal heart sounds and intact distal pulses.   Pulmonary/Chest: Effort normal and breath sounds normal.  Abdominal: Soft. Bowel sounds are normal.  Musculoskeletal:       Right shoulder: She exhibits no swelling, no crepitus and no spasm.  Neurological: She is alert and oriented to person, place, and time. Gait normal.  Skin: Skin is warm and dry.  Psychiatric: Memory, affect and judgment normal.   BP 127/85 (BP Location: Left Arm, Patient Position: Sitting, Cuff Size: Normal)   Pulse 67   Temp 97.9 F (36.6 C) (Oral)   Resp 16   Ht 5\' 4"  (1.626 m)   Wt 149 lb (67.6 kg)   LMP 10/14/2016   SpO2 100%   BMI 25.58 kg/m    Plan    Essential hypertension Blood pressure is above goal on current medication regimen.  Discussed the importance of starting a low/fat, low sodium diet.  Reviewed urinalysis, no proteinuria present.  The 10-year ASCVD risk score Denman George(Goff DC Jr., et al., 2013) is: 2.7%   Values used to calculate the score:     Age: [age]  years     Sex: Female     Is Non-Hispanic African American: Yes     Diabetic: No     Tobacco smoker: No     Systolic Blood Pressure: 127 mmHg     Is BP treated: Yes     HDL Cholesterol: 54 mg/dL     Total Cholesterol: 182 mg/dL - amLODipine (NORVASC) 5 MG tablet; Take 1 tablet (5 mg total) by mouth daily.  Dispense: 90 tablet; Refill: 1   RTC: 6 months for hypertension   Nolon Nations  MSN, FNP-C [facility] Patient [facility] [address] [place], [place] [address] [phone]

## 2016-10-28 NOTE — Patient Instructions (Addendum)
DASH Eating Plan DASH stands for "Dietary Approaches to Stop Hypertension." The DASH eating plan is a healthy eating plan that has been shown to reduce high blood pressure (hypertension). It may also reduce your risk for type 2 diabetes, heart disease, and stroke. The DASH eating plan may also help with weight loss. What are tips for following this plan? General guidelines  Avoid eating more than 2,300 mg (milligrams) of salt (sodium) a day. If you have hypertension, you may need to reduce your sodium intake to 1,500 mg a day.  Limit alcohol intake to no more than 1 drink a day for nonpregnant women and 2 drinks a day for men. One drink equals 12 oz of beer, 5 oz of wine, or 1 oz of hard liquor.  Work with your health care provider to maintain a healthy body weight or to lose weight. Ask what an ideal weight is for you.  Get at least 30 minutes of exercise that causes your heart to beat faster (aerobic exercise) most days of the week. Activities may include walking, swimming, or biking.  Work with your health care provider or diet and nutrition specialist (dietitian) to adjust your eating plan to your individual calorie needs. Reading food labels  Check food labels for the amount of sodium per serving. Choose foods with less than 5 percent of the Daily Value of sodium. Generally, foods with less than 300 mg of sodium per serving fit into this eating plan.  To find whole grains, look for the word "whole" as the first word in the ingredient list. Shopping  Buy products labeled as "low-sodium" or "no salt added."  Buy fresh foods. Avoid canned foods and premade or frozen meals. Cooking  Avoid adding salt when cooking. Use salt-free seasonings or herbs instead of table salt or sea salt. Check with your health care provider or pharmacist before using salt substitutes.  Do not fry foods. Cook foods using healthy methods such as baking, boiling, grilling, and broiling instead.  Cook with  heart-healthy oils, such as olive, canola, soybean, or sunflower oil. Meal planning   Eat a balanced diet that includes: ? 5 or more servings of fruits and vegetables each day. At each meal, try to fill half of your plate with fruits and vegetables. ? Up to 6-8 servings of whole grains each day. ? Less than 6 oz of lean meat, poultry, or fish each day. A 3-oz serving of meat is about the same size as a deck of cards. One egg equals 1 oz. ? 2 servings of low-fat dairy each day. ? A serving of nuts, seeds, or beans 5 times each week. ? Heart-healthy fats. Healthy fats called Omega-3 fatty acids are found in foods such as flaxseeds and coldwater fish, like sardines, salmon, and mackerel.  Limit how much you eat of the following: ? Canned or prepackaged foods. ? Food that is high in trans fat, such as fried foods. ? Food that is high in saturated fat, such as fatty meat. ? Sweets, desserts, sugary drinks, and other foods with added sugar. ? Full-fat dairy products.  Do not salt foods before eating.  Try to eat at least 2 vegetarian meals each week.  Eat more home-cooked food and less restaurant, buffet, and fast food.  When eating at a restaurant, ask that your food be prepared with less salt or no salt, if possible. What foods are recommended? The items listed may not be a complete list. Talk with your dietitian about what   dietary choices are best for you. Grains Whole-grain or whole-wheat bread. Whole-grain or whole-wheat pasta. Brown rice. Oatmeal. Quinoa. Bulgur. Whole-grain and low-sodium cereals. Pita bread. Low-fat, low-sodium crackers. Whole-wheat flour tortillas. Vegetables Fresh or frozen vegetables (raw, steamed, roasted, or grilled). Low-sodium or reduced-sodium tomato and vegetable juice. Low-sodium or reduced-sodium tomato sauce and tomato paste. Low-sodium or reduced-sodium canned vegetables. Fruits All fresh, dried, or frozen fruit. Canned fruit in natural juice (without  added sugar). Meat and other protein foods Skinless chicken or turkey. Ground chicken or turkey. Pork with fat trimmed off. Fish and seafood. Egg whites. Dried beans, peas, or lentils. Unsalted nuts, nut butters, and seeds. Unsalted canned beans. Lean cuts of beef with fat trimmed off. Low-sodium, lean deli meat. Dairy Low-fat (1%) or fat-free (skim) milk. Fat-free, low-fat, or reduced-fat cheeses. Nonfat, low-sodium ricotta or cottage cheese. Low-fat or nonfat yogurt. Low-fat, low-sodium cheese. Fats and oils Soft margarine without trans fats. Vegetable oil. Low-fat, reduced-fat, or light mayonnaise and salad dressings (reduced-sodium). Canola, safflower, olive, soybean, and sunflower oils. Avocado. Seasoning and other foods Herbs. Spices. Seasoning mixes without salt. Unsalted popcorn and pretzels. Fat-free sweets. What foods are not recommended? The items listed may not be a complete list. Talk with your dietitian about what dietary choices are best for you. Grains Baked goods made with fat, such as croissants, muffins, or some breads. Dry pasta or rice meal packs. Vegetables Creamed or fried vegetables. Vegetables in a cheese sauce. Regular canned vegetables (not low-sodium or reduced-sodium). Regular canned tomato sauce and paste (not low-sodium or reduced-sodium). Regular tomato and vegetable juice (not low-sodium or reduced-sodium). Pickles. Olives. Fruits Canned fruit in a light or heavy syrup. Fried fruit. Fruit in cream or butter sauce. Meat and other protein foods Fatty cuts of meat. Ribs. Fried meat. Bacon. Sausage. Bologna and other processed lunch meats. Salami. Fatback. Hotdogs. Bratwurst. Salted nuts and seeds. Canned beans with added salt. Canned or smoked fish. Whole eggs or egg yolks. Chicken or turkey with skin. Dairy Whole or 2% milk, cream, and half-and-half. Whole or full-fat cream cheese. Whole-fat or sweetened yogurt. Full-fat cheese. Nondairy creamers. Whipped toppings.  Processed cheese and cheese spreads. Fats and oils Butter. Stick margarine. Lard. Shortening. Ghee. Bacon fat. Tropical oils, such as coconut, palm kernel, or palm oil. Seasoning and other foods Salted popcorn and pretzels. Onion salt, garlic salt, seasoned salt, table salt, and sea salt. Worcestershire sauce. Tartar sauce. Barbecue sauce. Teriyaki sauce. Soy sauce, including reduced-sodium. Steak sauce. Canned and packaged gravies. Fish sauce. Oyster sauce. Cocktail sauce. Horseradish that you find on the shelf. Ketchup. Mustard. Meat flavorings and tenderizers. Bouillon cubes. Hot sauce and Tabasco sauce. Premade or packaged marinades. Premade or packaged taco seasonings. Relishes. Regular salad dressings. Where to find more information:  National Heart, Lung, and Blood Institute: www.nhlbi.nih.gov  American Heart Association: www.heart.org Summary  The DASH eating plan is a healthy eating plan that has been shown to reduce high blood pressure (hypertension). It may also reduce your risk for type 2 diabetes, heart disease, and stroke.  With the DASH eating plan, you should limit salt (sodium) intake to 2,300 mg a day. If you have hypertension, you may need to reduce your sodium intake to 1,500 mg a day.  When on the DASH eating plan, aim to eat more fresh fruits and vegetables, whole grains, lean proteins, low-fat dairy, and heart-healthy fats.  Work with your health care provider or diet and nutrition specialist (dietitian) to adjust your eating plan to your individual   calorie needs. This information is not intended to replace advice given to you by your health care provider. Make sure you discuss any questions you have with your health care provider. Document Released: 04/21/2011 Document Revised: 04/25/2016 Document Reviewed: 04/25/2016 Elsevier Interactive Patient Education  2017 [employer].  

## 2016-10-31 MED FILL — ?AMLODIPINE BESYLATE 5 MG T: 5 | 30 days supply | Qty: 30 | Fill #0

## 2017-05-01 ENCOUNTER — Ambulatory Visit (INDEPENDENT_AMBULATORY_CARE_PROVIDER_SITE_OTHER): Payer: Self-pay | Admitting: Family Medicine

## 2017-05-01 ENCOUNTER — Encounter: Payer: Self-pay | Admitting: Family Medicine

## 2017-05-01 VITALS — BP 154/90 | HR 72 | Temp 98.3°F | Resp 16 | Ht 64.0 in | Wt 164.0 lb

## 2017-05-01 DIAGNOSIS — F32A Depression, unspecified: Secondary | ICD-10-CM

## 2017-05-01 DIAGNOSIS — F411 Generalized anxiety disorder: Secondary | ICD-10-CM

## 2017-05-01 DIAGNOSIS — R635 Abnormal weight gain: Secondary | ICD-10-CM

## 2017-05-01 DIAGNOSIS — I10 Essential (primary) hypertension: Secondary | ICD-10-CM

## 2017-05-01 DIAGNOSIS — F329 Major depressive disorder, single episode, unspecified: Secondary | ICD-10-CM

## 2017-05-01 MED ORDER — AMLODIPINE BESYLATE 5 MG PO TABS: 5.0000 mg | ORAL_TABLET | Freq: Every day | ORAL | 1 refills | Status: DC

## 2017-05-01 MED ORDER — BUSPIRONE HCL 5 MG PO TABS: 5.0000 mg | ORAL_TABLET | Freq: Three times a day (TID) | ORAL | 5 refills | Status: DC

## 2017-05-01 MED FILL — busPIRone HCL 5 MG TABS: 5 | 20 days supply | Qty: 60 | Fill #0

## 2017-05-01 MED FILL — AMLODIPINE BESYLATE 5 MG TA: 5 | 30 days supply | Qty: 30 | Fill #0

## 2017-05-01 NOTE — Progress Notes (Signed)
HPI   Jessica Garrett,a 46 year old patient with a history of hypertension presents for follow up. She says that she has been out of medication over the past few weeks due to financial constraints. She has been unable to exercise routinely and does not follow a low sodium diet.  Patient denies chest pain, dyspnea, fatigue, palpitations, syncope and tachypnea.    Patient is also complaining of anxiety and depression. She says that she is in the process of losing her house and has had increased stress over the past several months. She says that she provides housing for her entire family and is afraid of being homeless.   She has the following symptoms: difficulty concentrating, feelings of losing control, insomnia, irritable, psychomotor agitation, racing thoughts.  She denies current suicidal and homicidal ideation.   Past Medical History:  Diagnosis Date  . Anemia    only with pregnancy  . GERD (gastroesophageal reflux disease)   . Headache    " I just go to bed when I have a headache"  . Hypertension     Social History   Socioeconomic History  . Marital status: Single    Spouse name: Not on file  . Number of children: Not on file  . Years of education: Not on file  . Highest education level: Not on file  Social Needs  . Financial resource strain: Not on file  . Food insecurity - worry: Not on file  . Food insecurity - inability: Not on file  . Transportation needs - medical: Not on file  . Transportation needs - non-medical: Not on file  Occupational History  . Not on file  Tobacco Use  . Smoking status: Never Smoker  . Smokeless tobacco: Never Used  Substance and Sexual Activity  . Alcohol use: No    Comment: quit 03/2016  . Drug use: No  . Sexual activity: Yes    Birth control/protection: None  Other Topics Concern  . Not on file  Social History Narrative  . Not on file  Review of Systems  Constitutional: Negative.   HENT: Negative.   Eyes: Negative.  Negative for  blurred vision.  Respiratory: Negative.   Cardiovascular: Negative.  Negative for chest pain, palpitations, orthopnea and PND.  Gastrointestinal: Negative.   Genitourinary: Negative.   Musculoskeletal: Negative.  Negative for neck pain.  Skin: Negative.   Neurological: Negative.  Negative for weakness and headaches.  Endo/Heme/Allergies: Negative.   Psychiatric/Behavioral: Positive for depression. Negative for memory loss, substance abuse and suicidal ideas. The patient is nervous/anxious.    Physical Exam  Constitutional: She is oriented to person, place, and time.  HENT:  Head: Normocephalic and atraumatic.  Right Ear: External ear normal.  Left Ear: External ear normal.  Nose: Nose normal.  Mouth/Throat: Oropharynx is clear and moist.  Eyes: Conjunctivae and EOM are normal. Pupils are equal, round, and reactive to light.  Neck: Normal range of motion. Neck supple.  Cardiovascular: Normal rate, regular rhythm, normal heart sounds and intact distal pulses.  Pulmonary/Chest: Effort normal and breath sounds normal.  Abdominal: Soft. Bowel sounds are normal.  Musculoskeletal:       Right shoulder: She exhibits no swelling, no crepitus and no spasm.  Neurological: She is alert and oriented to person, place, and time. Gait normal.  Skin: Skin is warm and dry.  Psychiatric: Memory, affect and judgment normal.   BP (!) 154/90 (BP Location: Right Arm, Patient Position: Sitting, Cuff Size: Normal) Comment: manually  Pulse 72  Temp 98.3 F (36.8 C) (Oral)   Resp 16   Ht 5\' 4"  (1.626 m)   Wt 164 lb (74.4 kg)   LMP 04/16/2017   SpO2 99%   BMI 28.15 kg/m    Plan   Essential hypertension Blood pressure is above goal on current medication regimen. There have been some compliance issues here. I have discussed with her the great importance of following the treatment plan exactly as directed in order to achieve a good medical outcome. - Continue medication, monitor blood pressure at  home. Continue DASH diet. Reminder to go to the ER if any CP, SOB, nausea, dizziness, severe HA, changes vision/speech, left arm numbness and tingling and jaw pain.    - Urinalysis, Routine w reflex microscopic - amLODipine (NORVASC) 5 MG tablet; Take 1 tablet (5 mg total) by mouth daily.  Dispense: 90 tablet; Refill: 1 - Basic Metabolic Panel   Generalized anxiety disorder GAD 7 : Generalized Anxiety Score 05/01/2017  Nervous, Anxious, on Edge 3  Control/stop worrying 2  Worry too much - different things 2  Trouble relaxing 2  Restless 0  Easily annoyed or irritable 2  Afraid - awful might happen 1  Total GAD 7 Score 12  Anxiety Difficulty Somewhat difficult    - busPIRone (BUSPAR) 5 MG tablet; Take 1 tablet (5 mg total) by mouth 3 (three) times daily.  Dispense: 60 tablet; Refill: 5   Depression, unspecified depression type [facility] 05/01/2017 05/01/2017 10/28/2016 09/26/2016 07/20/2016  Decreased Interest 2 0 0 1 0  Down, Depressed, Hopeless 1 1 0 0 0  PHQ - 2 Score 3 1 0 1 0  Altered sleeping 2 - - - -  Tired, decreased energy 2 - - - -  Change in appetite 0 - - - -  Feeling bad or failure about yourself  1 - - - -  Suicidal thoughts 0 - - - -  PHQ-9 Score 8 - - - -   - busPIRone (BUSPAR) 5 MG tablet; Take 1 tablet (5 mg total) by mouth 3 (three) times daily.  Dispense: 60 tablet; Refill: 5   Weight gain Recommend a lowfat, low carbohydrate diet divided over 5-6 small meals, increase water intake to 6-8 glasses, and 150 minutes per week of cardiovascular exercise.   - TSH - HgB A1c   RTC: 2 months for depression and anxiety  Nolon NationsLaChina Moore Hollis  MSN, FNP-C [facility] [address]  [place], [place] [id number] [phone]

## 2017-05-01 NOTE — Patient Instructions (Signed)
Will start a trial of buspar 5 mg twice daily for generalized anxiety disorder.   Generalized Anxiety Disorder, Adult Generalized anxiety disorder (GAD) is a mental health disorder. People with this condition constantly worry about everyday events. Unlike normal anxiety, worry related to GAD is not triggered by a specific event. These worries also do not fade or get better with time. GAD interferes with life functions, including relationships, work, and school. GAD can vary from mild to severe. People with severe GAD can have intense waves of anxiety with physical symptoms (panic attacks). What are the causes? The exact cause of GAD is not known. What increases the risk? This condition is more likely to develop in:  Women.  People who have a family history of anxiety disorders.  People who are very shy.  People who experience very stressful life events, such as the death of a loved one.  People who have a very stressful family environment.  What are the signs or symptoms? People with GAD often worry excessively about many things in their lives, such as their health and family. They may also be overly concerned about:  Doing well at work.  Being on time.  Natural disasters.  Friendships.  Physical symptoms of GAD include:  Fatigue.  Muscle tension or having muscle twitches.  Trembling or feeling shaky.  Being easily startled.  Feeling like your heart is pounding or racing.  Feeling out of breath or like you cannot take a deep breath.  Having trouble falling asleep or staying asleep.  Sweating.  Nausea, diarrhea, or irritable bowel syndrome (IBS).  Headaches.  Trouble concentrating or remembering facts.  Restlessness.  Irritability.  How is this diagnosed? Your health care provider can diagnose GAD based on your symptoms and medical history. You will also have a physical exam. The health care provider will ask specific questions about your symptoms, including  how severe they are, when they started, and if they come and go. Your health care provider may ask you about your use of alcohol or drugs, including prescription medicines. Your health care provider may refer you to a mental health specialist for further evaluation. Your health care provider will do a thorough examination and may perform additional tests to rule out other possible causes of your symptoms. To be diagnosed with GAD, a person must have anxiety that:  Is out of his or her control.  Affects several different aspects of his or her life, such as work and relationships.  Causes distress that makes him or her unable to take part in normal activities.  Includes at least three physical symptoms of GAD, such as restlessness, fatigue, trouble concentrating, irritability, muscle tension, or sleep problems.  Before your health care provider can confirm a diagnosis of GAD, these symptoms must be present more days than they are not, and they must last for six months or longer. How is this treated? The following therapies are usually used to treat GAD:  Medicine. Antidepressant medicine is usually prescribed for long-term daily control. Antianxiety medicines may be added in severe cases, especially when panic attacks occur.  Talk therapy (psychotherapy). Certain types of talk therapy can be helpful in treating GAD by providing support, education, and guidance. Options include: ? Cognitive behavioral therapy (CBT). People learn coping skills and techniques to ease their anxiety. They learn to identify unrealistic or negative thoughts and behaviors and to replace them with positive ones. ? Acceptance and commitment therapy (ACT). This treatment teaches people how to be mindful as  a way to cope with unwanted thoughts and feelings. ? Biofeedback. This process trains you to manage your body's response (physiological response) through breathing techniques and relaxation methods. You will work with a  therapist while machines are used to monitor your physical symptoms.  Stress management techniques. These include yoga, meditation, and exercise.  A mental health specialist can help determine which treatment is best for you. Some people see improvement with one type of therapy. However, other people require a combination of therapies. Follow these instructions at home:  Take over-the-counter and prescription medicines only as told by your health care provider.  Try to maintain a normal routine.  Try to anticipate stressful situations and allow extra time to manage them.  Practice any stress management or self-calming techniques as taught by your health care provider.  Do not punish yourself for setbacks or for not making progress.  Try to recognize your accomplishments, even if they are small.  Keep all follow-up visits as told by your health care provider. This is important. Contact a health care provider if:  Your symptoms do not get better.  Your symptoms get worse.  You have signs of depression, such as: ? A persistently sad, cranky, or irritable mood. ? Loss of enjoyment in activities that used to bring you joy. ? Change in weight or eating. ? Changes in sleeping habits. ? Avoiding friends or family members. ? Loss of energy for normal tasks. ? Feelings of guilt or worthlessness. Get help right away if:  You have serious thoughts about hurting yourself or others. If you ever feel like you may hurt yourself or others, or have thoughts about taking your own life, get help right away. You can go to your nearest emergency department or call:  Your local emergency services (911 in the [place]  A suicide crisis helpline, such as the National Suicide Prevention Lifeline at 406-605-36421-(763) 744-4986. This is open 24 hours a day.  Summary  Generalized anxiety disorder (GAD) is a mental health disorder that involves worry that is not triggered by a specific event.  People with GAD  often worry excessively about many things in their lives, such as their health and family.  GAD may cause physical symptoms such as restlessness, trouble concentrating, sleep problems, frequent sweating, nausea, diarrhea, headaches, and trembling or muscle twitching.  A mental health specialist can help determine which treatment is best for you. Some people see improvement with one type of therapy. However, other people require a combination of therapies. This information is not intended to replace advice given to you by your health care provider. Make sure you discuss any questions you have with your health care provider. Document Released: 08/27/2012 Document Revised: 03/22/2016 Document Reviewed: 03/22/2016 Elsevier Interactive Patient Education  [employer]2018 [employer]

## 2017-05-02 LAB — BASIC METABOLIC PANEL
BUN/Creatinine Ratio: 18 (ref 9–23)
BUN: 13 mg/dL (ref 6–24)
CO2: 22 mmol/L (ref 20–29)
CREATININE: 0.73 mg/dL (ref 0.57–1.00)
Calcium: 10.3 mg/dL — ABNORMAL HIGH (ref 8.7–10.2)
Chloride: 104 mmol/L (ref 96–106)
GFR, EST AFRICAN AMERICAN: 113 mL/min/{1.73_m2} (ref >59)
GFR, EST NON AFRICAN AMERICAN: 98 mL/min/{1.73_m2} (ref >59)
Glucose: 121 mg/dL — ABNORMAL HIGH (ref 65–99)
POTASSIUM: 4.5 mmol/L (ref 3.5–5.2)
SODIUM: 139 mmol/L (ref 134–144)

## 2017-05-02 LAB — URINALYSIS, ROUTINE W REFLEX MICROSCOPIC
Bilirubin, UA: NEGATIVE
GLUCOSE, UA: NEGATIVE
KETONES UA: NEGATIVE
LEUKOCYTES UA: NEGATIVE
Nitrite, UA: NEGATIVE
Protein, UA: NEGATIVE
Specific Gravity, UA: 1.021 (ref 1.005–1.030)
Urobilinogen, Ur: 0.2 mg/dL (ref 0.2–1.0)
pH, UA: 5 (ref 5.0–7.5)

## 2017-05-02 LAB — MICROSCOPIC EXAMINATION
BACTERIA UA: NONE SEEN
CASTS: NONE SEEN /LPF

## 2017-05-02 LAB — TSH: TSH: 1.5 u[IU]/mL (ref 0.450–4.500)

## 2017-05-30 MED FILL — ?AMLODIPINE BESYLATE 5 MG T: 5 MG | 30 days supply | Qty: 30 | Fill #1

## 2017-07-26 MED FILL — AMLODIPINE BESYLATE 5 MG TA: 5 | 30 days supply | Qty: 30 | Fill #2

## 2017-08-02 ENCOUNTER — Encounter: Payer: Self-pay | Admitting: Family Medicine

## 2017-08-02 ENCOUNTER — Ambulatory Visit (INDEPENDENT_AMBULATORY_CARE_PROVIDER_SITE_OTHER): Payer: Self-pay | Admitting: Family Medicine

## 2017-08-02 VITALS — BP 148/76 | HR 80 | Temp 97.7°F | Resp 14 | Ht 64.0 in | Wt 163.0 lb

## 2017-08-02 DIAGNOSIS — F32A Depression, unspecified: Secondary | ICD-10-CM

## 2017-08-02 DIAGNOSIS — F411 Generalized anxiety disorder: Secondary | ICD-10-CM

## 2017-08-02 DIAGNOSIS — I10 Essential (primary) hypertension: Secondary | ICD-10-CM

## 2017-08-02 DIAGNOSIS — F329 Major depressive disorder, single episode, unspecified: Secondary | ICD-10-CM

## 2017-08-02 DIAGNOSIS — R82998 Other abnormal findings in urine: Secondary | ICD-10-CM

## 2017-08-02 DIAGNOSIS — J3489 Other specified disorders of nose and nasal sinuses: Secondary | ICD-10-CM

## 2017-08-02 LAB — POCT URINALYSIS DIP (DEVICE)
Bilirubin Urine: NEGATIVE
GLUCOSE, UA: NEGATIVE mg/dL
Ketones, ur: NEGATIVE mg/dL
NITRITE: NEGATIVE
PROTEIN: NEGATIVE mg/dL
Specific Gravity, Urine: 1.02 (ref 1.005–1.030)
UROBILINOGEN UA: 0.2 mg/dL (ref 0.0–1.0)
pH: 5.5 (ref 5.0–8.0)

## 2017-08-02 MED ORDER — FLUTICASONE PROPIONATE 50 MCG/ACT NA SUSP: 2.0000 | Freq: Every day | NASAL | 6 refills | Status: DC

## 2017-08-02 MED ORDER — AMLODIPINE BESYLATE 5 MG PO TABS: 5.0000 mg | ORAL_TABLET | Freq: Every day | ORAL | 5 refills | Status: DC

## 2017-08-02 MED ORDER — AMLODIPINE BESYLATE 10 MG PO TABS: 10.0000 mg | ORAL_TABLET | Freq: Every day | ORAL | 5 refills | Status: DC

## 2017-08-02 MED ORDER — BUSPIRONE HCL 5 MG PO TABS: 5.0000 mg | ORAL_TABLET | Freq: Two times a day (BID) | ORAL | 5 refills | Status: DC

## 2017-08-02 NOTE — Patient Instructions (Signed)
Your blood pressure is above goal on current medication regimen, I suspect that is related to the Sudafed that you have been taking for your sinusitis.  Recommend that you refrain from taking Sudafed.  We will start a trial of Flonase 2 sprays to each nare daily.  Also, return in 1 week for blood pressure check.  We will continue BuSpar 5 mg twice daily as previously prescribed.       DASH Eating Plan DASH stands for "Dietary Approaches to Stop Hypertension." The DASH eating plan is a healthy eating plan that has been shown to reduce high blood pressure (hypertension). It may also reduce your risk for type 2 diabetes, heart disease, and stroke. The DASH eating plan may also help with weight loss. What are tips for following this plan? General guidelines  Avoid eating more than 2,300 mg (milligrams) of salt (sodium) a day. If you have hypertension, you may need to reduce your sodium intake to 1,500 mg a day.  Limit alcohol intake to no more than 1 drink a day for nonpregnant women and 2 drinks a day for men. One drink equals 12 oz of beer, 5 oz of wine, or 1 oz of hard liquor.  Work with your health care provider to maintain a healthy body weight or to lose weight. Ask what an ideal weight is for you.  Get at least 30 minutes of exercise that causes your heart to beat faster (aerobic exercise) most days of the week. Activities may include walking, swimming, or biking.  Work with your health care provider or diet and nutrition specialist (dietitian) to adjust your eating plan to your individual calorie needs. Reading food labels  Check food labels for the amount of sodium per serving. Choose foods with less than 5 percent of the Daily Value of sodium. Generally, foods with less than 300 mg of sodium per serving fit into this eating plan.  To find whole grains, look for the word "whole" as the first word in the ingredient list. Shopping  Buy products labeled as "low-sodium" or "no salt  added."  Buy fresh foods. Avoid canned foods and premade or frozen meals. Cooking  Avoid adding salt when cooking. Use salt-free seasonings or herbs instead of table salt or sea salt. Check with your health care provider or pharmacist before using salt substitutes.  Do not fry foods. Cook foods using healthy methods such as baking, boiling, grilling, and broiling instead.  Cook with heart-healthy oils, such as olive, canola, soybean, or sunflower oil. Meal planning   Eat a balanced diet that includes: ? 5 or more servings of fruits and vegetables each day. At each meal, try to fill half of your plate with fruits and vegetables. ? Up to 6-8 servings of whole grains each day. ? Less than 6 oz of lean meat, poultry, or fish each day. A 3-oz serving of meat is about the same size as a deck of cards. One egg equals 1 oz. ? 2 servings of low-fat dairy each day. ? A serving of nuts, seeds, or beans 5 times each week. ? Heart-healthy fats. Healthy fats called Omega-3 fatty acids are found in foods such as flaxseeds and coldwater fish, like sardines, salmon, and mackerel.  Limit how much you eat of the following: ? Canned or prepackaged foods. ? Food that is high in trans fat, such as fried foods. ? Food that is high in saturated fat, such as fatty meat. ? Sweets, desserts, sugary drinks, and other foods  with added sugar. ? Full-fat dairy products.  Do not salt foods before eating.  Try to eat at least 2 vegetarian meals each week.  Eat more home-cooked food and less restaurant, buffet, and fast food.  When eating at a restaurant, ask that your food be prepared with less salt or no salt, if possible. What foods are recommended? The items listed may not be a complete list. Talk with your dietitian about what dietary choices are best for you. Grains Whole-grain or whole-wheat bread. Whole-grain or whole-wheat pasta. Brown rice. [place]. Bulgur. Whole-grain and low-sodium cereals.  Pita bread. Low-fat, low-sodium crackers. Whole-wheat flour tortillas. Vegetables Fresh or frozen vegetables (raw, steamed, roasted, or grilled). Low-sodium or reduced-sodium tomato and vegetable juice. Low-sodium or reduced-sodium tomato sauce and tomato paste. Low-sodium or reduced-sodium canned vegetables. Fruits All fresh, dried, or frozen fruit. Canned fruit in natural juice (without added sugar). Meat and other protein foods Skinless chicken or [place]. Ground chicken or [place]. Pork with fat trimmed off. Fish and seafood. Egg whites. Dried beans, peas, or lentils. Unsalted nuts, nut butters, and seeds. Unsalted canned beans. Lean cuts of beef with fat trimmed off. Low-sodium, lean deli meat. Dairy Low-fat (1%) or fat-free (skim) milk. Fat-free, low-fat, or reduced-fat cheeses. Nonfat, low-sodium ricotta or cottage cheese. Low-fat or nonfat yogurt. Low-fat, low-sodium cheese. Fats and oils Soft margarine without trans fats. Vegetable oil. Low-fat, reduced-fat, or light mayonnaise and salad dressings (reduced-sodium). Canola, safflower, olive, soybean, and sunflower oils. Avocado. Seasoning and other foods Herbs. Spices. Seasoning mixes without salt. Unsalted popcorn and pretzels. Fat-free sweets. What foods are not recommended? The items listed may not be a complete list. Talk with your dietitian about what dietary choices are best for you. Grains Baked goods made with fat, such as croissants, muffins, or some breads. Dry pasta or rice meal packs. Vegetables Creamed or fried vegetables. Vegetables in a cheese sauce. Regular canned vegetables (not low-sodium or reduced-sodium). Regular canned tomato sauce and paste (not low-sodium or reduced-sodium). Regular tomato and vegetable juice (not low-sodium or reduced-sodium). Angie Fava. Olives. Fruits Canned fruit in a light or heavy syrup. Fried fruit. Fruit in cream or butter sauce. Meat and other protein foods Fatty cuts of meat. Ribs. Fried  meat. Berniece Salines. Sausage. Bologna and other processed lunch meats. Salami. Fatback. Hotdogs. Bratwurst. Salted nuts and seeds. Canned beans with added salt. Canned or smoked fish. Whole eggs or egg yolks. Chicken or [place] with skin. Dairy Whole or 2% milk, cream, and half-and-half. Whole or full-fat cream cheese. Whole-fat or sweetened yogurt. Full-fat cheese. Nondairy creamers. Whipped toppings. Processed cheese and cheese spreads. Fats and oils Butter. Stick margarine. Lard. Shortening. Ghee. Bacon fat. Tropical oils, such as coconut, palm kernel, or palm oil. Seasoning and other foods Salted popcorn and pretzels. Onion salt, garlic salt, seasoned salt, table salt, and sea salt. Worcestershire sauce. Tartar sauce. Barbecue sauce. Teriyaki sauce. Soy sauce, including reduced-sodium. Steak sauce. Canned and packaged gravies. Fish sauce. Oyster sauce. Cocktail sauce. Horseradish that you find on the shelf. Ketchup. Mustard. Meat flavorings and tenderizers. Bouillon cubes. Hot sauce and Tabasco sauce. Premade or packaged marinades. Premade or packaged taco seasonings. Relishes. Regular salad dressings. Where to find more information:  National Heart, Lung, and [place]: https://wilson-eaton.com/  American Heart Association: www.heart.org Summary  The DASH eating plan is a healthy eating plan that has been shown to reduce high blood pressure (hypertension). It may also reduce your risk for type 2 diabetes, heart disease, and stroke.  With the  DASH eating plan, you should limit salt (sodium) intake to 2,300 mg a day. If you have hypertension, you may need to reduce your sodium intake to 1,500 mg a day.  When on the DASH eating plan, aim to eat more fresh fruits and vegetables, whole grains, lean proteins, low-fat dairy, and heart-healthy fats.  Work with your health care provider or diet and nutrition specialist (dietitian) to adjust your eating plan to your individual calorie needs. This information is  not intended to replace advice given to you by your health care provider. Make sure you discuss any questions you have with your health care provider. Document Released: 04/21/2011 Document Revised: 04/25/2016 Document Reviewed: 04/25/2016 Elsevier Interactive Patient Education  [employer]2018 [employer].

## 2017-08-02 NOTE — Progress Notes (Signed)
HPI   Jessica Garrett,a 47 year old patient with a history of hypertension presents for follow up. She says that she has been out of medication over the past few weeks due to financial constraints. She has been unable to exercise routinely and does not follow a low sodium diet.  Patient denies chest pain, dyspnea, fatigue, palpitations, syncope and tachypnea.    Patient is also complaining of anxiety and depression. She says that she is attempting to sell her house an has family constraints. Her stress level has increased over the past 6 months. s in the process of losing her house and has had increased stress over the past several months.   She has the following symptoms: difficulty concentrating, feelings of losing control, insomnia, irritable, psychomotor agitation, racing thoughts.  She denies current suicidal and homicidal ideation.   Patient is complaining of a 4 day history of allergic rhinitis and sinus pressure. Patient also endorses left eye drainage several days ago. She has taken OTC sudafed with moderate improvement of symptoms. She continues to have mild sinus tenderness.   Past Medical History:  Diagnosis Date  . Anemia    only with pregnancy  . GERD (gastroesophageal reflux disease)   . Headache    " I just go to bed when I have a headache"  . Hypertension     Social History   Socioeconomic History  . Marital status: Single    Spouse name: Not on file  . Number of children: Not on file  . Years of education: Not on file  . Highest education level: Not on file  Social Needs  . Financial resource strain: Not on file  . Food insecurity - worry: Not on file  . Food insecurity - inability: Not on file  . Transportation needs - medical: Not on file  . Transportation needs - non-medical: Not on file  Occupational History  . Not on file  Tobacco Use  . Smoking status: Never Smoker  . Smokeless tobacco: Never Used  Substance and Sexual Activity  . Alcohol use: No   Comment: quit 03/2016  . Drug use: No  . Sexual activity: Yes    Birth control/protection: None  Other Topics Concern  . Not on file  Social History Narrative  . Not on file  Review of Systems  Constitutional: Negative.   HENT: Negative.        Sinus pressure  Eyes: Negative.  Negative for blurred vision.  Respiratory: Negative.   Cardiovascular: Negative.  Negative for chest pain, palpitations, orthopnea and PND.  Gastrointestinal: Negative.   Genitourinary: Negative.   Musculoskeletal: Negative.  Negative for neck pain.  Skin: Negative.   Neurological: Negative.  Negative for weakness and headaches.  Endo/Heme/Allergies: Negative.   Psychiatric/Behavioral: Positive for depression. Negative for memory loss, substance abuse and suicidal ideas. The patient is nervous/anxious.    Physical Exam  Constitutional: She is oriented to person, place, and time.  HENT:  Head: Normocephalic and atraumatic.  Right Ear: External ear normal.  Left Ear: External ear normal.  Nose: Right sinus exhibits maxillary sinus tenderness. Left sinus exhibits maxillary sinus tenderness.  Mouth/Throat: Oropharynx is clear and moist.  Eyes: Conjunctivae and EOM are normal. Pupils are equal, round, and reactive to light.  Neck: Normal range of motion. Neck supple.  Cardiovascular: Normal rate, regular rhythm, normal heart sounds and intact distal pulses.  Pulmonary/Chest: Effort normal and breath sounds normal.  Abdominal: Soft. Bowel sounds are normal.  Musculoskeletal:  Right shoulder: She exhibits no swelling, no crepitus and no spasm.  Neurological: She is alert and oriented to person, place, and time. Gait normal.  Skin: Skin is warm and dry.  Psychiatric: Memory, affect and judgment normal.   BP (!) 148/76 (BP Location: Left Arm, Patient Position: Sitting, Cuff Size: Normal) Comment: manually  Pulse 80   Temp 97.7 F (36.5 C) (Oral)   Resp 14   Ht 5\' 4"  (1.626 m)   Wt 163 lb (73.9 kg)    LMP 07/22/2017   SpO2 98%   BMI 27.98 kg/m    Plan   Essential hypertension Blood pressure is above goal on current medication regimen. I suspect that it is related to taking OTC sudafed.   - Continue medication, monitor blood pressure at home. Continue DASH diet. Reminded to go to the ER if any CP, SOB, nausea, dizziness, severe HA, changes vision/speech, left arm numbness and tingling and jaw pain. - Basic Metabolic Panel - amLODipine (NORVASC) 5 MG tablet; Take 1 tablet (5 mg total) by mouth daily.  Dispense: 30 tablet; Refill: 5  2. Generalized anxiety disorder GAD 7 : Generalized Anxiety Score 08/02/2017 05/01/2017  Nervous, Anxious, on Edge 2 3  Control/stop worrying 2 2  Worry too much - different things 1 2  Trouble relaxing 2 2  Restless 3 0  Easily annoyed or irritable 2 2  Afraid - awful might happen 0 1  Total GAD 7 Score 12 12  Anxiety Difficulty - Somewhat difficult    - busPIRone (BUSPAR) 5 MG tablet; Take 1 tablet (5 mg total) by mouth 2 (two) times daily.  Dispense: 60 tablet; Refill: 5  3. Depression, unspecified depression type [facility] screen [facility] 08/02/2017 05/01/2017 05/01/2017 10/28/2016 09/26/2016  Decreased Interest 1 2 0 0 1  Down, Depressed, Hopeless 0 1 1 0 0  PHQ - 2 Score 1 3 1  0 1  Altered sleeping - 2 - - -  Tired, decreased energy - 2 - - -  Change in appetite - 0 - - -  Feeling bad or failure about yourself  - 1 - - -  Suicidal thoughts - 0 - - -  PHQ-9 Score - 8 - - -   - busPIRone (BUSPAR) 5 MG tablet; Take 1 tablet (5 mg total) by mouth 2 (two) times daily.  Dispense: 60 tablet; Refill: 5  4. Urine leukocytes Will continue to follow urine culture.  -urine culture  5. Sinus pressure Recommend that patient refrains from taking OTC sudafed due to hypertension. Will Start a trial of fluticasone - fluticasone (FLONASE) 50 MCG/ACT nasal spray; Place 2 sprays into both nostrils daily.  Dispense: 16 g; Refill: 6   RTC: 1 week for bp  check. 6 months for hypertension.    Nolon NationsLachina Moore Faron Whitelock  MSN, FNP-C Patient Care [facility] [address]  [place], [place] [address] [phone]

## 2017-08-03 ENCOUNTER — Telehealth: Payer: Self-pay

## 2017-08-03 LAB — BASIC METABOLIC PANEL
BUN / CREAT RATIO: 17 (ref 9–23)
BUN: 11 mg/dL (ref 6–24)
CO2: 19 mmol/L — ABNORMAL LOW (ref 20–29)
CREATININE: 0.66 mg/dL (ref 0.57–1.00)
Calcium: 9.7 mg/dL (ref 8.7–10.2)
Chloride: 102 mmol/L (ref 96–106)
GFR calc Af Amer: 121 mL/min/{1.73_m2} (ref >59)
GFR calc non Af Amer: 105 mL/min/{1.73_m2} (ref >59)
GLUCOSE: 135 mg/dL — AB (ref 65–99)
Potassium: 4.6 mmol/L (ref 3.5–5.2)
SODIUM: 136 mmol/L (ref 134–144)

## 2017-08-03 NOTE — Telephone Encounter (Signed)
Called and spoke with patient, advised that all labs are within normal limits and no medication changes are needed at this time. Thanks!

## 2017-08-03 NOTE — Telephone Encounter (Signed)
-----   Message from Massie MaroonLachina M Hollis, [place]NP sent at 08/03/2017  1:21 PM EDT ----- Regarding: lab results Please inform patient that all laboratory values are within a normal range.  No medication changes warranted at this time.  Please follow-up as scheduled.  Nolon NationsLachina Moore Hollis  MSN, FNP-C Patient Care [facility] [address]  [place], [place] [id number] [phone]

## 2017-08-09 ENCOUNTER — Ambulatory Visit (INDEPENDENT_AMBULATORY_CARE_PROVIDER_SITE_OTHER): Payer: Self-pay | Admitting: Family Medicine

## 2017-08-09 VITALS — BP 138/78

## 2017-08-09 DIAGNOSIS — I10 Essential (primary) hypertension: Secondary | ICD-10-CM

## 2017-08-09 NOTE — Progress Notes (Signed)
Patient came in for bp check today. Manually it was 138/78. She was advised to continue taking medication as directed and keep next scheduled follow up. Thanks!

## 2018-02-02 ENCOUNTER — Ambulatory Visit (INDEPENDENT_AMBULATORY_CARE_PROVIDER_SITE_OTHER): Payer: Self-pay | Admitting: Family Medicine

## 2018-02-02 ENCOUNTER — Encounter: Payer: Self-pay | Admitting: Family Medicine

## 2018-02-02 VITALS — BP 169/83 | HR 82 | Temp 98.3°F | Resp 16 | Ht 64.0 in | Wt 151.0 lb

## 2018-02-02 DIAGNOSIS — I10 Essential (primary) hypertension: Secondary | ICD-10-CM

## 2018-02-02 LAB — POCT URINALYSIS DIPSTICK
Bilirubin, UA: NEGATIVE
Glucose, UA: NEGATIVE
Ketones, UA: NEGATIVE
Leukocytes, UA: NEGATIVE
Nitrite, UA: NEGATIVE
Protein, UA: NEGATIVE
Spec Grav, UA: 1.025 (ref 1.010–1.025)
Urobilinogen, UA: 0.2 E.U./dL
pH, UA: 5.5 (ref 5.0–8.0)

## 2018-02-02 MED ORDER — AMLODIPINE BESYLATE 5 MG PO TABS: 5.0000 mg | ORAL_TABLET | Freq: Every day | ORAL | 3 refills | Status: DC

## 2018-02-02 NOTE — Patient Instructions (Signed)

## 2018-02-02 NOTE — Progress Notes (Signed)
[facility]  Provider: Mike GipAndre Talullah Abate, FNP   Hypertension Follow Up Visit  SUBJECTIVE:  Jessica Garrett is a 47 y.o. female who  has a past medical history of Anemia, GERD (gastroesophageal reflux disease), Headache, and Hypertension. .   New concerns: Patient presents for f/u on htn. She has been out of her medications x 1 month. Denies headache, CP or shortness of breath today. Patient states that her anxiety and depression have resolved. She was able to keep her house and is remodeling it.  Denies problems or concerns today.   Current Outpatient Medications  Medication Sig Dispense Refill  . acetaminophen (TYLENOL) 325 MG tablet Take 650 mg by mouth every 6 (six) hours as needed.    . Black Cohosh 160 MG CAPS Take by mouth.    . cetirizine (ZYRTEC) 10 MG tablet Take 1 tablet (10 mg total) by mouth daily. 30 tablet 11  . fluticasone (FLONASE) 50 MCG/ACT nasal spray Place 2 sprays into both nostrils daily. 16 g 6  . amLODipine (NORVASC) 5 MG tablet Take 1 tablet (5 mg total) by mouth daily. (Patient not taking: Reported on 02/02/2018) 30 tablet 5  . busPIRone (BUSPAR) 5 MG tablet Take 1 tablet (5 mg total) by mouth 2 (two) times daily. (Patient not taking: Reported on 02/02/2018) 60 tablet 5  . omeprazole (PRILOSEC OTC) 20 MG tablet Take 20 mg by mouth daily as needed (indigestion).     No current facility-administered medications for this visit.     No results found for this or any previous visit (from the past 2160 hour(s)).  Hypertension ROS: Review of Systems  Constitutional: Negative.   HENT: Negative.   Eyes: Negative.   Respiratory: Negative.   Cardiovascular: Negative.   Gastrointestinal: Negative.   Genitourinary: Negative.   Musculoskeletal: Negative.   Skin: Negative.   Neurological: Negative.   Psychiatric/Behavioral: Negative.      OBJECTIVE:   BP (!) 169/83 (BP Location: Right Arm, Patient Position: Sitting,  Cuff Size: Normal)   Pulse 82   Temp 98.3 F (36.8 C) (Oral)   Resp 16   Ht 5\' 4"  (1.626 m)   Wt 151 lb (68.5 kg)   LMP 12/15/2017   SpO2 98%   BMI 25.92 kg/m   Physical Exam  Constitutional: She is oriented to person, place, and time. She appears well-developed and well-nourished. No distress.  HENT:  Head: Normocephalic and atraumatic.  Mouth/Throat: Oropharynx is clear and moist. No oropharyngeal exudate.  Eyes: Pupils are equal, round, and reactive to light. Conjunctivae and EOM are normal.  Neck: Normal range of motion. Neck supple. No thyromegaly present.  Cardiovascular: Normal rate, regular rhythm, normal heart sounds and intact distal pulses.  No murmur heard. Pulmonary/Chest: Effort normal and breath sounds normal. No respiratory distress.  Musculoskeletal: Normal range of motion.  Lymphadenopathy:    She has no cervical adenopathy.  Neurological: She is alert and oriented to person, place, and time.  Skin: Skin is warm and dry.  Psychiatric: She has a normal mood and affect. Her behavior is normal. Judgment and thought content normal.  Nursing note and vitals reviewed.    ASSESSMENT/PLAN:  1. Essential hypertension Restart medication and rtc in 2 weeks for BP check with a nurse - Urinalysis Dipstick - amLODipine (NORVASC) 5 MG tablet; Take 1 tablet (5 mg total) by mouth daily.  Dispense: 90 tablet; Refill: 3 - Lipid Panel   The patient is asked to make  an attempt to improve diet and exercise patterns to aid in medical management of this problem.  Return to care as scheduled and prn. Patient verbalized understanding and agreed with plan of care.    [name]. Jessica Lam, FNP-BC Patient Care [facility] [address] [place], [place] [address] [phone]

## 2018-02-03 LAB — LIPID PANEL
Chol/HDL Ratio: 3.6 ratio (ref 0.0–4.4)
Cholesterol, Total: 200 mg/dL — ABNORMAL HIGH (ref 100–199)
HDL: 56 mg/dL (ref >39)
LDL Calculated: 130 mg/dL — ABNORMAL HIGH (ref 0–99)
Triglycerides: 69 mg/dL (ref 0–149)
VLDL Cholesterol Cal: 14 mg/dL (ref 5–40)

## 2018-02-16 ENCOUNTER — Ambulatory Visit: Payer: Self-pay

## 2018-02-16 VITALS — BP 137/74

## 2018-02-16 DIAGNOSIS — I10 Essential (primary) hypertension: Secondary | ICD-10-CM

## 2018-07-25 ENCOUNTER — Other Ambulatory Visit: Payer: Self-pay

## 2018-07-25 ENCOUNTER — Emergency Department (HOSPITAL_COMMUNITY): Payer: Self-pay

## 2018-07-25 ENCOUNTER — Emergency Department (HOSPITAL_COMMUNITY)
Admission: EM | Admit: 2018-07-25 | Discharge: 2018-07-25 | Disposition: A | Discharge status: Home / Self Care | Payer: Self-pay | Attending: Emergency Medicine | Admitting: Emergency Medicine

## 2018-07-25 DIAGNOSIS — W1830XA Fall on same level, unspecified, initial encounter: Secondary | ICD-10-CM | POA: Insufficient documentation

## 2018-07-25 DIAGNOSIS — Y929 Unspecified place or not applicable: Secondary | ICD-10-CM | POA: Insufficient documentation

## 2018-07-25 DIAGNOSIS — Z79899 Other long term (current) drug therapy: Secondary | ICD-10-CM | POA: Insufficient documentation

## 2018-07-25 DIAGNOSIS — I10 Essential (primary) hypertension: Secondary | ICD-10-CM | POA: Insufficient documentation

## 2018-07-25 DIAGNOSIS — S93401A Sprain of unspecified ligament of right ankle, initial encounter: Secondary | ICD-10-CM | POA: Insufficient documentation

## 2018-07-25 DIAGNOSIS — Y939 Activity, unspecified: Secondary | ICD-10-CM | POA: Insufficient documentation

## 2018-07-25 DIAGNOSIS — Y999 Unspecified external cause status: Secondary | ICD-10-CM | POA: Insufficient documentation

## 2018-07-25 MED ORDER — HYDROCODONE-ACETAMINOPHEN 5-325 MG PO TABS
2.0000 | ORAL_TABLET | Freq: Once | ORAL | Status: AC
  Administered 2018-07-25: 2 via ORAL
  Filled 2018-07-25: qty 2

## 2018-07-25 NOTE — ED Triage Notes (Addendum)
Pt arrived via pov from home c/o right ankle pain since last night. Pt states she stepped off of a step and rolled her ankle. Pt states she fell to ground afterward but denies additional injury. Pt is alert and oriented x4.

## 2018-07-25 NOTE — ED Provider Notes (Signed)
[facility] EMERGENCY DEPARTMENT Provider Note   CSN: [id number] Arrival date & time: 07/25/18  1517    History   Chief Complaint Chief Complaint  Patient presents with  . Ankle Pain    HPI Jessica Garrett is a 48 y.o. female who presents for evaluation of right ankle pain that began after mechanical fall that occurred last night.  Patient is unsure of how she twisted her ankle but states that since then, she has had pain to the lateral aspect and the bottom plantar surface of her foot.  Patient states she has been able to ambulate and bear weight but does report pain and difficulty doing so.  She has been taking ibuprofen with some relief.  Patient denies any numbness/weakness.      The history is provided by the patient.    Past Medical History:  Diagnosis Date  . Anemia    only with pregnancy  . GERD (gastroesophageal reflux disease)   . Headache    " I just go to bed when I have a headache"  . Hypertension     Patient Active Problem List   Diagnosis Date Noted  . Generalized anxiety disorder 05/01/2017  . Chronic right shoulder pain 09/26/2016  . Essential hypertension 05/05/2016    Past Surgical History:  Procedure Laterality Date  . CHOLECYSTECTOMY N/A 04/26/2016   Procedure: LAPAROSCOPIC CHOLECYSTECTOMY;  Surgeon: Axel Filler, MD;  Location: [facility] OR;  Service: General;  Laterality: N/A;  . TUBAL LIGATION  1992  . VAGINAL DELIVERY     x3  . WISDOM TOOTH EXTRACTION     only on the R side , pt. reports that she was a wake for the procedure      OB History   No obstetric history on file.      Home Medications    Prior to Admission medications   Medication Sig Start Date End Date Taking? Authorizing Provider  amLODipine (NORVASC) 5 MG tablet Take 1 tablet (5 mg total) by mouth daily. 02/02/18  Yes Mike Gip, FNP  ibuprofen (ADVIL,MOTRIN) 200 MG tablet Take 400 mg by mouth every 6 (six) hours as needed for mild pain.   Yes [provider]  cetirizine (ZYRTEC) 10 MG tablet Take 1 tablet (10 mg total) by mouth daily. Patient not taking: Reported on 07/25/2018 07/20/16   Massie Maroon, FNP  fluticasone [facility]) 50 MCG/ACT nasal spray Place 2 sprays into both nostrils daily. Patient not taking: Reported on 07/25/2018 08/02/17   Massie Maroon, FNP    Family History Family History  Problem Relation Age of Onset  . Heart disease Mother   . Hypertension Father   . Diabetes Brother   . Hypertension Brother   . Diabetes Maternal Grandmother     Social History Social History   Tobacco Use  . Smoking status: Never Smoker  . Smokeless tobacco: Never Used  Substance Use Topics  . Alcohol use: No    Comment: quit 03/2016  . Drug use: No     Allergies   Patient has no known allergies.   Review of Systems Review of Systems  Musculoskeletal:       Right ankle pain  Neurological: Negative for weakness and numbness.  All other systems reviewed and are negative.    Physical Exam Updated Vital Signs BP (!) 160/78   Pulse 80   Temp 98.5 F (36.9 C) (Oral)   Resp 16   LMP 06/17/2018 Comment: irregular periods - shielded  SpO2 97%   Physical Exam Vitals signs and nursing note reviewed.  Constitutional:      Appearance: She is well-developed.  HENT:     Head: Normocephalic and atraumatic.  Eyes:     General: No scleral icterus.       Right eye: No discharge.        Left eye: No discharge.     Conjunctiva/sclera: Conjunctivae normal.  Neck:     Musculoskeletal: Normal range of motion.  Cardiovascular:     Pulses:          Dorsalis pedis pulses are 2+ on the right side and 2+ on the left side.  Pulmonary:     Effort: Pulmonary effort is normal.  Musculoskeletal:     Comments: Tenderness to palpation to the lateral aspect of the right ankle and plantar surface. No overlying ecchymosis, warmth, or erythema. No deformity or crepitus noted. No deficit noted with palpation of the achilles  tendon. Plantar flexion/dorsiflexion intact.   Skin:    General: Skin is warm and dry.     Capillary Refill: Capillary refill takes less than 2 seconds.     Comments: The skin is intact to ankle/foot.  The foot is warm and well perfused with intact sensation  Neurological:     Mental Status: She is alert.     Comments: Sensation intact throughout all major nerve distributions of the feet   Psychiatric:        Speech: Speech normal.        Behavior: Behavior normal.      ED Treatments / Results  Labs (all labs ordered are listed, but only abnormal results are displayed) Labs Reviewed - No data to display  EKG None  Radiology Dg Ankle Complete Right  Result Date: 07/25/2018 CLINICAL DATA:  Twisted ankle last night EXAM: RIGHT ANKLE - COMPLETE 3+ VIEW COMPARISON:  None. FINDINGS: There is no evidence of fracture, dislocation, or joint effusion. There is no evidence of arthropathy or other focal bone abnormality. Soft tissues are unremarkable. IMPRESSION: Negative. Electronically Signed   By: Marlan Palau M.D.   On: 07/25/2018 17:09    Procedures Procedures (including critical care time)  Medications Ordered in ED Medications  HYDROcodone-acetaminophen (NORCO/VICODIN) 5-325 MG per tablet 2 tablet (2 tablets Oral Given 07/25/18 1705)     Initial Impression / Assessment and Plan / ED Course  I have reviewed the triage vital signs and the nursing notes.  Pertinent labs & imaging results that were available during my care of the patient were reviewed by me and considered in my medical decision making (see chart for details).        48 y.o. F Presents with right ankle pain Consistent with an ankle sprain/strain after mechanical fall.  Vital signs reviewed and stable. Patient is neurovascularly intact. Consider sprain vs fracture vs dislocation.  XRs ordered.   XR reviewed. Negative for any acute fracture or dislocation. Explained results to patient and discussed that there  could be a ligamentous or muscular injury that cannot be picked up on XR. Plan to send patient home with a ASO splint and crutches. At this time, patient exhibits no emergent life-threatening condition that require further evaluation in ED or admission. Patient had ample opportunity for questions and discussion. All patient's questions were answered with full understanding. Strict return precautions discussed. Patient expresses understanding and agreement to plan.   Portions of this note were generated with Scientist, clinical (histocompatibility and immunogenetics). Dictation errors may occur despite best  attempts at proofreading.   Final Clinical Impressions(s) / ED Diagnoses   Final diagnoses:  Sprain of right ankle, unspecified ligament, initial encounter    ED Discharge Orders    None       Rosana Hoes 07/25/18 1836    Sabas Sous, MD 07/28/18 [phone]

## 2018-07-25 NOTE — Discharge Instructions (Signed)
Follow up with your Primary Care Doctor as needed.   You can take Tylenol or Ibuprofen as directed for pain. You can alternate Tylenol and Ibuprofen every 4 hours. If you take Tylenol at 1pm, then you can take Ibuprofen at 5pm. Then you can take Tylenol again at 9pm. Do not exceed 4000 mg of tylenol a day. Do not exceed 800 mg of ibuprofen a day.     Return to the Emergency Department immediately for any worsening pain, redness/swelling of the ankle, gray or blue color to the toes, numbness/weakness of toes or foot, difficulty walking or any other worsening or concerning symptoms.    Ankle sprain Ankle sprain occurs when the ligaments that hold the ankle joint to get her are stretched or torn. It may take 4-6 weeks to heal.  For activity: Use crutches with nonweightbearing for the first few days. Then, you may walk on your ankles as the pain allows, or as instructed. Start gradually with weight bearing on the affected ankle. Once you can walk pain free, then try jogging. When you can run forwards, then you can try moving side to side. If you cannot walk without crutches in one week, you need a recheck by your Family Doctor.  If you do not have a family doctor to followup with, you can see the list of phone numbers below. Please call today to make a followup appointment.   RICE therapy:  Routine Care for injuries  Rest, Ice, Compression, Elevation (RICE)  Rest is needed to allow your body to heal. Routine activities can be resumed when comfortable. Injury tendons and bones can take up to 6 weeks to heal. Tendons are cordlike structures that attach muscles and bones.  Ice following an injury helps keep the swelling down and reduce the pain. Put ice in a plastic bag. Place a towel between your skin and the bag of ice. Leave the ice on for 15-20 minutes, 3-4 times a day. Do this while awake, for the first 24-48 hours. After that continue as directed by your caregiver.  Compression helps keep  swelling down. It also gives support and helps with discomfort. If any lasting bandage has been applied, it should be removed and reapplied every 3-4 hours. It should not be applied tightly, but firmly enough to keep swelling down. Watch fingers or toes for swelling, discoloration, coldness, numbness or excessive pain. If any of these problems occur, removed the bandage and reapply loosely. Contact your caregiver if these problems continue.  Elevation helps reduce swelling and decrease your pain. With extremities such as the arms, hands, legs and feet, the injured area should be placed near or above the level of the heart if possible.   

## 2018-08-03 ENCOUNTER — Ambulatory Visit (INDEPENDENT_AMBULATORY_CARE_PROVIDER_SITE_OTHER): Payer: Self-pay | Admitting: Family Medicine

## 2018-08-03 ENCOUNTER — Encounter: Payer: Self-pay | Admitting: Family Medicine

## 2018-08-03 ENCOUNTER — Other Ambulatory Visit: Payer: Self-pay

## 2018-08-03 VITALS — BP 129/71 | HR 65 | Temp 98.2°F | Resp 16 | Ht 64.5 in | Wt 158.6 lb

## 2018-08-03 DIAGNOSIS — S96911A Strain of unspecified muscle and tendon at ankle and foot level, right foot, initial encounter: Secondary | ICD-10-CM

## 2018-08-03 DIAGNOSIS — S96911D Strain of unspecified muscle and tendon at ankle and foot level, right foot, subsequent encounter: Secondary | ICD-10-CM

## 2018-08-03 DIAGNOSIS — I10 Essential (primary) hypertension: Secondary | ICD-10-CM

## 2018-08-03 LAB — POCT URINALYSIS DIP (CLINITEK)
Bilirubin, UA: NEGATIVE
Glucose, UA: NEGATIVE mg/dL
Ketones, POC UA: NEGATIVE mg/dL
Leukocytes, UA: NEGATIVE
Nitrite, UA: NEGATIVE
POC PROTEIN,UA: NEGATIVE
Spec Grav, UA: 1.025 (ref 1.010–1.025)
Urobilinogen, UA: 0.2 E.U./dL
pH, UA: 6 (ref 5.0–8.0)

## 2018-08-03 NOTE — Patient Instructions (Signed)
Hand Washing Germs such as bacteria, viruses, and parasites are found everywhere. They can be in the air and water. They can also be on surfaces like food, door handles, and your skin. Every day, your hands touch germs. Many of these germs can make you and your family sick. Washing your hands is one of the best ways to lower your risk of getting and sharing germs. When should I wash my hands? You should wash your hands whenever you think they are dirty. You should also wash your hands:  Before: ? Visiting a baby or anyone with a weakened disease-fighting system (immunesystem). ? Putting in and taking out contact lenses.  After: ? Using the bathroom or helping someone else use the bathroom. ? Working or playing outside. ? Touching or taking out the garbage. ? Touching anything dirty around your home. ? Sneezing, coughing, or blowing your nose. ? Using a phone, including your mobile phone. ? Touching an animal, animal food, animal poop, or its toys or leash. ? Touching money. ? Using household cleaners or poisonous chemicals. ? Handling dirty clothes, bedding, or rags. ? Using public transportation. ? Going shopping, especially if you use a shopping cart or basket. ? Shaking hands. ? Handling livestock, such as cows or sheep.  Before and after: ? Preparing food. ? Eating. ? Visiting or taking care of someone who is sick. This includes touching used tissues, toys, and clothes. ? Changing a bandage (dressing). ? Taking care of an injury or wound. ? Giving or taking medicine. ? Preparing a bottle for a baby. ? Feeding a baby or young child. ? Changing a diaper. What is the right way to wash my hands?  1. Wet your hands with clean, running water. Turn off the water or move your hands out of the running water. 2. Apply liquid soap or bar soap to your hands. 3. Rub your hands together quickly to create lather. 4. Keep rubbing your hands together for at least 20 seconds. Thoroughly  scrub all parts of your hands. This includes scrubbing under your fingernails and between your fingers. 5. Rinse your hands with clean, running water. Do this until all the soap is gone. 6. Dry your hands using an air dryer or a clean paper or cloth towel, or let your hands air-dry. Do not use your clothing or a dirty towel to dry your hands. If you are in a public restroom, use your towel:  To turn off the water faucet.  To open the bathroom door. How can I clean my hands if I do not have soap and water?  If soap and clean water are not available, use a hand-washing wipe, spray, or gel (hand sanitizer). Use one that contains at least 60% alcohol. If you are handling food, gels are not recommended as a replacement for hand washing with soap and water. To use these products, follow the directions on the product, and:  Apply enough product to cover your hands.  Make sure you wipe, rub, or spray the product so that it reaches every part of your hands and wrists. Include the backs of your hands, between your fingers, and under your fingernails.  Rub the product onto your hands until it dries. Summary  Every day, your hands touch germs. Many of these germs can make you and your family sick.  Washing your hands is one of the best ways to lower your risk of getting and sharing germs.  If soap and clean water are not available,   use a hand-washing wipe, spray, or gel. This information is not intended to replace advice given to you by your health care provider. Make sure you discuss any questions you have with your health care provider. Document Released: 04/14/2008 Document Revised: 02/08/2017 Document Reviewed: 02/08/2017 Elsevier Interactive Patient Education  2019 [employer] Ankle Sprain  An ankle sprain is a stretch or tear in one of the tough tissues (ligaments) that connect the bones in your ankle. An ankle sprain can happen when the ankle rolls outward (inversion sprain) or inward  (eversion sprain). What are the causes? This condition is caused by rolling or twisting the ankle. What increases the risk? You are more likely to develop this condition if you play sports. What are the signs or symptoms? Symptoms of this condition include:  Pain in your ankle.  Swelling.  Bruising. This may happen right after you sprain your ankle or 1-2 days later.  Trouble standing or walking. How is this diagnosed? This condition is diagnosed with:  A physical exam. During the exam, your doctor will press on certain parts of your foot and ankle and try to move them in certain ways.  X-ray imaging. These may be taken to see how bad the sprain is and to check for broken bones. How is this treated? This condition may be treated with:  A brace or splint. This is used to keep the ankle from moving until it heals.  An elastic bandage. This is used to support the ankle.  Crutches.  Pain medicine.  Surgery. This may be needed if the sprain is very bad.  Physical therapy. This may help to improve movement in the ankle. Follow these instructions at home: If you have a brace or a splint:  Wear the brace or splint as told by your doctor. Remove it only as told by your doctor.  Loosen the brace or splint if your toes: ? Tingle. ? Lose feeling (become numb). ? Turn cold and blue.  Keep the brace or splint clean.  If the brace or splint is not waterproof: ? Do not let it get wet. ? Cover it with a watertight covering when you take a bath or a shower. If you have an elastic bandage (dressing):  Remove it to shower or bathe.  Try not to move your ankle much, but wiggle your toes from time to time. This helps to prevent swelling.  Adjust the dressing if it feels too tight.  Loosen the dressing if your foot: ? Loses feeling. ? Tingles. ? Becomes cold and blue. Managing pain, stiffness, and swelling   Take over-the-counter and prescription medicines only as told by  doctor.  For 2-3 days, keep your ankle raised (elevated) above the level of your heart.  If told, put ice on the injured area: ? If you have a removable brace or splint, remove it as told by your doctor. ? Put ice in a plastic bag. ? Place a towel between your skin and the bag. ? Leave the ice on for 20 minutes, 2-3 times a day. General instructions  Rest your ankle.  Do not use your injured leg to support your body weight until your doctor says that you can. Use crutches as told by your doctor.  Do not use any products that contain nicotine or tobacco, such as cigarettes, e-cigarettes, and chewing tobacco. If you need help quitting, ask your doctor.  Keep all follow-up visits as told by your doctor. Contact a doctor if:  Your bruises  or swelling are quickly getting worse.  Your pain does not get better after you take medicine. Get help right away if:  You cannot feel your toes or foot.  Your foot or toes look blue.  You have very bad pain that gets worse. Summary  An ankle sprain is a stretch or tear in one of the tough tissues (ligaments) that connect the bones in your ankle.  This condition is caused by rolling or twisting the ankle.  Symptoms include pain, swelling, bruising, and trouble walking.  To help with pain and swelling, put ice on the injured ankle, raise your ankle above the level of your heart, and use an elastic bandage. Also, rest as told by your doctor.  Keep all follow-up visits as told by your doctor. This is important. This information is not intended to replace advice given to you by your health care provider. Make sure you discuss any questions you have with your health care provider. Document Released: 10/19/2007 Document Revised: 09/26/2017 Document Reviewed: 09/26/2017 Elsevier Interactive Patient Education  2019 [employer]. Hypertension Hypertension is another name for high blood pressure. High blood pressure forces your heart to work harder  to pump blood. This can cause problems over time. There are two numbers in a blood pressure reading. There is a top number (systolic) over a bottom number (diastolic). It is best to have a blood pressure below 120/80. Healthy choices can help lower your blood pressure. You may need medicine to help lower your blood pressure if:  Your blood pressure cannot be lowered with healthy choices.  Your blood pressure is higher than 130/80. Follow these instructions at home: Eating and drinking   If directed, follow the DASH eating plan. This diet includes: ? Filling half of your plate at each meal with fruits and vegetables. ? Filling one quarter of your plate at each meal with whole grains. Whole grains include whole wheat pasta, brown rice, and whole grain bread. ? Eating or drinking low-fat dairy products, such as skim milk or low-fat yogurt. ? Filling one quarter of your plate at each meal with low-fat (lean) proteins. Low-fat proteins include fish, skinless chicken, eggs, beans, and tofu. ? Avoiding fatty meat, cured and processed meat, or chicken with skin. ? Avoiding premade or processed food.  Eat less than 1,500 mg of salt (sodium) a day.  Limit alcohol use to no more than 1 drink a day for nonpregnant women and 2 drinks a day for men. One drink equals 12 oz of beer, 5 oz of wine, or 1 oz of hard liquor. Lifestyle  Work with your doctor to stay at a healthy weight or to lose weight. Ask your doctor what the best weight is for you.  Get at least 30 minutes of exercise that causes your heart to beat faster (aerobic exercise) most days of the week. This may include walking, swimming, or biking.  Get at least 30 minutes of exercise that strengthens your muscles (resistance exercise) at least 3 days a week. This may include lifting weights or pilates.  Do not use any products that contain nicotine or tobacco. This includes cigarettes and e-cigarettes. If you need help quitting, ask your  doctor.  Check your blood pressure at home as told by your doctor.  Keep all follow-up visits as told by your doctor. This is important. Medicines  Take over-the-counter and prescription medicines only as told by your doctor. Follow directions carefully.  Do not skip doses of blood pressure medicine. The  medicine does not work as well if you skip doses. Skipping doses also puts you at risk for problems.  Ask your doctor about side effects or reactions to medicines that you should watch for. Contact a doctor if:  You think you are having a reaction to the medicine you are taking.  You have headaches that keep coming back (recurring).  You feel dizzy.  You have swelling in your ankles.  You have trouble with your vision. Get help right away if:  You get a very bad headache.  You start to feel confused.  You feel weak or numb.  You feel faint.  You get very bad pain in your: ? Chest. ? Belly (abdomen).  You throw up (vomit) more than once.  You have trouble breathing. Summary  Hypertension is another name for high blood pressure.  Making healthy choices can help lower blood pressure. If your blood pressure cannot be controlled with healthy choices, you may need to take medicine. This information is not intended to replace advice given to you by your health care provider. Make sure you discuss any questions you have with your health care provider. Document Released: 10/19/2007 Document Revised: 03/30/2016 Document Reviewed: 03/30/2016 Elsevier Interactive Patient Education  2019 [employer].

## 2018-08-03 NOTE — Progress Notes (Signed)
[facility]   Progress Note: General Provider: Mike Gip, FNP  SUBJECTIVE:   Jessica Garrett is a 48 y.o. female who  has a past medical history of Anemia, GERD (gastroesophageal reflux disease), Headache, and Hypertension.. Patient presents today for Hypertension; Insomnia; and Generalized Body Aches  Patient seen in the ED on 07/25/2018 for right ankle sprain. Given a splint and crutches.Denies loss of sensation, numbness or tingling.   She states that she is compliant with medications and denies side effects at the present time.   Review of Systems  Constitutional: Negative.   HENT: Negative.   Eyes: Negative.   Respiratory: Negative.   Cardiovascular: Negative.   Gastrointestinal: Negative.   Genitourinary: Negative.   Musculoskeletal: Positive for joint pain.  Skin: Negative.   Neurological: Negative.   Psychiatric/Behavioral: Negative.      OBJECTIVE: BP 129/71   Pulse 65   Temp 98.2 F (36.8 C)   Resp 16   Ht 5' 4.5" (1.638 m)   Wt 158 lb 9.6 oz (71.9 kg)   SpO2 99%   BMI 26.80 kg/m   Wt Readings from Last 3 Encounters:  08/03/18 158 lb 9.6 oz (71.9 kg)  02/02/18 151 lb (68.5 kg)  08/02/17 163 lb (73.9 kg)     Physical Exam Vitals signs and nursing note reviewed.  Constitutional:      General: She is not in acute distress.    Appearance: She is well-developed.  HENT:     Head: Normocephalic and atraumatic.  Eyes:     Conjunctiva/sclera: Conjunctivae normal.     Pupils: Pupils are equal, round, and reactive to light.  Neck:     Musculoskeletal: Normal range of motion and neck supple.     Vascular: No JVD.  Cardiovascular:     Rate and Rhythm: Normal rate and regular rhythm.     Heart sounds: Normal heart sounds. No murmur. No friction rub. No gallop.   Pulmonary:     Effort: Pulmonary effort is normal. No respiratory distress.     Breath sounds: Normal breath sounds. No wheezing or rales.  Chest:     Chest wall: No tenderness.  Musculoskeletal: Normal range of motion.     Right lower leg: Edema (right ankle with brace. patient ambulating with crutches. ) present.  Lymphadenopathy:     Cervical: No cervical adenopathy.  Skin:    General: Skin is warm and dry.  Neurological:     Mental Status: She is alert and oriented to person, place, and time.  Psychiatric:        Mood and Affect: Mood normal.        Behavior: Behavior normal.        Thought Content: Thought content normal.        Judgment: Judgment normal.     ASSESSMENT/PLAN:  1. Essential hypertension No medication changes warranted at the present time.   - POCT URINALYSIS DIP (CLINITEK) - Basic Metabolic Panel - Lipid Panel   2. Strain of right ankle, subsequent encounter RICE therapy Continue with crutches.   Return in about 6 months (around 02/03/2019) for HTN.    The patient was given clear instructions to go to ER or return to medical center if symptoms do not improve, worsen or new problems develop. The patient verbalized understanding and agreed with plan of care.   [name]. Jessica Lam, FNP-BC [facility] [address] [place], [place]  [address] [phone]   

## 2018-08-04 LAB — LIPID PANEL
Chol/HDL Ratio: 3.9 ratio (ref 0.0–4.4)
Cholesterol, Total: 183 mg/dL (ref 100–199)
HDL: 47 mg/dL (ref >39)
LDL Calculated: 114 mg/dL — ABNORMAL HIGH (ref 0–99)
Triglycerides: 108 mg/dL (ref 0–149)
VLDL Cholesterol Cal: 22 mg/dL (ref 5–40)

## 2018-08-04 LAB — BASIC METABOLIC PANEL
BUN/Creatinine Ratio: 17 (ref 9–23)
BUN: 11 mg/dL (ref 6–24)
CO2: 20 mmol/L (ref 20–29)
Calcium: 9.7 mg/dL (ref 8.7–10.2)
Chloride: 106 mmol/L (ref 96–106)
Creatinine, Ser: 0.63 mg/dL (ref 0.57–1.00)
GFR calc Af Amer: 122 mL/min/{1.73_m2} (ref >59)
GFR calc non Af Amer: 106 mL/min/{1.73_m2} (ref >59)
Glucose: 130 mg/dL — ABNORMAL HIGH (ref 65–99)
Potassium: 4.7 mmol/L (ref 3.5–5.2)
Sodium: 140 mmol/L (ref 134–144)

## 2018-08-16 ENCOUNTER — Encounter: Payer: Self-pay | Admitting: Family Medicine

## 2018-08-16 NOTE — Progress Notes (Signed)
Please print this lab letter and send to Virgina Aguon

## 2019-01-11 ENCOUNTER — Other Ambulatory Visit: Payer: Self-pay

## 2019-01-11 ENCOUNTER — Encounter (HOSPITAL_COMMUNITY): Payer: Self-pay | Admitting: Emergency Medicine

## 2019-01-11 ENCOUNTER — Ambulatory Visit (HOSPITAL_COMMUNITY)
Admission: EM | Admit: 2019-01-11 | Discharge: 2019-01-11 | Disposition: A | Discharge status: Home / Self Care | Payer: Self-pay | Attending: Physician Assistant | Admitting: Physician Assistant

## 2019-01-11 DIAGNOSIS — T63444A Toxic effect of venom of bees, undetermined, initial encounter: Secondary | ICD-10-CM

## 2019-01-11 DIAGNOSIS — I10 Essential (primary) hypertension: Secondary | ICD-10-CM

## 2019-01-11 DIAGNOSIS — T63441A Toxic effect of venom of bees, accidental (unintentional), initial encounter: Secondary | ICD-10-CM

## 2019-01-11 MED ORDER — DEXAMETHASONE SODIUM PHOSPHATE 10 MG/ML IJ SOLN
INTRAMUSCULAR | Status: AC
  Filled 2019-01-11: qty 1

## 2019-01-11 MED ORDER — DEXAMETHASONE SODIUM PHOSPHATE 10 MG/ML IJ SOLN
10.0000 mg | Freq: Once | INTRAMUSCULAR | Status: AC
  Administered 2019-01-11: 10 mg via INTRAMUSCULAR

## 2019-01-11 NOTE — ED Provider Notes (Signed)
[place]    CSN: [id number] Arrival date & time: 01/11/19  1415      History   Chief Complaint Chief Complaint  Patient presents with  . Insect Bite    HPI Jessica Garrett is a 48 y.o. female.   Pt reports she was stung on the back of her left leg by a dark colored bee that nest in the ground.  Pt reports she has been taking benadryl but her leg continues to be swollen   The history is provided by the patient. No language interpreter was used.  Leg Pain Location:  Leg Leg location:  L leg Pain details:    Quality:  Aching   Radiates to:  Does not radiate   Severity:  Moderate   Onset quality:  Gradual   Timing:  Constant   Progression:  Worsening   Past Medical History:  Diagnosis Date  . Anemia    only with pregnancy  . GERD (gastroesophageal reflux disease)   . Headache    " I just go to bed when I have a headache"  . Hypertension     Patient Active Problem List   Diagnosis Date Noted  . Generalized anxiety disorder 05/01/2017  . Chronic right shoulder pain 09/26/2016  . Essential hypertension 05/05/2016    Past Surgical History:  Procedure Laterality Date  . CHOLECYSTECTOMY N/A 04/26/2016   Procedure: LAPAROSCOPIC CHOLECYSTECTOMY;  Surgeon: Ralene Ok, MD;  Location: [facility];  Service: General;  Laterality: N/A;  . [place]  . VAGINAL DELIVERY     x3  . WISDOM TOOTH EXTRACTION     only on the R side , pt. reports that she was a wake for the procedure     OB History   No obstetric history on file.      Home Medications    Prior to Admission medications   Medication Sig Start Date End Date Taking? Authorizing Provider  amLODipine (NORVASC) 5 MG tablet Take 1 tablet (5 mg total) by mouth daily. 02/02/18   Lanae Boast, FNP  cetirizine (ZYRTEC) 10 MG tablet Take 1 tablet (10 mg total) by mouth daily. Patient not taking: Reported on 07/25/2018 07/20/16   Dorena Dew, FNP  fluticasone [facility]) 50 MCG/ACT nasal  spray Place 2 sprays into both nostrils daily. Patient not taking: Reported on 07/25/2018 08/02/17   Dorena Dew, FNP  ibuprofen (ADVIL,MOTRIN) 200 MG tablet Take 400 mg by mouth every 6 (six) hours as needed for mild pain.    [provider]    Family History Family History  Problem Relation Age of Onset  . Heart disease Mother   . Hypertension Father   . Diabetes Brother   . Hypertension Brother   . Diabetes Maternal Grandmother     Social History Social History   Tobacco Use  . Smoking status: Never Smoker  . Smokeless tobacco: Never Used  Substance Use Topics  . Alcohol use: No    Comment: quit 03/2016  . Drug use: No     Allergies   Patient has no known allergies.   Review of Systems Review of Systems  Skin: Negative for rash.  All other systems reviewed and are negative.    Physical Exam Triage Vital Signs ED Triage Vitals  Enc Vitals Group     BP 01/11/19 1432 (!) 145/97     Pulse Rate 01/11/19 1432 85     Resp 01/11/19 1432 18     Temp  01/11/19 1432 98.4 F (36.9 C)     Temp Source 01/11/19 1432 Oral     SpO2 01/11/19 1432 96 %     Weight --      Height --      Head Circumference --      Peak Flow --      Pain Score 01/11/19 1433 4     Pain Loc --      Pain Edu? --      Excl. in GC? --    No data found.  Updated Vital Signs BP (!) 145/97 (BP Location: Right Arm)   Pulse 85   Temp 98.4 F (36.9 C) (Oral)   Resp 18   SpO2 96%   Visual Acuity Right Eye Distance:   Left Eye Distance:   Bilateral Distance:    Right Eye Near:   Left Eye Near:    Bilateral Near:     Physical Exam Vitals signs and nursing note reviewed.  Constitutional:      Appearance: She is well-developed.  HENT:     Head: Normocephalic.  Cardiovascular:     Rate and Rhythm: Normal rate.  Pulmonary:     Effort: Pulmonary effort is normal.  Abdominal:     General: There is no distension.  Musculoskeletal: Normal range of motion.        General:  Swelling present.     Comments: Swollen posterior left leg, pain to palpation  Neurological:     Mental Status: She is alert and oriented to person, place, and time.  Psychiatric:        Mood and Affect: Mood normal.      UC Treatments / Results  Labs (all labs ordered are listed, but only abnormal results are displayed) Labs Reviewed - No data to display  EKG   Radiology No results found.  Procedures Procedures (including critical care time)  Medications Ordered in UC Medications  dexamethasone (DECADRON) injection 10 mg (10 mg Intramuscular Given 01/11/19 1454)  dexamethasone (DECADRON) 10 MG/ML injection (has no administration in time range)    Initial Impression / Assessment and Plan / UC Course  I have reviewed the triage vital signs and the nursing notes.  Pertinent labs & imaging results that were available during my care of the patient were reviewed by me and considered in my medical decision making (see chart for details).     MDM   Pt given injection of decadron.   Pt advised to continue benadryl  Final Clinical Impressions(s) / UC Diagnoses   Final diagnoses:  Bee sting, undetermined intent, initial encounter     Discharge Instructions     Return if any problems. Continue benadryl    ED Prescriptions    None     Controlled Substance Prescriptions [place] Controlled Substance Registry consulted? Not Applicable  An After Visit Summary was printed and given to the patient.    Elson AreasSofia, Dania Marsan K, [place]PA-C 01/11/19 1745

## 2019-01-11 NOTE — Discharge Instructions (Addendum)
Return if any problems.  Continue  benadryl  

## 2019-01-11 NOTE — ED Triage Notes (Signed)
Pt here for insect bite to left foot on Wednesday that is still having pain and swelling

## 2019-01-23 ENCOUNTER — Encounter (HOSPITAL_COMMUNITY): Payer: Self-pay

## 2019-01-23 ENCOUNTER — Encounter (HOSPITAL_COMMUNITY): Payer: Self-pay | Admitting: *Deleted

## 2019-02-04 ENCOUNTER — Ambulatory Visit: Payer: Self-pay | Admitting: Family Medicine

## 2019-02-12 ENCOUNTER — Ambulatory Visit (INDEPENDENT_AMBULATORY_CARE_PROVIDER_SITE_OTHER): Payer: Self-pay | Admitting: Family Medicine

## 2019-02-12 ENCOUNTER — Other Ambulatory Visit: Payer: Self-pay

## 2019-02-12 VITALS — BP 139/78 | HR 81 | Temp 99.2°F | Resp 14 | Ht 64.5 in | Wt 156.0 lb

## 2019-02-12 DIAGNOSIS — I10 Essential (primary) hypertension: Secondary | ICD-10-CM

## 2019-02-12 DIAGNOSIS — N951 Menopausal and female climacteric states: Secondary | ICD-10-CM

## 2019-02-12 DIAGNOSIS — Z2821 Immunization not carried out because of patient refusal: Secondary | ICD-10-CM

## 2019-02-12 LAB — POCT URINALYSIS DIPSTICK
Bilirubin, UA: NEGATIVE
Glucose, UA: NEGATIVE
Ketones, UA: NEGATIVE
Leukocytes, UA: NEGATIVE
Nitrite, UA: NEGATIVE
Protein, UA: NEGATIVE
Spec Grav, UA: >=1.03 — AB (ref 1.010–1.025)
Urobilinogen, UA: 0.2 E.U./dL
pH, UA: 5.5 (ref 5.0–8.0)

## 2019-02-12 MED ORDER — AMLODIPINE BESYLATE 5 MG PO TABS: 5.0000 mg | ORAL_TABLET | Freq: Every day | ORAL | 1 refills | Status: DC

## 2019-02-12 NOTE — Patient Instructions (Addendum)
Menopause is the time that marks the end of your menstrual cycles. It's diagnosed after you've gone 12 months without a menstrual period.  Menopause can happen in your 66s or 54s, but the average age is 5 in the [place]. Menopause is a natural biological process. But the physical symptoms, such as hot flashes, and emotional symptoms of menopause may disrupt your sleep, lower your energy or affect emotional health.  There are many effective treatments available, from lifestyle adjustments to hormone therapy.  Cool hot flashes. Dress in layers, have a cold glass of water or go somewhere cooler. Try to pinpoint what triggers your hot flashes. For many women, triggers may include hot beverages, caffeine, spicy foods, alcohol, stress, hot weather and even a warm room.  Decrease vaginal discomfort. Use over-the-counter, water-based vaginal lubricants (Astroglide, K-Y jelly, others), silicone-based lubricants or moisturizers (Replens, others). Choose products that don't contain glycerin, which can cause burning or irritation in women who are sensitive to that chemical. Staying sexually active also helps by increasing blood flow to the vagina.  Get enough sleep. Avoid caffeine, which can make it hard to get to sleep, and avoid drinking too much alcohol, which can interrupt sleep. Exercise during the day, although not right before bedtime. If hot flashes disturb your sleep, you may need to find a way to manage them before you can get adequate rest.  Practice relaxation techniques. Techniques such as deep breathing, paced breathing, guided imagery, massage and progressive muscle relaxation may help with menopausal sym Kegel Exercises  Kegel exercises can help strengthen your pelvic floor muscles. The pelvic floor is a group of muscles that support your rectum, small intestine, and bladder. In females, pelvic floor muscles also help support the womb (uterus). These muscles help you control the flow of  urine and stool. Kegel exercises are painless and simple, and they do not require any equipment. Your provider may suggest Kegel exercises to:  Improve bladder and bowel control.  Improve sexual response.  Improve weak pelvic floor muscles after surgery to remove the uterus (hysterectomy) or pregnancy (females).  Improve weak pelvic floor muscles after prostate gland removal or surgery (males). Kegel exercises involve squeezing your pelvic floor muscles, which are the same muscles you squeeze when you try to stop the flow of urine or keep from passing gas. The exercises can be done while sitting, standing, or lying down, but it is best to vary your position. Exercises How to do Kegel exercises: 1. Squeeze your pelvic floor muscles tight. You should feel a tight lift in your rectal area. If you are a female, you should also feel a tightness in your vaginal area. Keep your stomach, buttocks, and legs relaxed. 2. Hold the muscles tight for up to 10 seconds. 3. Breathe normally. 4. Relax your muscles. 5. Repeat as told by your health care provider. Repeat this exercise daily as told by your health care provider. Continue to do this exercise for at least 4-6 weeks, or for as long as told by your health care provider. You may be referred to a physical therapist who can help you learn more about how to do Kegel exercises. Depending on your condition, your health care provider may recommend:  Varying how long you squeeze your muscles.  Doing several sets of exercises every day.  Doing exercises for several weeks.  Making Kegel exercises a part of your regular exercise routine. This information is not intended to replace advice given to you by your health care  provider. Make sure you discuss any questions you have with your health care provider. Document Released: 04/18/2012 Document Revised: 12/20/2017 Document Reviewed: 12/20/2017 Elsevier Patient Education  2020 [employer]. You  can find a number of books, CDs and online offerings on different relaxation exercises.  Strengthen your pelvic floor. Pelvic floor muscle exercises, called Kegel exercises, can improve some forms of urinary incontinence.  Eat a balanced diet. Include a variety of fruits, vegetables and whole grains. Limit saturated fats, oils and sugars. Ask your provider if you need calcium or vitamin D supplements to help meet daily requirements.  Don't smoke. Smoking increases your risk of heart disease, stroke, osteoporosis, cancer and a range of other health problems. It may also increase hot flashes and bring on earlier menopause.  Exercise regularly. Get regular physical activity or exercise on most days to help protect against heart disease, diabetes, osteoporosis and other conditions associated with aging.

## 2019-02-13 ENCOUNTER — Telehealth: Payer: Self-pay

## 2019-02-13 ENCOUNTER — Encounter: Payer: Self-pay | Admitting: Family Medicine

## 2019-02-13 LAB — BASIC METABOLIC PANEL
BUN/Creatinine Ratio: 23 (ref 9–23)
BUN: 17 mg/dL (ref 6–24)
CO2: 19 mmol/L — ABNORMAL LOW (ref 20–29)
Calcium: 9.6 mg/dL (ref 8.7–10.2)
Chloride: 104 mmol/L (ref 96–106)
Creatinine, Ser: 0.73 mg/dL (ref 0.57–1.00)
GFR calc Af Amer: 111 mL/min/{1.73_m2} (ref >59)
GFR calc non Af Amer: 96 mL/min/{1.73_m2} (ref >59)
Glucose: 144 mg/dL — ABNORMAL HIGH (ref 65–99)
Potassium: 4.4 mmol/L (ref 3.5–5.2)
Sodium: 136 mmol/L (ref 134–144)

## 2019-02-13 NOTE — Progress Notes (Signed)
Established Patient Office Visit  Subjective:  Patient ID: Jessica Garrett, female    DOB: 1970/09/08  Age: 48 y.o. MRN: [id number]  CC:  Chief Complaint  Patient presents with  . Hypertension    HPI Jessica Garrett, a 48 year old female presents for a follow up of hypertension and to discuss menopausal symptoms.  Patient has a history of essential hypertension and has been consistently taking amlodipine for greater than 1 year. She says that blood pressure is well controlled at home. She has been following a lowfat, low sodium diet. Jessica Garrett typically exercises 4-5 times per week. .Patient denies chest pain, chest pressure/discomfort, dyspnea, irregular heart beat, lower extremity edema and palpitations.  Patient endorses a family history of hypertension.    Patient is also complaining of hot flashes and irregular menstrual cycles. She says that menses have been occurring every other month. She also complains of hot flashes throughout the day. She recently started taking Black Cohosh which helped minimally. She is sexually active and endorses some vaginal dryness. She has not attempted any over the counter lubricants to help with this problem.   Past Medical History:  Diagnosis Date  . Anemia    only with pregnancy  . GERD (gastroesophageal reflux disease)   . Headache    " I just go to bed when I have a headache"  . Hypertension     Past Surgical History:  Procedure Laterality Date  . CHOLECYSTECTOMY N/A 04/26/2016   Procedure: LAPAROSCOPIC CHOLECYSTECTOMY;  Surgeon: Ralene Ok, MD;  Location: [facility];  Service: General;  Laterality: N/A;  . [place]  . VAGINAL DELIVERY     x3  . WISDOM TOOTH EXTRACTION     only on the R side , pt. reports that she was a wake for the procedure     Family History  Problem Relation Age of Onset  . Heart disease Mother   . Hypertension Father   . Diabetes Brother   . Hypertension Brother   . Diabetes Maternal Grandmother      Social History   Socioeconomic History  . Marital status: Single    Spouse name: Not on file  . Number of children: Not on file  . Years of education: Not on file  . Highest education level: Not on file  Occupational History  . Not on file  Social Needs  . Financial resource strain: Not on file  . Food insecurity    Worry: Not on file    Inability: Not on file  . Transportation needs    Medical: Not on file    Non-medical: Not on file  Tobacco Use  . Smoking status: Never Smoker  . Smokeless tobacco: Never Used  Substance and Sexual Activity  . Alcohol use: No    Comment: quit 03/2016  . Drug use: No  . Sexual activity: Yes    Birth control/protection: None  Lifestyle  . Physical activity    Days per week: Not on file    Minutes per session: Not on file  . Stress: Not on file  Relationships  . Social Herbalist on phone: Not on file    Gets together: Not on file    Attends religious service: Not on file    Active member of club or organization: Not on file    Attends meetings of clubs or organizations: Not on file    Relationship status: Not on file  . Intimate partner violence  Fear of current or ex partner: Not on file    Emotionally abused: Not on file    Physically abused: Not on file    Forced sexual activity: Not on file  Other Topics Concern  . Not on file  Social History Narrative  . Not on file    Outpatient Medications Prior to Visit  Medication Sig Dispense Refill  . amLODipine (NORVASC) 5 MG tablet Take 1 tablet (5 mg total) by mouth daily. 90 tablet 3  . cetirizine (ZYRTEC) 10 MG tablet Take 1 tablet (10 mg total) by mouth daily. (Patient not taking: Reported on 07/25/2018) 30 tablet 11  . fluticasone (FLONASE) 50 MCG/ACT nasal spray Place 2 sprays into both nostrils daily. (Patient not taking: Reported on 07/25/2018) 16 g 6  . ibuprofen (ADVIL,MOTRIN) 200 MG tablet Take 400 mg by mouth every 6 (six) hours as needed for mild pain.      No facility-administered medications prior to visit.     No Known Allergies  ROS Review of Systems  Constitutional: Negative.   HENT: Negative.   Respiratory: Negative.   Cardiovascular: Negative.   Gastrointestinal: Negative.   Endocrine: Negative for polydipsia, polyphagia and polyuria.  Genitourinary: Negative for difficulty urinating, dyspareunia and urgency.       Vaginal dryness  Musculoskeletal: Negative.   Neurological: Negative for dizziness.       Hot flashes  Hematological: Negative.   Psychiatric/Behavioral: Negative.       Objective:    Physical Exam  Constitutional: She is oriented to person, place, and time. She appears well-developed and well-nourished.  HENT:  Head: Normocephalic.  Eyes: Pupils are equal, round, and reactive to light.  Neck: Normal range of motion.  Cardiovascular: Normal rate, regular rhythm and normal heart sounds.  Pulmonary/Chest: Effort normal and breath sounds normal.  Abdominal: Soft. Bowel sounds are normal.  Musculoskeletal: Normal range of motion.  Neurological: She is alert and oriented to person, place, and time.  Skin: Skin is warm and dry.  Psychiatric: She has a normal mood and affect. Her behavior is normal. Judgment and thought content normal.    BP 139/78 (BP Location: Right Arm, Patient Position: Sitting, Cuff Size: Normal)   Pulse 81   Temp 99.2 F (37.3 C) (Oral)   Resp 14   Ht 5' 4.5" (1.638 m)   Wt 156 lb (70.8 kg)   LMP 12/29/2018   SpO2 98%   BMI 26.36 kg/m  Wt Readings from Last 3 Encounters:  02/12/19 156 lb (70.8 kg)  08/03/18 158 lb 9.6 oz (71.9 kg)  02/02/18 151 lb (68.5 kg)     Health Maintenance Due  Topic Date Due  . MAMMOGRAM  08/06/2018  . COLONOSCOPY  08/06/2018    There are no preventive care reminders to display for this patient.  Lab Results  Component Value Date   TSH 1.500 05/01/2017   Lab Results  Component Value Date   WBC 10.2 04/20/2016   HGB 15.6 (H) 04/20/2016    HCT 46.4 (H) 04/20/2016   MCV 93.7 04/20/2016   PLT 302 04/20/2016   Lab Results  Component Value Date   NA 136 02/12/2019   K 4.4 02/12/2019   CO2 19 (L) 02/12/2019   GLUCOSE 144 (H) 02/12/2019   BUN 17 02/12/2019   CREATININE 0.73 02/12/2019   BILITOT 0.3 04/11/2016   ALKPHOS 62 04/11/2016   AST 41 (H) 04/11/2016   ALT 58 (H) 04/11/2016   PROT 7.1 04/11/2016   ALBUMIN  4.2 04/11/2016   CALCIUM 9.6 02/12/2019   ANIONGAP 9 04/20/2016   Lab Results  Component Value Date   CHOL 183 08/03/2018   Lab Results  Component Value Date   HDL 47 08/03/2018   Lab Results  Component Value Date   LDLCALC 114 (H) 08/03/2018   Lab Results  Component Value Date   TRIG 108 08/03/2018   Lab Results  Component Value Date   CHOLHDL 3.9 08/03/2018   Lab Results  Component Value Date   HGBA1C 5.8 (H) 04/11/2016      Assessment & Plan:   Problem List Items Addressed This Visit      Cardiovascular and Mediastinum   Essential hypertension - Primary   Relevant Medications   amLODipine (NORVASC) 5 MG tablet   Other Relevant Orders   Urinalysis Dipstick (Completed)   Basic Metabolic Panel (Completed)      Meds ordered this encounter  Medications  . amLODipine (NORVASC) 5 MG tablet    Sig: Take 1 tablet (5 mg total) by mouth daily.    Dispense:  90 tablet    Refill:  1   Essential hypertension - Continue medication, monitor blood pressure at home. Continue DASH diet. Reminder to go to the ER if any CP, SOB, nausea, dizziness, severe HA, changes vision/speech, left arm numbness and tingling and jaw pain. Reviewed urinalysis, no proteinuria   - Urinalysis Dipstick - amLODipine (NORVASC) 5 MG tablet; Take 1 tablet (5 mg total) by mouth daily.  Dispense: 90 tablet; Refill: 1 - Basic Metabolic Panel  Hot flushes, perimenopausal Patient and I discussed interventions. She is not interested in prescribed medications at this time. Patient given written information  pertaining to menopause   Cool hot flashes. Dress in layers, have a cold glass of water or go somewhere cooler. Try to pinpoint what triggers your hot flashes. For many women, triggers may include hot beverages, caffeine, spicy foods, alcohol, stress, hot weather and even a warm room.  Decrease vaginal discomfort. Use over-the-counter, water-based vaginal lubricants (Astroglide, K-Y jelly, others), silicone-based lubricants or moisturizers (Replens, others). Choose products that don't contain glycerin, which can cause burning or irritation in women who are sensitive to that chemical. Staying sexually active also helps by increasing blood flow to the vagina.  Get enough sleep. Avoid caffeine, which can make it hard to get to sleep, and avoid drinking too much alcohol, which can interrupt sleep. Exercise during the day, although not right before bedtime. If hot flashes disturb your sleep, you may need to find a way to manage them before you can get adequate rest.  Practice relaxation techniques. Techniques such as deep breathing, paced breathing, guided imagery, massage and progressive muscle relaxation may help with menopausal symptoms. You can find a number of books, CDs and online offerings on different relaxation exercises.  Strengthen your pelvic floor. Pelvic floor muscle exercises, called Kegel exercises, can improve some forms of urinary incontinence.  Eat a balanced diet. Include a variety of fruits, vegetables and whole grains. Limit saturated fats, oils and sugars. Ask your provider if you need calcium or vitamin D supplements to help meet daily requirements.  Don't smoke. Smoking increases your risk of heart disease, stroke, osteoporosis, cancer and a range of other health problems. It may also increase hot flashes and bring on earlier menopause.  Exercise regularly. Get regular physical activity or exercise on most days to help protect against heart disease, diabetes, osteoporosis and other  conditions associated with aging.   Health  Maintenance:  Mammogram and pap smear up to date Discussed colonoscopy Offered influenza vaccination, patient declined.   Follow-up: Return in about 6 months (around 08/12/2019) for hypertension.    Nolon NationsLachina Moore   APRN, MSN, FNP-C Patient [facility] [address]  [place], [place] [id number] [phone]

## 2019-02-13 NOTE — Telephone Encounter (Signed)
-----   Message from Dorena Dew, [place] sent at 02/13/2019  6:34 AM EDT ----- Regarding: lab results Please inform patient that all laboratory values are within normal limits. - Continue medication, monitor blood pressure at home. Continue DASH diet. Reminder to go to the ER if any CP, SOB, nausea, dizziness, severe HA, changes vision/speech, left arm numbness and tingling and jaw pain. Follow up in 6 months as scheduled.   Donia Pounds  APRN, MSN, FNP-C [facility] [address] [place], [place] [address] [phone]

## 2019-02-13 NOTE — Telephone Encounter (Signed)
Called and spoke with patient, advised that lab values are within normal limits, asked to take medications as prescribed, and continue to monitor blood pressure at home. Asked that patient continue DASH diet. Advised if she has any cp, sob, nausea, dizziness, severe HA, or changes in vision/speech to go to ER ASAP. Patient verbalized understanding. Asked to follow up in 6 months. Thanks!

## 2019-04-23 ENCOUNTER — Other Ambulatory Visit: Payer: Self-pay | Admitting: Family Medicine

## 2019-04-23 ENCOUNTER — Telehealth: Payer: Self-pay | Admitting: Internal Medicine

## 2019-04-23 DIAGNOSIS — I10 Essential (primary) hypertension: Secondary | ICD-10-CM

## 2019-04-23 MED ORDER — AMLODIPINE BESYLATE 5 MG PO TABS: 5.0000 mg | ORAL_TABLET | Freq: Every day | ORAL | 1 refills | Status: DC

## 2019-04-23 NOTE — Telephone Encounter (Signed)
done

## 2019-04-23 NOTE — Progress Notes (Signed)
Meds ordered this encounter  Medications  . amLODipine (NORVASC) 5 MG tablet    Sig: Take 1 tablet (5 mg total) by mouth daily.    Dispense:  90 tablet    Refill:  1    Order Specific Question:   Supervising Provider    Answer:   Tresa Garter [[id number]]    Donia Pounds  APRN, MSN, FNP-C [facility] [address] [place], [place] [address] [phone]

## 2019-07-29 ENCOUNTER — Telehealth: Payer: Self-pay | Admitting: Family Medicine

## 2019-07-29 NOTE — Telephone Encounter (Signed)
Pt was called and reminded of there appointment 

## 2019-07-30 ENCOUNTER — Ambulatory Visit: Payer: Self-pay | Admitting: Family Medicine

## 2019-09-10 ENCOUNTER — Other Ambulatory Visit: Payer: Self-pay

## 2019-09-10 ENCOUNTER — Ambulatory Visit (INDEPENDENT_AMBULATORY_CARE_PROVIDER_SITE_OTHER): Payer: Self-pay | Admitting: Family Medicine

## 2019-09-10 VITALS — BP 142/85 | HR 82 | Ht 64.5 in | Wt 157.0 lb

## 2019-09-10 DIAGNOSIS — I10 Essential (primary) hypertension: Secondary | ICD-10-CM

## 2019-09-10 DIAGNOSIS — M79671 Pain in right foot: Secondary | ICD-10-CM | POA: Insufficient documentation

## 2019-09-10 LAB — POCT URINALYSIS DIPSTICK
Bilirubin, UA: NEGATIVE
Glucose, UA: POSITIVE — AB
Leukocytes, UA: NEGATIVE
Nitrite, UA: NEGATIVE
Protein, UA: POSITIVE — AB
Spec Grav, UA: 1.025 (ref 1.010–1.025)
Urobilinogen, UA: 0.2 E.U./dL
pH, UA: 5.5 (ref 5.0–8.0)

## 2019-09-10 NOTE — Patient Instructions (Signed)
RICE Therapy for Routine Care of Injuries Many injuries can be cared for with rest, ice, compression, and elevation (RICE therapy). This includes:  Resting the injured part.  Putting ice on the injury.  Putting pressure (compression) on the injury.  Raising the injured part (elevation). Using RICE therapy can help to lessen pain and swelling. Supplies needed:  Ice.  Plastic bag.  Towel.  Elastic bandage.  Pillow or pillows to raise (elevate) your injured body part. How to care for your injury with RICE therapy Rest Limit your normal activities, and try not to use the injured part of your body. You can go back to your normal activities when your doctor says it is okay to do them and you feel okay. Ask your doctor if you should do exercises to help your injury get better. Ice Put ice on the injured area. Do not put ice on your bare skin.  Put ice in a plastic bag.  Place a towel between your skin and the bag.  Leave the ice on for 20 minutes, 2-3 times a day. Use ice on as many days as told by your doctor.  Compression Compression means putting pressure on the injured area. This can be done with an elastic bandage. If an elastic bandage has been put on your injury:  Do not wrap the bandage too tight. Wrap the bandage more loosely if part of your body away from the bandage is blue, swollen, cold, painful, or loses feeling (gets numb).  Take off the bandage and put it on again. Do this every 3-4 hours or as told by your doctor.  See your doctor if the bandage seems to make your problems worse.  Elevation Elevation means keeping the injured area raised. If you can, raise the injured area above your heart or the center of your chest. Contact a doctor if:  You keep having pain and swelling.  Your symptoms get worse. Get help right away if:  You have sudden bad pain at your injury or lower than your injury.  You have redness or more swelling around your injury.  You  have tingling or numbness at your injury or lower than your injury, and it does not go away when you take off the bandage. Summary  Many injuries can be cared for using rest, ice, compression, and elevation (RICE therapy).  You can go back to your normal activities when you feel okay and your doctor says it is okay.  Put ice on the injured area as told by your doctor.  Get help if your symptoms get worse or if you keep having pain and swelling. This information is not intended to replace advice given to you by your health care provider. Make sure you discuss any questions you have with your health care provider. Document Revised: 01/20/2017 Document Reviewed: 01/20/2017 Elsevier Patient Education  2020 [employer]  

## 2019-09-10 NOTE — Progress Notes (Signed)
[facility]   Established Patient Office Visit  Subjective:  Patient ID: Jessica Garrett, female    DOB: [birth year]  Age: 49 y.o. MRN: [id number]  CC:  Chief Complaint  Patient presents with  . Follow-up    swelling  foot in left great toe, could not bare weight on it last , no injury , used ice and wrap   Jessica Garrett is a very pleasant 49 year old female with a medical history significant for hypertension that presents for follow-up.  Patient says that she has been doing well.  Her primary complaint today is right foot pain and swelling that has been present for greater than 1 week.  Hypertension This is a chronic problem. The problem is controlled. Pertinent negatives include no blurred vision, chest pain, headaches, orthopnea, palpitations, peripheral edema, shortness of breath or sweats. There are no known risk factors for coronary artery disease. The current treatment provides significant improvement. There are no compliance problems.  There is no history of kidney disease or CVA.   Foot pain:  Patient is complaining of right foot pain over the past week.  She says that her foot has been slightly painful and swollen over the past week.  She does not remember sustaining an injury.  She characterizes foot is red, swollen, and tender to touch.  She says that pain has improved significantly following ice packs and Tylenol.  She is not having pain at present. Past Medical History:  Diagnosis Date  . Anemia    only with pregnancy  . GERD (gastroesophageal reflux disease)   . Headache    " I just go to bed when I have a headache"  . Hypertension     Past Surgical History:  Procedure Laterality Date  . CHOLECYSTECTOMY N/A 04/26/2016   Procedure: LAPAROSCOPIC CHOLECYSTECTOMY;  Surgeon: Axel Filler, MD;  Location: [facility];  Service: General;  Laterality: N/A;  . TUBAL LIGATION  1992  . VAGINAL DELIVERY     x3  . WISDOM TOOTH EXTRACTION      only on the R side , pt. reports that she was a wake for the procedure     Family History  Problem Relation Age of Onset  . Heart disease Mother   . Hypertension Father   . Diabetes Brother   . Hypertension Brother   . Diabetes Maternal Grandmother     Social History   Socioeconomic History  . Marital status: Single    Spouse name: Not on file  . Number of children: Not on file  . Years of education: Not on file  . Highest education level: Not on file  Occupational History  . Not on file  Tobacco Use  . Smoking status: Never Smoker  . Smokeless tobacco: Never Used  Substance and Sexual Activity  . Alcohol use: No    Comment: quit 03/2016  . Drug use: No  . Sexual activity: Yes    Birth control/protection: None  Other Topics Concern  . Not on file  Social History Narrative  . Not on file   Social Determinants of Health   Financial Resource Strain:   . Difficulty of Paying Living Expenses:   Food Insecurity:   . Worried About Programme researcher, broadcasting/film/video in the Last Year:   . Barista in the Last Year:   Transportation Needs:   . Freight forwarder (Medical):   Marland Kitchen Lack of Transportation (Non-Medical):   Physical Activity:   .  Days of Exercise per Week:   . Minutes of Exercise per Session:   Stress:   . Feeling of Stress :   Social Connections:   . Frequency of Communication with Friends and Family:   . Frequency of Social Gatherings with Friends and Family:   . Attends Religious Services:   . Active Member of Clubs or Organizations:   . Attends Archivist Meetings:   Marland Kitchen Marital Status:   Intimate Partner Violence:   . Fear of Current or Ex-Partner:   . Emotionally Abused:   Marland Kitchen Physically Abused:   . Sexually Abused:     Outpatient Medications Prior to Visit  Medication Sig Dispense Refill  . amLODipine (NORVASC) 5 MG tablet Take 1 tablet (5 mg total) by mouth daily. 90 tablet 1  . BLACK COHOSH EXTRACT PO Take by mouth. Taking 1 tablet  once a day    . ibuprofen (ADVIL,MOTRIN) 200 MG tablet Take 400 mg by mouth every 6 (six) hours as needed for mild pain.    . cetirizine (ZYRTEC) 10 MG tablet Take 1 tablet (10 mg total) by mouth daily. (Patient not taking: Reported on 07/25/2018) 30 tablet 11  . fluticasone (FLONASE) 50 MCG/ACT nasal spray Place 2 sprays into both nostrils daily. (Patient not taking: Reported on 07/25/2018) 16 g 6   No facility-administered medications prior to visit.    No Known Allergies  ROS Review of Systems  Constitutional: Negative.   HENT: Negative.   Eyes: Negative for blurred vision.  Respiratory: Negative.  Negative for shortness of breath.   Cardiovascular: Negative.  Negative for chest pain, palpitations and orthopnea.  Gastrointestinal: Negative.   Endocrine: Negative for polydipsia, polyphagia and polyuria.  Genitourinary: Negative.   Musculoskeletal: Negative.   Skin: Negative.   Neurological: Negative.  Negative for headaches.  Hematological: Negative.   Psychiatric/Behavioral: Negative.       Objective:    Physical Exam  Constitutional: She is oriented to person, place, and time. She appears well-developed and well-nourished.  HENT:  Head: Normocephalic.  Cardiovascular: Normal rate.  Pulmonary/Chest: Effort normal and breath sounds normal.  Abdominal: Soft. Bowel sounds are normal.  Musculoskeletal:     Cervical back: Normal range of motion.  Neurological: She is alert and oriented to person, place, and time.  Skin: Skin is warm.  Psychiatric: She has a normal mood and affect. Her behavior is normal. Judgment and thought content normal.    BP (!) 142/85 (BP Location: Left Arm, Patient Position: Sitting)   Pulse 82   Ht 5' 4.5" (1.638 m)   Wt 157 lb (71.2 kg)   LMP 08/19/2019   SpO2 99%   BMI 26.53 kg/m  Wt Readings from Last 3 Encounters:  09/10/19 157 lb (71.2 kg)  02/12/19 156 lb (70.8 kg)  08/03/18 158 lb 9.6 oz (71.9 kg)     Health Maintenance Due    Topic Date Due  . MAMMOGRAM  Never done  . COLONOSCOPY  Never done  . PAP SMEAR-Modifier  05/31/2019    There are no preventive care reminders to display for this patient.  Lab Results  Component Value Date   TSH 1.500 05/01/2017   Lab Results  Component Value Date   WBC 10.2 04/20/2016   HGB 15.6 (H) 04/20/2016   HCT 46.4 (H) 04/20/2016   MCV 93.7 04/20/2016   PLT 302 04/20/2016   Lab Results  Component Value Date   NA 136 02/12/2019   K 4.4 02/12/2019  CO2 19 (L) 02/12/2019   GLUCOSE 144 (H) 02/12/2019   BUN 17 02/12/2019   CREATININE 0.73 02/12/2019   BILITOT 0.3 04/11/2016   ALKPHOS 62 04/11/2016   AST 41 (H) 04/11/2016   ALT 58 (H) 04/11/2016   PROT 7.1 04/11/2016   ALBUMIN 4.2 04/11/2016   CALCIUM 9.6 02/12/2019   ANIONGAP 9 04/20/2016   Lab Results  Component Value Date   CHOL 183 08/03/2018   Lab Results  Component Value Date   HDL 47 08/03/2018   Lab Results  Component Value Date   LDLCALC 114 (H) 08/03/2018   Lab Results  Component Value Date   TRIG 108 08/03/2018   Lab Results  Component Value Date   CHOLHDL 3.9 08/03/2018   Lab Results  Component Value Date   HGBA1C 5.8 (H) 04/11/2016      Assessment & Plan:   Problem List Items Addressed This Visit      Cardiovascular and Mediastinum   Essential hypertension - Primary   Relevant Orders   POCT Urinalysis Dipstick (Completed)      Essential hypertension Blood pressure is at goal, no medication changes warranted.  Continue to follow a low-sodium, low-fat diet and exercise 3-4 times per week. - POCT Urinalysis Dipstick - Basic Metabolic Panel   Right foot pain Patient states that right foot discomfort has improved significantly.  Recommend that she continue to apply ice periodically, warm foot soaks, all as directed, and elevate while at rest.  Follow-up in clinic if foot pain worsens   Follow-up: Return in about 6 months (around 03/11/2020).    Nolon Nations   APRN, MSN, FNP-C [facility] [address] [place], [place] [address] [phone]

## 2019-09-11 LAB — BASIC METABOLIC PANEL
BUN/Creatinine Ratio: 22 (ref 9–23)
BUN: 17 mg/dL (ref 6–24)
CO2: 18 mmol/L — ABNORMAL LOW (ref 20–29)
Calcium: 10.1 mg/dL (ref 8.7–10.2)
Chloride: 99 mmol/L (ref 96–106)
Creatinine, Ser: 0.77 mg/dL (ref 0.57–1.00)
GFR calc Af Amer: 103 mL/min/{1.73_m2} (ref >59)
GFR calc non Af Amer: 90 mL/min/{1.73_m2} (ref >59)
Glucose: 288 mg/dL — ABNORMAL HIGH (ref 65–99)
Potassium: 4.4 mmol/L (ref 3.5–5.2)
Sodium: 132 mmol/L — ABNORMAL LOW (ref 134–144)

## 2019-09-12 ENCOUNTER — Telehealth: Payer: Self-pay

## 2019-09-12 NOTE — Telephone Encounter (Signed)
Called and informed patient of lab results, explain to her about the low carb diet, and low fat meals. Reminded patient of her appt.

## 2019-09-12 NOTE — Telephone Encounter (Signed)
-----   Message from Massie Maroon, [place] sent at 09/11/2019  2:53 PM EDT ----- Regarding: lab results Please inform patient that glucose level is elevated and some glucose was present in urine. Recommend a low carbohydrate, low fat diet divided over small meals throughout the day. All other lab values are within normal limits. Follow up in office as scheduled.    Nolon Nations  APRN, MSN, FNP-C Patient Care [facility] [address] [place], [place] [address] [phone]

## 2020-01-14 ENCOUNTER — Telehealth (INDEPENDENT_AMBULATORY_CARE_PROVIDER_SITE_OTHER): Payer: Self-pay | Admitting: Family Medicine

## 2020-01-14 DIAGNOSIS — H538 Other visual disturbances: Secondary | ICD-10-CM

## 2020-01-14 DIAGNOSIS — H5712 Ocular pain, left eye: Secondary | ICD-10-CM

## 2020-01-14 DIAGNOSIS — M79675 Pain in left toe(s): Secondary | ICD-10-CM

## 2020-01-14 DIAGNOSIS — M7989 Other specified soft tissue disorders: Secondary | ICD-10-CM

## 2020-01-14 NOTE — Progress Notes (Signed)
Virtual Visit via Telephone Note   Patient location: home Provider location: [facility]  I connected with Jessica Garrett on 01/14/20 at  2:55 PM EDT by telephone and verified that I am speaking with the correct person using two identifiers.   I discussed the limitations, risks, security and privacy concerns of performing an evaluation and management service by telephone and the availability of in person appointments. I also discussed with the patient that there may be a patient responsible charge related to this service. The patient expressed understanding and agreed to proceed.   History of Present Illness: Jessica Garrett is a 49 year old female with a medical history significant for essential hypertension presents via telephone with complaints of left arm redness over the past 3 days that has been unrelieved by over-the-counter eyedrops.  Patient also endorses some discomfort to left eye.  She says that it appears to be a "broken blood vessel".  Patient denies any headache, dizziness, recent falls, head injury or paresthesias.  She endorses some blurred vision. Patient is also complaining of left hip pain.  Patient says that pain is primarily to dorsal aspect of left foot.  Pain is worsened by applying shoes and with ambulation.  Patient has been taking over-the-counter ibuprofen and Tylenol as directed without any relief.  Pain intensity is 7/10 characterized as constant and throbbing.  Patient does not endorse any recent injury.  Patient works as a Scientist, research (medical) and states that foot pain has worsened with standing.  Past Medical History:  Diagnosis Date  . Anemia    only with pregnancy  . GERD (gastroesophageal reflux disease)   . Headache    " I just go to bed when I have a headache"  . Hypertension    Social History   Socioeconomic History  . Marital status: Single    Spouse name: Not on file  . Number of children: Not on file  . Years of education: Not on file  .  Highest education level: Not on file  Occupational History  . Not on file  Tobacco Use  . Smoking status: Never Smoker  . Smokeless tobacco: Never Used  Vaping Use  . Vaping Use: Never used  Substance and Sexual Activity  . Alcohol use: No    Comment: quit 03/2016  . Drug use: No  . Sexual activity: Yes    Birth control/protection: None  Other Topics Concern  . Not on file  Social History Narrative  . Not on file   Social Determinants of Health   Financial Resource Strain:   . Difficulty of Paying Living Expenses: Not on file  Food Insecurity:   . Worried About Programme researcher, broadcasting/film/video in the Last Year: Not on file  . Ran Out of Food in the Last Year: Not on file  Transportation Needs:   . Lack of Transportation (Medical): Not on file  . Lack of Transportation (Non-Medical): Not on file  Physical Activity:   . Days of Exercise per Week: Not on file  . Minutes of Exercise per Session: Not on file  Stress:   . Feeling of Stress : Not on file  Social Connections:   . Frequency of Communication with Friends and Family: Not on file  . Frequency of Social Gatherings with Friends and Family: Not on file  . Attends Religious Services: Not on file  . Active Member of Clubs or Organizations: Not on file  . Attends Banker Meetings: Not on file  .  Marital Status: Not on file  Intimate Partner Violence:   . Fear of Current or Ex-Partner: Not on file  . Emotionally Abused: Not on file  . Physically Abused: Not on file  . Sexually Abused: Not on file   Immunization History  Administered Date(s) Administered  . Janssen (J&J) SARS-COV-2 Vaccination 07/24/2019  . Tdap 04/11/2016   No Known Allergies  Assessment and Plan:  Acute left eye pain Patient advised to come in office for further workup and evaluation of left eye pain.   Blurred vision Patient warrants in person evaluation due to blurred vision  Pain and swelling of toe of left foot Elevate to heart level  while at rest.  Refrain from OTC NSAIDS. Recommend Tylenol 500 mg every 6 hours as directed.     Follow Up Instructions:    I discussed the assessment and treatment plan with the patient. The patient was provided an opportunity to ask questions and all were answered. The patient agreed with the plan and demonstrated an understanding of the instructions.   The patient was advised to call back or seek an in-person evaluation if the symptoms worsen or if the condition fails to improve as anticipated.  I provided 10 minutes of non-face-to-face time during this encounter.   Nolon Nations  APRN, MSN, FNP-C Patient Care [facility] [address] [place], [place] [address] [phone]

## 2020-01-15 ENCOUNTER — Ambulatory Visit (HOSPITAL_COMMUNITY)
Admission: RE | Admit: 2020-01-15 | Discharge: 2020-01-15 | Disposition: A | Discharge status: Home / Self Care | Payer: Self-pay | Source: Ambulatory Visit | Attending: Family Medicine | Admitting: Family Medicine

## 2020-01-15 ENCOUNTER — Other Ambulatory Visit: Payer: Self-pay

## 2020-01-15 ENCOUNTER — Encounter: Payer: Self-pay | Admitting: Family Medicine

## 2020-01-15 ENCOUNTER — Ambulatory Visit (INDEPENDENT_AMBULATORY_CARE_PROVIDER_SITE_OTHER): Payer: Self-pay | Admitting: Family Medicine

## 2020-01-15 ENCOUNTER — Other Ambulatory Visit: Payer: Self-pay | Admitting: Family Medicine

## 2020-01-15 VITALS — BP 152/86 | HR 80 | Temp 97.7°F | Ht 64.0 in | Wt 152.0 lb

## 2020-01-15 DIAGNOSIS — R2242 Localized swelling, mass and lump, left lower limb: Secondary | ICD-10-CM

## 2020-01-15 DIAGNOSIS — M79672 Pain in left foot: Secondary | ICD-10-CM | POA: Insufficient documentation

## 2020-01-15 DIAGNOSIS — E119 Type 2 diabetes mellitus without complications: Secondary | ICD-10-CM

## 2020-01-15 DIAGNOSIS — H538 Other visual disturbances: Secondary | ICD-10-CM

## 2020-01-15 DIAGNOSIS — E1140 Type 2 diabetes mellitus with diabetic neuropathy, unspecified: Secondary | ICD-10-CM

## 2020-01-15 LAB — GLUCOSE, POCT (MANUAL RESULT ENTRY): POC Glucose: 309 mg/dl — AB (ref 70–99)

## 2020-01-15 LAB — POCT GLYCOSYLATED HEMOGLOBIN (HGB A1C)
HbA1c POC (<> result, manual entry): 12.6 % (ref 4.0–5.6)
HbA1c, POC (controlled diabetic range): 12.6 % — AB (ref 0.0–7.0)
HbA1c, POC (prediabetic range): 12.6 % — AB (ref 5.7–6.4)
Hemoglobin A1C: 12.6 % — AB (ref 4.0–5.6)

## 2020-01-15 MED ORDER — TRUE METRIX GO GLUCOSE METER W/DEVICE KIT: 1.0000 | PACK | Freq: Two times a day (BID) | 0 refills | Status: DC

## 2020-01-15 MED ORDER — KETOROLAC TROMETHAMINE 30 MG/ML IJ SOLN: 30.0000 mg | Freq: Once | INTRAMUSCULAR | Status: AC

## 2020-01-15 MED ORDER — GLUCOSE BLOOD VI STRP: ORAL_STRIP | 12 refills | Status: DC

## 2020-01-15 MED ORDER — LANCETS ULTRA FINE MISC: 1.0000 | Freq: Two times a day (BID) | 6 refills | Status: DC

## 2020-01-15 MED ORDER — METFORMIN HCL 500 MG PO TABS: 500.0000 mg | ORAL_TABLET | Freq: Two times a day (BID) | ORAL | 5 refills | Status: DC

## 2020-01-15 MED FILL — !TRUE METRIX BLOOD GLUCOSE: 30 days supply | Qty: 1 | Fill #0

## 2020-01-15 MED FILL — TRUE METRIX TEST STRIP: 50 days supply | Qty: 100 | Fill #0

## 2020-01-15 MED FILL — METFORMIN HCL 500 MG TABS: 500 | 30 days supply | Qty: 60 | Fill #0

## 2020-01-15 MED FILL — TRUEplus LANCETS 28G MISC: 50 days supply | Qty: 100 | Fill #0

## 2020-01-15 NOTE — Progress Notes (Signed)
[facility]   Established Patient Office Visit  Subjective:  Patient ID: Jessica Garrett, female    DOB: 04-18-1971  Age: 49 y.o. MRN: [id number]  CC:  Chief Complaint  Patient presents with  . Follow-up    HPI Jessica Garrett is a 49 year old female with a medical history significant for essential hypertension presents complaining with blurred vision and left foot pain over the past week. Patient had a televisit on 01/14/2020 where she complained of blurred vision and eye redness for 1 week.  Problem occurred suddenly and has been happening intermittently since that time.  Patient denies any dizziness or headache.  Patient has not had an eye exam over the past several years.  She does not wear glasses or contacts.  She denies any eye injury.  She also denies photophobia.  Patient has a family history of type 2 diabetes and was diagnosed with prediabetes several years ago.  She has not been follow for prediabetes and has not been following a low fat low carbohydrate diet.  Patient denies any weight loss, weight gain, polyuria, polydipsia, or polyphagia.  Patient endorses left foot discomfort.  She characterizes pain as intermittent, shooting, and sometimes burning.  Pain is worsened by applying shoes.  Patient denies any recent injury or falls. She has taken OTC Ibuprofen and Tylenol without sustained relief.  Past Medical History:  Diagnosis Date  . Anemia    only with pregnancy  . GERD (gastroesophageal reflux disease)   . Headache    " I just go to bed when I have a headache"  . Hypertension     Past Surgical History:  Procedure Laterality Date  . CHOLECYSTECTOMY N/A 04/26/2016   Procedure: LAPAROSCOPIC CHOLECYSTECTOMY;  Surgeon: Ralene Ok, MD;  Location: [facility];  Service: General;  Laterality: N/A;  . [place]  . VAGINAL DELIVERY     x3  . WISDOM TOOTH EXTRACTION     only on the R side , pt. reports that she was a wake  for the procedure     Family History  Problem Relation Age of Onset  . Heart disease Mother   . Hypertension Father   . Diabetes Brother   . Hypertension Brother   . Diabetes Maternal Grandmother     Social History   Socioeconomic History  . Marital status: Single    Spouse name: Not on file  . Number of children: Not on file  . Years of education: Not on file  . Highest education level: Not on file  Occupational History  . Not on file  Tobacco Use  . Smoking status: Never Smoker  . Smokeless tobacco: Never Used  Vaping Use  . Vaping Use: Never used  Substance and Sexual Activity  . Alcohol use: No    Comment: quit 03/2016  . Drug use: No  . Sexual activity: Yes    Birth control/protection: None  Other Topics Concern  . Not on file  Social History Narrative  . Not on file   Social Determinants of Health   Financial Resource Strain:   . Difficulty of Paying Living Expenses: Not on file  Food Insecurity:   . Worried About Charity fundraiser in the Last Year: Not on file  . Ran Out of Food in the Last Year: Not on file  Transportation Needs:   . Lack of Transportation (Medical): Not on file  . Lack of Transportation (Non-Medical): Not on  file  Physical Activity:   . Days of Exercise per Week: Not on file  . Minutes of Exercise per Session: Not on file  Stress:   . Feeling of Stress : Not on file  Social Connections:   . Frequency of Communication with Friends and Family: Not on file  . Frequency of Social Gatherings with Friends and Family: Not on file  . Attends Religious Services: Not on file  . Active Member of Clubs or Organizations: Not on file  . Attends Archivist Meetings: Not on file  . Marital Status: Not on file  Intimate Partner Violence:   . Fear of Current or Ex-Partner: Not on file  . Emotionally Abused: Not on file  . Physically Abused: Not on file  . Sexually Abused: Not on file    Outpatient Medications Prior to Visit    Medication Sig Dispense Refill  . amLODipine (NORVASC) 5 MG tablet Take 1 tablet (5 mg total) by mouth daily. 90 tablet 1  . ibuprofen (ADVIL,MOTRIN) 200 MG tablet Take 400 mg by mouth every 6 (six) hours as needed for mild pain.    Marland Kitchen BLACK COHOSH EXTRACT PO Take by mouth. Taking 1 tablet once a day (Patient not taking: Reported on 01/15/2020)    . cetirizine (ZYRTEC) 10 MG tablet Take 1 tablet (10 mg total) by mouth daily. (Patient not taking: Reported on 07/25/2018) 30 tablet 11  . fluticasone (FLONASE) 50 MCG/ACT nasal spray Place 2 sprays into both nostrils daily. (Patient not taking: Reported on 07/25/2018) 16 g 6   No facility-administered medications prior to visit.    No Known Allergies  ROS Review of Systems  Constitutional: Negative.   HENT: Negative.   Eyes: Positive for visual disturbance.  Respiratory: Negative.   Cardiovascular: Negative.  Negative for chest pain, palpitations and leg swelling.  Gastrointestinal: Negative.   Endocrine: Negative.   Genitourinary: Negative.   Musculoskeletal: Positive for arthralgias and back pain.  Skin: Negative.   Psychiatric/Behavioral: Negative.       Objective:    Physical Exam Constitutional:      General: She is not in acute distress. HENT:     Mouth/Throat:     Mouth: Mucous membranes are moist.     Pharynx: Oropharynx is clear.  Eyes:     General: Lids are normal. Vision grossly intact. No allergic shiner, visual field deficit or scleral icterus.       Left eye: No discharge or hordeolum.     Conjunctiva/sclera:     Left eye: Left conjunctiva is not injected. No exudate or hemorrhage.    Pupils: Pupils are equal, round, and reactive to light.     Comments: No dryness No excessive tearing   Cardiovascular:     Rate and Rhythm: Normal rate and regular rhythm.     Pulses: Normal pulses.     Heart sounds: Normal heart sounds.  Pulmonary:     Effort: Pulmonary effort is normal.     Breath sounds: Normal breath sounds.   Abdominal:     General: Abdomen is flat. Bowel sounds are normal.  Neurological:     General: No focal deficit present.     Mental Status: Mental status is at baseline.  Psychiatric:        Mood and Affect: Mood normal.        Behavior: Behavior normal.        Thought Content: Thought content normal.        Judgment: Judgment  normal.     BP (!) 152/86   Pulse 80   Temp 97.7 F (36.5 C)   Ht '5\' 4"'  (1.626 m)   Wt 152 lb (68.9 kg)   SpO2 100%   BMI 26.09 kg/m  Wt Readings from Last 3 Encounters:  01/15/20 152 lb (68.9 kg)  09/10/19 157 lb (71.2 kg)  02/12/19 156 lb (70.8 kg)     Health Maintenance Due  Topic Date Due  . Hepatitis C Screening  Never done  . MAMMOGRAM  Never done  . COLONOSCOPY  Never done  . PAP SMEAR-Modifier  05/31/2019    There are no preventive care reminders to display for this patient.  Lab Results  Component Value Date   TSH 1.500 05/01/2017   Lab Results  Component Value Date   WBC 10.2 04/20/2016   HGB 15.6 (H) 04/20/2016   HCT 46.4 (H) 04/20/2016   MCV 93.7 04/20/2016   PLT 302 04/20/2016   Lab Results  Component Value Date   NA 132 (L) 09/10/2019   K 4.4 09/10/2019   CO2 18 (L) 09/10/2019   GLUCOSE 288 (H) 09/10/2019   BUN 17 09/10/2019   CREATININE 0.77 09/10/2019   BILITOT 0.3 04/11/2016   ALKPHOS 62 04/11/2016   AST 41 (H) 04/11/2016   ALT 58 (H) 04/11/2016   PROT 7.1 04/11/2016   ALBUMIN 4.2 04/11/2016   CALCIUM 10.1 09/10/2019   ANIONGAP 9 04/20/2016   Lab Results  Component Value Date   CHOL 183 08/03/2018   Lab Results  Component Value Date   HDL 47 08/03/2018   Lab Results  Component Value Date   LDLCALC 114 (H) 08/03/2018   Lab Results  Component Value Date   TRIG 108 08/03/2018   Lab Results  Component Value Date   CHOLHDL 3.9 08/03/2018   Lab Results  Component Value Date   HGBA1C 5.8 (H) 04/11/2016      Assessment & Plan:   Problem List Items Addressed This Visit    None    Visit  Diagnoses    Left eye pain    -  Primary   Blurred vision, left eye       Relevant Orders   HgB A1c   History of prediabetes       Relevant Orders   CBC   Comprehensive metabolic panel   Left foot pain       Relevant Orders   Arthritis Panel   Localized swelling of left foot       Relevant Orders   Arthritis Panel      Blurred vision, left eye - HgB A1c - Ambulatory referral to Ophthalmology   Left foot pain - Arthritis Panel - DG Foot Complete Left; Future - ketorolac (TORADOL) 30 MG/ML injection 30 mg  Localized swelling of left foot - Arthritis Panel - DG Foot Complete Left; Future - ketorolac (TORADOL) 30 MG/ML injection 30 mg  New onset type 2 diabetes mellitus (HCC) Hemoglobin a1C is 12.6, which is consistent with uncontrolled type 2 diabetes mellitus - CBC - Comprehensive metabolic panel - Ambulatory referral to diabetic education - Glucose (CBG) - metFORMIN (GLUCOPHAGE) 500 MG tablet; Take 1 tablet (500 mg total) by mouth 2 (two) times daily with a meal.  Dispense: 60 tablet; Refill: 5 - Blood Glucose Monitoring Suppl (TRUE METRIX GO GLUCOSE METER) w/Device KIT; 1 each by Does not apply route 2 (two) times daily.  Dispense: 1 kit; Refill: 0 - glucose blood test strip;  Use as instructed  Dispense: 100 each; Refill: 12 - Lancets Ultra Fine MISC; 1 each by Does not apply route 2 (two) times daily.  Dispense: 100 each; Refill: 6   Diabetic neuropathy, painful (HCC) - gabapentin (NEURONTIN) 100 MG capsule; Take 1 capsule (100 mg total) by mouth 3 (three) times daily.  Dispense: 90 capsule; Refill: 1  Follow-up: Return in about 3 months (around 04/15/2020).    Donia Pounds  APRN, MSN, FNP-C [facility] [address] [place], [place] [address] [phone]

## 2020-01-15 NOTE — Patient Instructions (Addendum)
You have new onset type 2 diabetes mellitus. Your A1C goal is less than 7. Your fasting blood sugar  Upon awakening goal is between 110-140.  Your LDL  (bad cholesterol goal is less than 100 Blood pressure goal is <140/90.  Recommend a lowfat, low carbohydrate diet divided over 5-6 small meals, increase water intake to 6-8 glasses, and 150 minutes per week of cardiovascular exercise.   Take your medications as prescribed Make sure that you are familiar with each one of your medications and what they are used to treat.  If you are unsure of medications, please bring to follow up Will send referral for eye exam  Please keep your scheduled follow up appointment.   Metformin extended-release tablets What is this medicine? METFORMIN (met FOR min) is used to treat type 2 diabetes. It helps to control blood sugar. Treatment is combined with diet and exercise. This medicine can be used alone or with other medicines for diabetes. This medicine may be used for other purposes; ask your health care provider or pharmacist if you have questions. COMMON BRAND NAME(S): Fortamet, Glucophage XR, Glumetza What should I tell my health care provider before I take this medicine? They need to know if you have any of these conditions:  anemia  dehydration  heart disease  frequently drink alcohol-containing beverages  kidney disease  liver disease  polycystic ovary syndrome  serious infection or injury  vomiting  an unusual or allergic reaction to metformin, other medicines, foods, dyes, or preservatives  pregnant or trying to get pregnant  breast-feeding How should I use this medicine? Take this medicine by mouth with a glass of water. Follow the directions on the prescription label. Take this medicine with food. Take your medicine at regular intervals. Do not take your medicine more often than directed. Do not stop taking except on your doctor's advice. Talk to your pediatrician regarding the  use of this medicine in children. Special care may be needed. Overdosage: If you think you have taken too much of this medicine contact a poison control center or emergency room at once. NOTE: This medicine is only for you. Do not share this medicine with others. What if I miss a dose? If you miss a dose, take it as soon as you can. If it is almost time for your next dose, take only that dose. Do not take double or extra doses. What may interact with this medicine? Do not take this medicine with any of the following medications:  certain contrast medicines given before X-rays, CT scans, MRI, or other procedures  dofetilide This medicine may also interact with the following medications:  acetazolamide  alcohol  certain antivirals for HIV or hepatitis  certain medicines for blood pressure, heart disease, irregular heart beat  cimetidine  dichlorphenamide  digoxin  diuretics  female hormones, like estrogens or progestins and birth control pills  glycopyrrolate  isoniazid  lamotrigine  memantine  methazolamide  metoclopramide  midodrine  niacin  phenothiazines like chlorpromazine, mesoridazine, prochlorperazine, thioridazine  phenytoin  ranolazine  steroid medicines like prednisone or cortisone  stimulant medicines for attention disorders, weight loss, or to stay awake  thyroid medicines  topiramate  trospium  vandetanib  zonisamide This list may not describe all possible interactions. Give your health care provider a list of all the medicines, herbs, non-prescription drugs, or dietary supplements you use. Also tell them if you smoke, drink alcohol, or use illegal drugs. Some items may interact with your medicine. What should I watch  for while using this medicine? Visit your doctor or health care professional for regular checks on your progress. A test called the HbA1C (A1C) will be monitored. This is a simple blood test. It measures your blood sugar  control over the last 2 to 3 months. You will receive this test every 3 to 6 months. Learn how to check your blood sugar. Learn the symptoms of low and high blood sugar and how to manage them. Always carry a quick-source of sugar with you in case you have symptoms of low blood sugar. Examples include hard sugar candy or glucose tablets. Make sure others know that you can choke if you eat or drink when you develop serious symptoms of low blood sugar, such as seizures or unconsciousness. They must get medical help at once. Tell your doctor or health care professional if you have high blood sugar. You might need to change the dose of your medicine. If you are sick or exercising more than usual, you might need to change the dose of your medicine. Do not skip meals. Ask your doctor or health care professional if you should avoid alcohol. Many nonprescription cough and cold products contain sugar or alcohol. These can affect blood sugar. This medicine may cause ovulation in premenopausal women who do not have regular monthly periods. This may increase your chances of becoming pregnant. You should not take this medicine if you become pregnant or think you may be pregnant. Talk with your doctor or health care professional about your birth control options while taking this medicine. Contact your doctor or health care professional right away if you think you are pregnant. The tablet shell for some brands of this medicine does not dissolve. This is normal. The tablet shell may appear whole in the stool. This is not a cause for concern. If you are going to need surgery, a MRI, CT scan, or other procedure, tell your doctor that you are taking this medicine. You may need to stop taking this medicine before the procedure. Wear a medical ID bracelet or chain, and carry a card that describes your disease and details of your medicine and dosage times. This medicine may cause a decrease in folic acid and vitamin B12. You  should make sure that you get enough vitamins while you are taking this medicine. Discuss the foods you eat and the vitamins you take with your health care professional. What side effects may I notice from receiving this medicine? Side effects that you should report to your doctor or health care professional as soon as possible:  allergic reactions like skin rash, itching or hives, swelling of the face, lips, or tongue  breathing problems  feeling faint or lightheaded, falls  muscle aches or pains  signs and symptoms of low blood sugar such as feeling anxious, confusion, dizziness, increased hunger, unusually weak or tired, sweating, shakiness, cold, irritable, headache, blurred vision, fast heartbeat, loss of consciousness  slow or irregular heartbeat  unusual stomach pain or discomfort  unusually tired or weak Side effects that usually do not require medical attention (report to your doctor or health care professional if they continue or are bothersome):  diarrhea  headache  heartburn  metallic taste in mouth  nausea  stomach gas, upset This list may not describe all possible side effects. Call your doctor for medical advice about side effects. You may report side effects to FDA at 1-800-FDA-1088. Where should I keep my medicine? Keep out of the reach of children. Store at room  temperature between 15 and 30 degrees C (59 and 86 degrees F). Protect from light. Throw away any unused medicine after the expiration date. NOTE: This sheet is a summary. It may not cover all possible information. If you have questions about this medicine, talk to your doctor, pharmacist, or health care provider.  2020 Elsevier/Gold Standard (2017-06-08 18:58:32)  Type 2 Diabetes Mellitus, Self Care, Adult When you have type 2 diabetes (type 2 diabetes mellitus), you must make sure your blood sugar (glucose) stays in a healthy range. You can do this with:  Nutrition.  Exercise.  Lifestyle  changes.  Medicines or insulin, if needed.  Support from your doctors and others. How to stay aware of blood sugar   Check your blood sugar level every day, as often as told.  Have your A1c (hemoglobin A1c) level checked two or more times a year. Have it checked more often if your doctor tells you to. Your doctor will set personal treatment goals for you. Generally, you should have these blood sugar levels:  Before meals (preprandial): 80-130 mg/dL (4.4-7.2 mmol/L).  After meals (postprandial): below 180 mg/dL (10 mmol/L).  A1c level: less than 7%. How to manage high and low blood sugar Signs of high blood sugar High blood sugar is called hyperglycemia. Know the signs of high blood sugar. Signs may include:  Feeling: ? Thirsty. ? Hungry. ? Very tired.  Needing to pee (urinate) more than usual.  Blurry vision. Signs of low blood sugar Low blood sugar is called hypoglycemia. This is when blood sugar is at or below 70 mg/dL (3.9 mmol/L). Signs may include:  Feeling: ? Hungry. ? Worried or nervous (anxious). ? Sweaty and clammy. ? Confused. ? Dizzy. ? Sleepy. ? Sick to your stomach (nauseous).  Having: ? A fast heartbeat. ? A headache. ? A change in your vision. ? Jerky movements that you cannot control (seizure). ? Tingling or no feeling (numbness) around your mouth, lips, or tongue.  Having trouble with: ? Moving (coordination). ? Sleeping. ? Passing out (fainting). ? Getting upset easily (irritability). Treating low blood sugar To treat low blood sugar, eat or drink something sugary right away. If you can think clearly and swallow safely, follow the 15:15 rule:  Take 15 grams of a fast-acting carb (carbohydrate). Talk with your doctor about how much you should take.  Some fast-acting carbs are: ? Sugar tablets (glucose pills). Take 3-4 pills. ? 6-8 pieces of hard candy. ? 4-6 oz (120-150 mL) of fruit juice. ? 4-6 oz (120-150 mL) of regular (not diet)  soda. ? 1 Tbsp (15 mL) honey or sugar.  Check your blood sugar 15 minutes after you take the carb.  If your blood sugar is still at or below 70 mg/dL (3.9 mmol/L), take 15 grams of a carb again.  If your blood sugar does not go above 70 mg/dL (3.9 mmol/L) after 3 tries, get help right away.  After your blood sugar goes back to normal, eat a meal or a snack within 1 hour. Treating very low blood sugar If your blood sugar is at or below 54 mg/dL (3 mmol/L), you have very low blood sugar (severe hypoglycemia). This is an emergency. Do not wait to see if the symptoms will go away. Get medical help right away. Call your local emergency services (911 in the [place] If you have very low blood sugar and you cannot eat or drink, you may need a glucagon shot (injection). A family member or friend should learn  how to check your blood sugar and how to give you a glucagon shot. Ask your doctor if you need to have a glucagon shot kit at home. Follow these instructions at home: Medicine  Take insulin and diabetes medicines as told.  If your doctor says you should take more or less insulin and medicines, do this exactly as told.  Do not run out of insulin or medicines. Having diabetes can raise your risk for other long-term conditions. These include heart disease and kidney disease. Your doctor may prescribe medicines to help you not have these problems. Food   Make healthy food choices. These include: ? Chicken, fish, egg whites, and beans. ? Oats, whole wheat, bulgur, brown rice, quinoa, and millet. ? Fresh fruits and vegetables. ? Low-fat dairy products. ? Nuts, avocado, olive oil, and canola oil.  Meet with a food specialist (dietitian). He or she can help you make an eating plan that is right for you.  Follow instructions from your doctor about what you cannot eat or drink.  Drink enough fluid to keep your pee (urine) pale yellow.  Keep track of carbs that you eat. Do this by reading food  labels and learning food serving sizes.  Follow your sick day plan when you cannot eat or drink normally. Make this plan with your doctor so it is ready to use. Activity  Exercise 3 or more times a week.  Do not go more than 2 days without exercising.  Talk with your doctor before you start a new exercise. Your doctor may need to tell you to change: ? How much insulin or medicines you take. ? How much food you eat. Lifestyle  Do not use any tobacco products. These include cigarettes, chewing tobacco, and e-cigarettes. If you need help quitting, ask your doctor.  Ask your doctor how much alcohol is safe for you.  Learn to deal with stress. If you need help with this, ask your doctor. Body care   Stay up to date with your shots (immunizations).  Have your eyes and feet checked by a doctor as often as told.  Check your skin and feet every day. Check for cuts, bruises, redness, blisters, or sores.  Brush your teeth and gums two times a day. Floss one or more times a day.  Go to the dentist one or more times every 6 months.  Stay at a healthy weight. General instructions  Take over-the-counter and prescription medicines only as told by your doctor.  Share your diabetes care plan with: ? Your work or school. ? People you live with.  Carry a card or wear jewelry that says you have diabetes.  Keep all follow-up visits as told by your doctor. This is important. Questions to ask your doctor  Do I need to meet with a diabetes educator?  Where can I find a support group for people with diabetes? Where to find more information To learn more about diabetes, visit:  American Diabetes Association: www.diabetes.org  American Association of Diabetes Educators: www.diabeteseducator.org Summary  When you have type 2 diabetes, you must make sure your blood sugar (glucose) stays in a healthy range.  Check your blood sugar every day, as often as told.  Having diabetes can raise  your risk for other conditions. Your doctor may prescribe medicines to help you not have these problems.  Keep all follow-up visits as told by your doctor. This is important. This information is not intended to replace advice given to you by your health  care provider. Make sure you discuss any questions you have with your health care provider. Document Revised: 10/23/2017 Document Reviewed: 06/05/2015 Elsevier Patient Education  [place].  Diabetes Mellitus and [place] care is an important part of your health, especially when you have diabetes. Diabetes may cause you to have problems because of poor blood flow (circulation) to your feet and legs, which can cause your skin to:  Become thinner and drier.  Break more easily.  Heal more slowly.  Peel and crack. You may also have nerve damage (neuropathy) in your legs and feet, causing decreased feeling in them. This means that you may not notice minor injuries to your feet that could lead to more serious problems. Noticing and addressing any potential problems early is the best way to prevent future foot problems. How to care for your feet Foot hygiene  Wash your feet daily with warm water and mild soap. Do not use hot water. Then, pat your feet and the areas between your toes until they are completely dry. Do not soak your feet as this can dry your skin.  Trim your toenails straight across. Do not dig under them or around the cuticle. File the edges of your nails with an emery board or nail file.  Apply a moisturizing lotion or petroleum jelly to the skin on your feet and to dry, brittle toenails. Use lotion that does not contain alcohol and is unscented. Do not apply lotion between your toes. Shoes and socks  Wear clean socks or stockings every day. Make sure they are not too tight. Do not wear knee-high stockings since they may decrease blood flow to your legs.  Wear shoes that fit properly and have enough cushioning.  Always look in your shoes before you put them on to be sure there are no objects inside.  To break in new shoes, wear them for just a few hours a day. This prevents injuries on your feet. Wounds, scrapes, corns, and calluses  Check your feet daily for blisters, cuts, bruises, sores, and redness. If you cannot see the bottom of your feet, use a mirror or ask someone for help.  Do not cut corns or calluses or try to remove them with medicine.  If you find a minor scrape, cut, or break in the skin on your feet, keep it and the skin around it clean and dry. You may clean these areas with mild soap and water. Do not clean the area with peroxide, alcohol, or iodine.  If you have a wound, scrape, corn, or callus on your foot, look at it several times a day to make sure it is healing and not infected. Check for: ? Redness, swelling, or pain. ? Fluid or blood. ? Warmth. ? Pus or a bad smell. General instructions  Do not cross your legs. This may decrease blood flow to your feet.  Do not use heating pads or hot water bottles on your feet. They may burn your skin. If you have lost feeling in your feet or legs, you may not know this is happening until it is too late.  Protect your feet from hot and cold by wearing shoes, such as at the beach or on hot pavement.  Schedule a complete foot exam at least once a year (annually) or more often if you have foot problems. If you have foot problems, report any cuts, sores, or bruises to your health care provider immediately. Contact a health care provider if:  You  have a medical condition that increases your risk of infection and you have any cuts, sores, or bruises on your feet.  You have an injury that is not healing.  You have redness on your legs or feet.  You feel burning or tingling in your legs or feet.  You have pain or cramps in your legs and feet.  Your legs or feet are numb.  Your feet always feel cold.  You have pain around a  toenail. Get help right away if:  You have a wound, scrape, corn, or callus on your foot and: ? You have pain, swelling, or redness that gets worse. ? You have fluid or blood coming from the wound, scrape, corn, or callus. ? Your wound, scrape, corn, or callus feels warm to the touch. ? You have pus or a bad smell coming from the wound, scrape, corn, or callus. ? You have a fever. ? You have a red line going up your leg. Summary  Check your feet every day for cuts, sores, red spots, swelling, and blisters.  Moisturize feet and legs daily.  Wear shoes that fit properly and have enough cushioning.  If you have foot problems, report any cuts, sores, or bruises to your health care provider immediately.  Schedule a complete foot exam at least once a year (annually) or more often if you have foot problems. This information is not intended to replace advice given to you by your health care provider. Make sure you discuss any questions you have with your health care provider. Document Revised: 01/23/2019 Document Reviewed: 06/03/2016 Elsevier Patient Education  [place].  Diabetes Basics  Diabetes (diabetes mellitus) is a long-term (chronic) disease. It occurs when the body does not properly use sugar (glucose) that is released from food after you eat. Diabetes may be caused by one or both of these problems:  Your pancreas does not make enough of a hormone called insulin.  Your body does not react in a normal way to insulin that it makes. Insulin lets sugars (glucose) go into cells in your body. This gives you energy. If you have diabetes, sugars cannot get into cells. This causes high blood sugar (hyperglycemia). Follow these instructions at home: How is diabetes treated? You may need to take insulin or other diabetes medicines daily to keep your blood sugar in balance. Take your diabetes medicines every day as told by your doctor. List your diabetes medicines here: Diabetes  medicines  Name of medicine: ______________________________ ? Amount (dose): _______________ Time (a.m./p.m.): _______________ Notes: ___________________________________  Name of medicine: ______________________________ ? Amount (dose): _______________ Time (a.m./p.m.): _______________ Notes: ___________________________________  Name of medicine: ______________________________ ? Amount (dose): _______________ Time (a.m./p.m.): _______________ Notes: ___________________________________ If you use insulin, you will learn how to give yourself insulin by injection. You may need to adjust the amount based on the food that you eat. List the types of insulin you use here: Insulin  Insulin type: ______________________________ ? Amount (dose): _______________ Time (a.m./p.m.): _______________ Notes: ___________________________________  Insulin type: ______________________________ ? Amount (dose): _______________ Time (a.m./p.m.): _______________ Notes: ___________________________________  Insulin type: ______________________________ ? Amount (dose): _______________ Time (a.m./p.m.): _______________ Notes: ___________________________________  Insulin type: ______________________________ ? Amount (dose): _______________ Time (a.m./p.m.): _______________ Notes: ___________________________________  Insulin type: ______________________________ ? Amount (dose): _______________ Time (a.m./p.m.): _______________ Notes: ___________________________________ How do I manage my blood sugar?  Check your blood sugar levels using a blood glucose monitor as directed by your doctor. Your doctor will set treatment goals for you. Generally, you should  have these blood sugar levels:  Before meals (preprandial): 80-130 mg/dL (4.4-7.2 mmol/L).  After meals (postprandial): below 180 mg/dL (10 mmol/L).  A1c level: less than 7%. Write down the times that you will check your blood sugar levels: Blood sugar  checks  Time: _______________ Notes: ___________________________________  Time: _______________ Notes: ___________________________________  Time: _______________ Notes: ___________________________________  Time: _______________ Notes: ___________________________________  Time: _______________ Notes: ___________________________________  Time: _______________ Notes: ___________________________________  What do I need to know about low blood sugar? Low blood sugar is called hypoglycemia. This is when blood sugar is at or below 70 mg/dL (3.9 mmol/L). Symptoms may include:  Feeling: ? Hungry. ? Worried or nervous (anxious). ? Sweaty and clammy. ? Confused. ? Dizzy. ? Sleepy. ? Sick to your stomach (nauseous).  Having: ? A fast heartbeat. ? A headache. ? A change in your vision. ? Tingling or no feeling (numbness) around the mouth, lips, or tongue. ? Jerky movements that you cannot control (seizure).  Having trouble with: ? Moving (coordination). ? Sleeping. ? Passing out (fainting). ? Getting upset easily (irritability). Treating low blood sugar To treat low blood sugar, eat or drink something sugary right away. If you can think clearly and swallow safely, follow the 15:15 rule:  Take 15 grams of a fast-acting carb (carbohydrate). Talk with your doctor about how much you should take.  Some fast-acting carbs are: ? Sugar tablets (glucose pills). Take 3-4 glucose pills. ? 6-8 pieces of hard candy. ? 4-6 oz (120-150 mL) of fruit juice. ? 4-6 oz (120-150 mL) of regular (not diet) soda. ? 1 Tbsp (15 mL) honey or sugar.  Check your blood sugar 15 minutes after you take the carb.  If your blood sugar is still at or below 70 mg/dL (3.9 mmol/L), take 15 grams of a carb again.  If your blood sugar does not go above 70 mg/dL (3.9 mmol/L) after 3 tries, get help right away.  After your blood sugar goes back to normal, eat a meal or a snack within 1 hour. Treating very low  blood sugar If your blood sugar is at or below 54 mg/dL (3 mmol/L), you have very low blood sugar (severe hypoglycemia). This is an emergency. Do not wait to see if the symptoms will go away. Get medical help right away. Call your local emergency services (911 in the [place] Do not drive yourself to the hospital. Questions to ask your health care provider  Do I need to meet with a diabetes educator?  What equipment will I need to care for myself at home?  What diabetes medicines do I need? When should I take them?  How often do I need to check my blood sugar?  What number can I call if I have questions?  When is my next doctor's visit?  Where can I find a support group for people with diabetes? Where to find more information  American Diabetes Association: www.diabetes.org  American Association of Diabetes Educators: www.diabeteseducator.org/patient-resources Contact a doctor if:  Your blood sugar is at or above 240 mg/dL (13.3 mmol/L) for 2 days in a row.  You have been sick or have had a fever for 2 days or more, and you are not getting better.  You have any of these problems for more than 6 hours: ? You cannot eat or drink. ? You feel sick to your stomach (nauseous). ? You throw up (vomit). ? You have watery poop (diarrhea). Get help right away if:  Your blood sugar is lower  than 54 mg/dL (3 mmol/L).  You get confused.  You have trouble: ? Thinking clearly. ? Breathing. Summary  Diabetes (diabetes mellitus) is a long-term (chronic) disease. It occurs when the body does not properly use sugar (glucose) that is released from food after digestion.  Take insulin and diabetes medicines as told.  Check your blood sugar every day, as often as told.  Keep all follow-up visits as told by your doctor. This is important. This information is not intended to replace advice given to you by your health care provider. Make sure you discuss any questions you have with your health  care provider. Document Revised: 01/23/2019 Document Reviewed: 08/04/2017 Elsevier Patient Education  [place].

## 2020-01-15 NOTE — Progress Notes (Signed)
Integrated Behavioral Health Case Management Referral Note  01/15/2020 Name: Jessica Garrett MRN: [id number] DOB: 07/27/1970 Jessica Garrett is a 49 y.o. year old female who sees Douglas, Andre, FNP for primary care. Patient was seen by Lachina Hollis, FNP today. LCSW was consulted to assess patient's needs and assist the patient with Financial Difficulties related to low income and no health coverage.  Interpreter: No.   Interpreter Name & Language: none  Assessment: Patient experiencing Financial constraints related to low income and no health coverage. Patient referred for resources due to lack of insurance coverage. Patient works and reports she may have had coverage through the Marketplace last year, but she is not sure. She has had an Orange Card in the past. Attempted to log in to healthcare.gov with patient to view her coverage, but was unsuccessful. Patient plans to look for documents related to possible coverage that she received in the mail.   Reviewed Orange Card and CAFA applications with patient and reviewed required supporting documents. Patient to call CSW when application complete to review and CSW will support with scheduling appointment with financial counselor at [facility].   PCP returned during CSW visit and updated patient on her medical condition. Patient somewhat tearful. CSW provided brief support. Provided CSW contact information and advised to call for additional support if needed.  Review of patient status, including review of consultants reports, relevant laboratory and other test results, and collaboration with appropriate care team members and the patient's provider was performed as part of comprehensive patient evaluation and provision of services.    SDOH (Social Determinants of Health) assessments performed: No    Outpatient Encounter Medications as of 01/15/2020  Medication Sig  . amLODipine (NORVASC) 5 MG tablet Take 1 tablet (5 mg total) by mouth daily.  . ibuprofen  (ADVIL,MOTRIN) 200 MG tablet Take 400 mg by mouth every 6 (six) hours as needed for mild pain.  . BLACK COHOSH EXTRACT PO Take by mouth. Taking 1 tablet once a day (Patient not taking: Reported on 01/15/2020)  . Blood Glucose Monitoring Suppl (TRUE METRIX GO GLUCOSE METER) w/Device KIT 1 each by Does not apply route 2 (two) times daily.  . cetirizine (ZYRTEC) 10 MG tablet Take 1 tablet (10 mg total) by mouth daily. (Patient not taking: Reported on 07/25/2018)  . fluticasone (FLONASE) 50 MCG/ACT nasal spray Place 2 sprays into both nostrils daily. (Patient not taking: Reported on 07/25/2018)  . glucose blood test strip Use as instructed  . Lancets Ultra Fine MISC 1 each by Does not apply route 2 (two) times daily.  . metFORMIN (GLUCOPHAGE) 500 MG tablet Take 1 tablet (500 mg total) by mouth 2 (two) times daily with a meal.   Facility-Administered Encounter Medications as of 01/15/2020  Medication  . ketorolac (TORADOL) 30 MG/ML injection 30 mg    Goals Addressed   None      Follow up Plan: 1. Patient to complete application and follow up with CSW  2. CSW to assist in scheduling financial counseling appointment and will be available from clinic as needed.  Susan Porter, LCSW [facility] [phone]    

## 2020-01-16 ENCOUNTER — Telehealth: Payer: Self-pay | Admitting: Clinical

## 2020-01-16 ENCOUNTER — Telehealth: Payer: Self-pay | Admitting: Family Medicine

## 2020-01-16 LAB — CBC
Hematocrit: 48.5 % — ABNORMAL HIGH (ref 34.0–46.6)
Hemoglobin: 15.6 g/dL (ref 11.1–15.9)
MCH: 30.9 pg (ref 26.6–33.0)
MCHC: 32.2 g/dL (ref 31.5–35.7)
MCV: 96 fL (ref 79–97)
Platelets: 266 10*3/uL (ref 150–450)
RBC: 5.05 x10E6/uL (ref 3.77–5.28)
RDW: 12.2 % (ref 11.7–15.4)
WBC: 7.2 10*3/uL (ref 3.4–10.8)

## 2020-01-16 LAB — COMPREHENSIVE METABOLIC PANEL
ALT: 20 IU/L (ref 0–32)
AST: 11 IU/L (ref 0–40)
Albumin/Globulin Ratio: 1.3 (ref 1.2–2.2)
Albumin: 4 g/dL (ref 3.8–4.9)
Alkaline Phosphatase: 118 IU/L (ref 48–121)
BUN/Creatinine Ratio: 18 (ref 9–23)
BUN: 13 mg/dL (ref 6–24)
Bilirubin Total: 0.3 mg/dL (ref 0.0–1.2)
CO2: 21 mmol/L (ref 20–29)
Calcium: 9.8 mg/dL (ref 8.7–10.2)
Chloride: 102 mmol/L (ref 96–106)
Creatinine, Ser: 0.73 mg/dL (ref 0.57–1.00)
GFR calc Af Amer: 110 mL/min/{1.73_m2} (ref >59)
GFR calc non Af Amer: 96 mL/min/{1.73_m2} (ref >59)
Globulin, Total: 3.1 g/dL (ref 1.5–4.5)
Glucose: 299 mg/dL — ABNORMAL HIGH (ref 65–99)
Potassium: 4.5 mmol/L (ref 3.5–5.2)
Sodium: 136 mmol/L (ref 134–144)
Total Protein: 7.1 g/dL (ref 6.0–8.5)

## 2020-01-16 LAB — ARTHRITIS PANEL
Anti Nuclear Antibody (ANA): POSITIVE — AB
Rheumatoid fact SerPl-aCnc: <10 IU/mL (ref 0.0–13.9)
Sed Rate: 25 mm/hr (ref 0–40)
Uric Acid: 3 mg/dL (ref 3.0–7.2)

## 2020-01-16 MED ORDER — GABAPENTIN 100 MG PO CAPS: 100.0000 mg | ORAL_CAPSULE | Freq: Three times a day (TID) | ORAL | 1 refills | Status: DC

## 2020-01-16 NOTE — Telephone Encounter (Signed)
Integrated Behavioral Health General Follow Up Note  01/16/2020 Name: Jessica Garrett MRN: [id number] DOB: 1971-03-01 Jessica Garrett is a 49 y.o. year old female who sees Mike Gip, FNP for primary care. LCSW was initially consulted to assess patient's needs and assist the patient with financial difficulties related to low income and no health coverage.  Interpreter: No.   Interpreter Name & Language: none  Ongoing Intervention: Patient experiencing financial constraints related to low income. Patient also requires eye exam due to vision problems, but was unsure if she had insurance to cover specialist visits. Today patient called CSW and reported that she was able to confirm that she does have health insurance through the Marketplace. Her insurance company is sending a new insurance card. Referred to PCP, [place] Hollis, who has now made referral to ophthalmology. Called patient back and advised of this. Advised patient to bring her insurance information to next PCP visit as well.   Review of patient status, including review of consultants reports, relevant laboratory and other test results, and collaboration with appropriate care team members and the patient's provider was performed as part of comprehensive patient evaluation and provision of services.     Follow up Plan: 1. CSW available from clinic as needed   [name], LCSW [facility] [phone]

## 2020-01-23 NOTE — Telephone Encounter (Signed)
Sent to NP 

## 2020-02-07 ENCOUNTER — Telehealth: Payer: Self-pay

## 2020-02-07 NOTE — Telephone Encounter (Signed)
[facility] for referral on 02-06-2020 also on 02-07-2020.   Left message to call patient for appointment

## 2020-02-18 ENCOUNTER — Ambulatory Visit: Payer: Self-pay

## 2020-02-18 MED FILL — METFORMIN HCL 500 MG TABS: 500 | 30 days supply | Qty: 60 | Fill #1

## 2020-02-25 ENCOUNTER — Ambulatory Visit: Payer: Self-pay

## 2020-03-09 MED FILL — TRUEplus LANCETS 28G MISC: 50 days supply | Qty: 100 | Fill #1

## 2020-03-09 MED FILL — TRUE METRIX TEST STRIP: 50 days supply | Qty: 100 | Fill #1

## 2020-03-10 ENCOUNTER — Ambulatory Visit: Payer: Self-pay | Admitting: Family Medicine

## 2020-03-25 MED FILL — METFORMIN HCL 500 MG TABS: 500 | 30 days supply | Qty: 60 | Fill #2

## 2020-04-21 ENCOUNTER — Encounter: Payer: Self-pay | Admitting: Family Medicine

## 2020-04-21 ENCOUNTER — Ambulatory Visit (INDEPENDENT_AMBULATORY_CARE_PROVIDER_SITE_OTHER): Payer: Self-pay | Admitting: Family Medicine

## 2020-04-21 ENCOUNTER — Other Ambulatory Visit: Payer: Self-pay

## 2020-04-21 VITALS — BP 151/87 | HR 76 | Temp 97.6°F | Resp 18 | Ht 64.5 in | Wt 153.2 lb

## 2020-04-21 DIAGNOSIS — E119 Type 2 diabetes mellitus without complications: Secondary | ICD-10-CM

## 2020-04-21 DIAGNOSIS — F341 Dysthymic disorder: Secondary | ICD-10-CM

## 2020-04-21 DIAGNOSIS — R8290 Unspecified abnormal findings in urine: Secondary | ICD-10-CM

## 2020-04-21 LAB — POCT URINALYSIS DIP (CLINITEK)
Bilirubin, UA: NEGATIVE
Glucose, UA: NEGATIVE mg/dL
Ketones, POC UA: NEGATIVE mg/dL
Nitrite, UA: NEGATIVE
POC PROTEIN,UA: 30 — AB
Spec Grav, UA: >=1.03 — AB
Urobilinogen, UA: 0.2 U/dL
pH, UA: 5.5

## 2020-04-21 LAB — POCT GLYCOSYLATED HEMOGLOBIN (HGB A1C): Hemoglobin A1C: 7.7 % — AB (ref 4.0–5.6)

## 2020-04-21 LAB — POCT CBG (FASTING - GLUCOSE)-MANUAL ENTRY: Glucose Fasting, POC: 197 mg/dL — AB (ref 70–99)

## 2020-04-21 MED ORDER — SERTRALINE HCL 50 MG PO TABS: 50.0000 mg | ORAL_TABLET | Freq: Every day | ORAL | 3 refills | Status: DC

## 2020-04-21 NOTE — Progress Notes (Signed)
[facility]   Established Patient Office Visit  Subjective:  Patient ID: Jessica Garrett, female    DOB: [birth year]  Age: 49 y.o. MRN: [id number]  CC:  Chief Complaint  Patient presents with  . Follow-up   Jessica Garrett is a 49 year old female with a medical history significant for essential hypertension and new onset type 2 diabetes mellitus presents for follow-up of chronic conditions.  3 months prior, patient was initially diagnosed with type 2 diabetes mellitus.  At that time, hemoglobin A1c was 12.6.  Patient has a family history of type 2 diabetes.  Patient's mother and brother are both insulin-dependent.  Patient was started on Metformin 1000 mg daily.  She says that she has been taking medications consistently and without interruption.  She has been following a carbohydrate modified diet divided over small meals throughout the day.  Patient also has increased daily physical activity and water intake.  She has been maintaining blood sugar diary.  She states that she has elevated blood sugars fasting in AM.  Blood sugars often greater than 170.  Patient denies blurred vision, dizziness, polyuria, polydipsia, or polyphagia. Patient has a history of essential hypertension.  Blood pressure has been controlled on current medication regimen. Patient does not check blood pressure at home.  Patient denies any shortness of breath, chest pain, orthopnea or lower extremity swelling.  Patient endorses some anxiety.  Anxiety is mostly related to new diagnosis of diabetes patient states that it has been difficult to plan meals and check blood sugars throughout the day.  She reports anhedonia.  She also reports some excessive worrying.  She denies any suicidal or homicidal ideations.   Past Medical History:  Diagnosis Date  . Anemia    only with pregnancy  . GERD (gastroesophageal reflux disease)   . Headache    " I just go to bed when I have a headache"   . Hypertension     Past Surgical History:  Procedure Laterality Date  . CHOLECYSTECTOMY N/A 04/26/2016   Procedure: LAPAROSCOPIC CHOLECYSTECTOMY;  Surgeon: Ralene Ok, MD;  Location: [facility];  Service: General;  Laterality: N/A;  . [place]  . VAGINAL DELIVERY     x3  . WISDOM TOOTH EXTRACTION     only on the R side , pt. reports that she was a wake for the procedure     Family History  Problem Relation Age of Onset  . Heart disease Mother   . Hypertension Father   . Diabetes Brother   . Hypertension Brother   . Diabetes Maternal Grandmother     Social History   Socioeconomic History  . Marital status: Single    Spouse name: Not on file  . Number of children: Not on file  . Years of education: Not on file  . Highest education level: Not on file  Occupational History  . Not on file  Tobacco Use  . Smoking status: Never Smoker  . Smokeless tobacco: Never Used  Vaping Use  . Vaping Use: Never used  Substance and Sexual Activity  . Alcohol use: No    Comment: quit 03/2016  . Drug use: No  . Sexual activity: Yes    Birth control/protection: None  Other Topics Concern  . Not on file  Social History Narrative  . Not on file   Social Determinants of Health   Financial Resource Strain:   . Difficulty of Paying Living Expenses: Not on file  Food Insecurity:   . Worried About Charity fundraiser in the Last Year: Not on file  . Ran Out of Food in the Last Year: Not on file  Transportation Needs:   . Lack of Transportation (Medical): Not on file  . Lack of Transportation (Non-Medical): Not on file  Physical Activity:   . Days of Exercise per Week: Not on file  . Minutes of Exercise per Session: Not on file  Stress:   . Feeling of Stress : Not on file  Social Connections:   . Frequency of Communication with Friends and Family: Not on file  . Frequency of Social Gatherings with Friends and Family: Not on file  . Attends Religious Services: Not on  file  . Active Member of Clubs or Organizations: Not on file  . Attends Archivist Meetings: Not on file  . Marital Status: Not on file  Intimate Partner Violence:   . Fear of Current or Ex-Partner: Not on file  . Emotionally Abused: Not on file  . Physically Abused: Not on file  . Sexually Abused: Not on file    Outpatient Medications Prior to Visit  Medication Sig Dispense Refill  . amLODipine (NORVASC) 5 MG tablet Take 1 tablet (5 mg total) by mouth daily. 90 tablet 1  . Blood Glucose Monitoring Suppl (TRUE METRIX GO GLUCOSE METER) w/Device KIT 1 each by Does not apply route 2 (two) times daily. 1 kit 0  . cetirizine (ZYRTEC) 10 MG tablet Take 1 tablet (10 mg total) by mouth daily. 30 tablet 11  . fluticasone (FLONASE) 50 MCG/ACT nasal spray Place 2 sprays into both nostrils daily. 16 g 6  . gabapentin (NEURONTIN) 100 MG capsule Take 1 capsule (100 mg total) by mouth 3 (three) times daily. 90 capsule 1  . glucose blood test strip Use as instructed 100 each 12  . ibuprofen (ADVIL,MOTRIN) 200 MG tablet Take 400 mg by mouth every 6 (six) hours as needed for mild pain.    . Lancets Ultra Fine MISC 1 each by Does not apply route 2 (two) times daily. 100 each 6  . metFORMIN (GLUCOPHAGE) 500 MG tablet Take 1 tablet (500 mg total) by mouth 2 (two) times daily with a meal. 60 tablet 5  . BLACK COHOSH EXTRACT PO Take by mouth. Taking 1 tablet once a day (Patient not taking: Reported on 01/15/2020)     No facility-administered medications prior to visit.    No Known Allergies  ROS Review of Systems  Constitutional: Negative.  Negative for activity change and appetite change.  HENT: Negative.   Eyes: Negative.  Negative for photophobia and visual disturbance.  Respiratory: Negative.   Cardiovascular: Negative.   Gastrointestinal: Negative.   Endocrine: Negative.   Genitourinary: Negative.   Musculoskeletal: Negative.   Neurological: Negative.   Psychiatric/Behavioral: Positive  for dysphoric mood. The patient is nervous/anxious.       Objective:    Physical Exam Constitutional:      Appearance: Normal appearance.  HENT:     Head: Normocephalic.  Eyes:     Pupils: Pupils are equal, round, and reactive to light.  Cardiovascular:     Rate and Rhythm: Normal rate and regular rhythm.  Pulmonary:     Effort: Pulmonary effort is normal.     Breath sounds: Normal breath sounds.  Abdominal:     General: Bowel sounds are normal.     Palpations: Abdomen is soft.  Skin:    General: Skin is  warm.  Neurological:     Mental Status: She is alert.  Psychiatric:        Mood and Affect: Mood normal.        Behavior: Behavior normal.        Thought Content: Thought content normal.     BP (!) 151/87 (BP Location: Left Arm, Patient Position: Sitting, Cuff Size: Normal)   Pulse 76   Temp 97.6 F (36.4 C) (Oral)   Resp 18   Ht 5' 4.5" (1.638 m)   Wt 153 lb 3.2 oz (69.5 kg)   SpO2 99%   BMI 25.89 kg/m  Wt Readings from Last 3 Encounters:  04/21/20 153 lb 3.2 oz (69.5 kg)  01/15/20 152 lb (68.9 kg)  09/10/19 157 lb (71.2 kg)     Health Maintenance Due  Topic Date Due  . Hepatitis C Screening  Never done  . URINE MICROALBUMIN  Never done  . MAMMOGRAM  Never done  . COLONOSCOPY  Never done  . PAP SMEAR-Modifier  05/31/2019    There are no preventive care reminders to display for this patient.  Lab Results  Component Value Date   TSH 1.500 05/01/2017   Lab Results  Component Value Date   WBC 7.2 01/15/2020   HGB 15.6 01/15/2020   HCT 48.5 (H) 01/15/2020   MCV 96 01/15/2020   PLT 266 01/15/2020   Lab Results  Component Value Date   NA 136 01/15/2020   K 4.5 01/15/2020   CO2 21 01/15/2020   GLUCOSE 299 (H) 01/15/2020   BUN 13 01/15/2020   CREATININE 0.73 01/15/2020   BILITOT 0.3 01/15/2020   ALKPHOS 118 01/15/2020   AST 11 01/15/2020   ALT 20 01/15/2020   PROT 7.1 01/15/2020   ALBUMIN 4.0 01/15/2020   CALCIUM 9.8 01/15/2020   ANIONGAP  9 04/20/2016   Lab Results  Component Value Date   CHOL 183 08/03/2018   Lab Results  Component Value Date   HDL 47 08/03/2018   Lab Results  Component Value Date   LDLCALC 114 (H) 08/03/2018   Lab Results  Component Value Date   TRIG 108 08/03/2018   Lab Results  Component Value Date   CHOLHDL 3.9 08/03/2018   Lab Results  Component Value Date   HGBA1C 7.7 (A) 04/21/2020      Assessment & Plan:   Problem List Items Addressed This Visit    None    Visit Diagnoses    New onset type 2 diabetes mellitus ([place])    -  Primary   Relevant Orders   POCT URINALYSIS DIP (CLINITEK) (Completed)   HgB A1c (Completed)   Glucose (CBG), Fasting (Completed)   Dysthymia       Relevant Medications   sertraline (ZOLOFT) 50 MG tablet   Other Relevant Orders   Ambulatory referral to Psychology   Abnormal urinalysis       Relevant Orders   Urine Culture      Meds ordered this encounter  Medications  . sertraline (ZOLOFT) 50 MG tablet    Sig: Take 1 tablet (50 mg total) by mouth daily.    Dispense:  30 tablet    Refill:  3    Order Specific Question:   Supervising Provider    Answer:   Angelica Chessman E [[id number]]   8. New onset type 2 diabetes mellitus ([facility]) Hemoglobin A1c has improved greatly over the past 3 months.  3 months prior hemoglobin A1c was 12.6, on today it  is 7.7.  Patient commended for her efforts to follow a low carbohydrate diet and increase physical activity.  Discussed carbohydrate modified diet and diet options.  Given written information. - POCT URINALYSIS DIP (CLINITEK) - HgB A1c - Glucose (CBG), Fasting - Lipid Panel - Comprehensive metabolic panel  2. Dysthymia We will start a trial of Zoloft 50 mg daily.  Also, patient may benefit from psychology. - sertraline (ZOLOFT) 50 MG tablet; Take 1 tablet (50 mg total) by mouth daily.  Dispense: 30 tablet; Refill: 3 - Ambulatory referral to Psychology  3. Abnormal urinalysis Urine leukocytes present,  will follow urine culture. - Urine Culture   Follow-up: Return in about 3 months (around 07/20/2020).    Donia Pounds  APRN, MSN, FNP-C [facility] [address] [place], [place] [address] [phone]

## 2020-04-21 NOTE — Patient Instructions (Signed)
Sertraline tablets What is this medicine? SERTRALINE (SER tra leen) is used to treat depression. It may also be used to treat obsessive compulsive disorder, panic disorder, post-trauma stress, premenstrual dysphoric disorder (PMDD) or social anxiety. This medicine may be used for other purposes; ask your health care provider or pharmacist if you have questions. COMMON BRAND NAME(S): Zoloft What should I tell my health care provider before I take this medicine? They need to know if you have any of these conditions:  bleeding disorders  bipolar disorder or a family history of bipolar disorder  glaucoma  heart disease  high blood pressure  history of irregular heartbeat  history of low levels of calcium, magnesium, or potassium in the blood  if you often drink alcohol  liver disease  receiving electroconvulsive therapy  seizures  suicidal thoughts, plans, or attempt; a previous suicide attempt by you or a family member  take medicines that treat or prevent blood clots  thyroid disease  an unusual or allergic reaction to sertraline, other medicines, foods, dyes, or preservatives  pregnant or trying to get pregnant  breast-feeding How should I use this medicine? Take this medicine by mouth with a glass of water. Follow the directions on the prescription label. You can take it with or without food. Take your medicine at regular intervals. Do not take your medicine more often than directed. Do not stop taking this medicine suddenly except upon the advice of your doctor. Stopping this medicine too quickly may cause serious side effects or your condition may worsen. A special MedGuide will be given to you by the pharmacist with each prescription and refill. Be sure to read this information carefully each time. Talk to your pediatrician regarding the use of this medicine in children. While this drug may be prescribed for children as young as 7 years for selected conditions,  precautions do apply. Overdosage: If you think you have taken too much of this medicine contact a poison control center or emergency room at once. NOTE: This medicine is only for you. Do not share this medicine with others. What if I miss a dose? If you miss a dose, take it as soon as you can. If it is almost time for your next dose, take only that dose. Do not take double or extra doses. What may interact with this medicine? Do not take this medicine with any of the following medications:  cisapride  dronedarone  linezolid  MAOIs like Carbex, Eldepryl, Marplan, Nardil, and Parnate  methylene blue (injected into a vein)  pimozide  thioridazine This medicine may also interact with the following medications:  alcohol  amphetamines  aspirin and aspirin-like medicines  certain medicines for depression, anxiety, or psychotic disturbances  certain medicines for fungal infections like ketoconazole, fluconazole, posaconazole, and itraconazole  certain medicines for irregular heart beat like flecainide, quinidine, propafenone  certain medicines for migraine headaches like almotriptan, eletriptan, frovatriptan, naratriptan, rizatriptan, sumatriptan, zolmitriptan  certain medicines for sleep  certain medicines for seizures like carbamazepine, valproic acid, phenytoin  certain medicines that treat or prevent blood clots like warfarin, enoxaparin, dalteparin  cimetidine  digoxin  diuretics  fentanyl  isoniazid  lithium  NSAIDs, medicines for pain and inflammation, like ibuprofen or naproxen  other medicines that prolong the QT interval (cause an abnormal heart rhythm) like dofetilide  rasagiline  safinamide  supplements like St. John's wort, kava kava, valerian  tolbutamide  tramadol  tryptophan This list may not describe all possible interactions. Give your health care provider   a list of all the medicines, herbs, non-prescription drugs, or dietary supplements  you use. Also tell them if you smoke, drink alcohol, or use illegal drugs. Some items may interact with your medicine. What should I watch for while using this medicine? Tell your doctor if your symptoms do not get better or if they get worse. Visit your doctor or health care professional for regular checks on your progress. Because it may take several weeks to see the full effects of this medicine, it is important to continue your treatment as prescribed by your doctor. Patients and their families should watch out for new or worsening thoughts of suicide or depression. Also watch out for sudden changes in feelings such as feeling anxious, agitated, panicky, irritable, hostile, aggressive, impulsive, severely restless, overly excited and hyperactive, or not being able to sleep. If this happens, especially at the beginning of treatment or after a change in dose, call your health care professional. You may get drowsy or dizzy. Do not drive, use machinery, or do anything that needs mental alertness until you know how this medicine affects you. Do not stand or sit up quickly, especially if you are an older patient. This reduces the risk of dizzy or fainting spells. Alcohol may interfere with the effect of this medicine. Avoid alcoholic drinks. Your mouth may get dry. Chewing sugarless gum or sucking hard candy, and drinking plenty of water may help. Contact your doctor if the problem does not go away or is severe. What side effects may I notice from receiving this medicine? Side effects that you should report to your doctor or health care professional as soon as possible:  allergic reactions like skin rash, itching or hives, swelling of the face, lips, or tongue  anxious  black, tarry stools  changes in vision  confusion  elevated mood, decreased need for sleep, racing thoughts, impulsive behavior  eye pain  fast, irregular heartbeat  feeling faint or lightheaded, falls  feeling agitated,  angry, or irritable  hallucination, loss of contact with reality  loss of balance or coordination  loss of memory  painful or prolonged erections  restlessness, pacing, inability to keep still  seizures  stiff muscles  suicidal thoughts or other mood changes  trouble sleeping  unusual bleeding or bruising  unusually weak or tired  vomiting Side effects that usually do not require medical attention (report to your doctor or health care professional if they continue or are bothersome):  change in appetite or weight  change in sex drive or performance  diarrhea  increased sweating  indigestion, nausea  tremors This list may not describe all possible side effects. Call your doctor for medical advice about side effects. You may report side effects to FDA at 1-800-FDA-1088. Where should I keep my medicine? Keep out of the reach of children. Store at room temperature between 15 and 30 degrees C (59 and 86 degrees F). Throw away any unused medicine after the expiration date. NOTE: This sheet is a summary. It may not cover all possible information. If you have questions about this medicine, talk to your doctor, pharmacist, or health care provider.  2020 Elsevier/Gold Standard (2018-04-24 10:09:27)  

## 2020-04-22 ENCOUNTER — Other Ambulatory Visit: Payer: Self-pay | Admitting: Family Medicine

## 2020-04-22 DIAGNOSIS — E1169 Type 2 diabetes mellitus with other specified complication: Secondary | ICD-10-CM

## 2020-04-22 DIAGNOSIS — E785 Hyperlipidemia, unspecified: Secondary | ICD-10-CM

## 2020-04-22 LAB — COMPREHENSIVE METABOLIC PANEL
ALT: 29 IU/L (ref 0–32)
AST: 17 IU/L (ref 0–40)
Albumin/Globulin Ratio: 1.5 (ref 1.2–2.2)
Albumin: 4.8 g/dL (ref 3.8–4.9)
Alkaline Phosphatase: 89 IU/L (ref 44–121)
BUN/Creatinine Ratio: 17 (ref 9–23)
BUN: 13 mg/dL (ref 6–24)
Bilirubin Total: 0.4 mg/dL (ref 0.0–1.2)
CO2: 24 mmol/L (ref 20–29)
Calcium: 10.3 mg/dL — ABNORMAL HIGH (ref 8.7–10.2)
Chloride: 98 mmol/L (ref 96–106)
Creatinine, Ser: 0.78 mg/dL (ref 0.57–1.00)
GFR calc Af Amer: 102 mL/min/{1.73_m2} (ref >59)
GFR calc non Af Amer: 88 mL/min/{1.73_m2} (ref >59)
Globulin, Total: 3.3 g/dL (ref 1.5–4.5)
Glucose: 182 mg/dL — ABNORMAL HIGH (ref 65–99)
Potassium: 4.5 mmol/L (ref 3.5–5.2)
Sodium: 138 mmol/L (ref 134–144)
Total Protein: 8.1 g/dL (ref 6.0–8.5)

## 2020-04-22 LAB — LIPID PANEL
Chol/HDL Ratio: 3.5 ratio (ref 0.0–4.4)
Cholesterol, Total: 229 mg/dL — ABNORMAL HIGH (ref 100–199)
HDL: 66 mg/dL (ref >39)
LDL Chol Calc (NIH): 142 mg/dL — ABNORMAL HIGH (ref 0–99)
Triglycerides: 120 mg/dL (ref 0–149)
VLDL Cholesterol Cal: 21 mg/dL (ref 5–40)

## 2020-04-22 MED ORDER — ATORVASTATIN CALCIUM 20 MG PO TABS: 20.0000 mg | ORAL_TABLET | Freq: Every day | ORAL | 1 refills | Status: DC

## 2020-04-22 NOTE — Progress Notes (Signed)
Meds ordered this encounter  Medications  . atorvastatin (LIPITOR) 20 MG tablet    Sig: Take 1 tablet (20 mg total) by mouth daily.    Dispense:  90 tablet    Refill:  1    Order Specific Question:   Supervising Provider    Answer:   Quentin Angst [[id number]]    Nolon Nations  APRN, MSN, FNP-C Patient Care [facility] [address] [place], [place] [address] [phone]

## 2020-04-24 ENCOUNTER — Telehealth: Payer: Self-pay

## 2020-04-24 LAB — URINE CULTURE

## 2020-04-24 NOTE — Telephone Encounter (Signed)
-----   Message from Eloise Levels, RN sent at 04/24/2020  4:06 PM EST ----- Regarding: FW: lab results  ----- Message ----- From: Massie Maroon, FNP Sent: 04/22/2020   2:25 PM EST To: Eloise Levels, RN Subject: lab results                                    Please inform patient that cholesterol is elevated.  Total cholesterol is 229, goal is less than 200 and LDL is 142, goal is less than 100.  We will start atorvastatin 20 mg with dinner.  Also, recommend a low-fat, low carbohydrate diet divided over small meals throughout the day.  For type 2 diabetes mellitus, Metformin was increased to 1000 mg twice daily.  On yesterday, hemoglobin A1c was 7.7, no further medication changes were warranted.  Our goal A1c is less than 7.0.  Follow-up in 3 months as scheduled.  Nolon Nations  APRN, MSN, FNP-C Patient Care [facility] [address] [place], [place] [address] [phone]

## 2020-04-24 NOTE — Telephone Encounter (Signed)
Spoke to patient and informed on results w/ PCP advising. Pt. Understood.

## 2020-04-28 ENCOUNTER — Telehealth: Payer: Self-pay

## 2020-04-28 NOTE — Telephone Encounter (Signed)
Secure MyChart message sent regarding results

## 2020-04-28 NOTE — Telephone Encounter (Signed)
-----   Message from Massie Maroon, [place] sent at 04/22/2020  2:21 PM EST ----- Regarding: lab results Please inform patient that cholesterol is elevated.  Total cholesterol is 229, goal is less than 200 and LDL is 142, goal is less than 100.  We will start atorvastatin 20 mg with dinner.  Also, recommend a low-fat, low carbohydrate diet divided over small meals throughout the day.  For type 2 diabetes mellitus, Metformin was increased to 1000 mg twice daily.  On yesterday, hemoglobin A1c was 7.7, no further medication changes were warranted.  Our goal A1c is less than 7.0.  Follow-up in 3 months as scheduled.  Nolon Nations  APRN, MSN, FNP-C Patient Care [facility] [address] [place], [place] [address] [phone]

## 2020-04-29 MED FILL — METFORMIN HCL 500 MG TABS: 500 | 30 days supply | Qty: 60 | Fill #3

## 2020-05-19 ENCOUNTER — Telehealth (INDEPENDENT_AMBULATORY_CARE_PROVIDER_SITE_OTHER): Payer: 59 | Admitting: Family Medicine

## 2020-05-19 ENCOUNTER — Other Ambulatory Visit: Payer: Self-pay

## 2020-05-19 DIAGNOSIS — F320 Major depressive disorder, single episode, mild: Secondary | ICD-10-CM | POA: Insufficient documentation

## 2020-05-19 DIAGNOSIS — F341 Dysthymic disorder: Secondary | ICD-10-CM

## 2020-05-19 DIAGNOSIS — F411 Generalized anxiety disorder: Secondary | ICD-10-CM | POA: Diagnosis not present

## 2020-05-19 MED ORDER — SERTRALINE HCL 50 MG PO TABS: 50.0000 mg | ORAL_TABLET | Freq: Every day | ORAL | 1 refills | Status: DC

## 2020-05-19 NOTE — Progress Notes (Signed)
Virtual Visit via Telephone Note  I connected with Jessica Garrett on 05/19/20 at  1:00 PM EST by telephone and verified that I am speaking with the correct person using two identifiers.  Location: Patient: Home Provider: [address] [place], [place]    I discussed the limitations, risks, security and privacy concerns of performing an evaluation and management service by telephone and the availability of in person appointments. I also discussed with the patient that there may be a patient responsible charge related to this service. The patient expressed understanding and agreed to proceed.   History of Present Illness:   Jessica Garrett is a 50 year old female with a medical history significant for type 2 diabetes mellitus, hypertension, generalized anxiety, and depression presents for follow-up of anxiety and depression.  1 month prior patient was started on sertraline 50 mg daily and has been taking this medication consistently.  Patient states that symptoms of anxiety and depression have improved.  However, patient has had some life-changing events over the past month.  On Christmas eve, her best friend was killed in a head-on collision and her entire family had COVID-19 infection.  Patient states that she had increased anxiety related to those events.  She endorses excessive worrying and feelings of anxiousness.  She also endorses some sadness and hopelessness.  She denies any suicidal or homicidal intent.  Anxiety Presents for follow-up visit. Symptoms include excessive worry and nervous/anxious behavior. Patient reports no chest pain, confusion, depressed mood, feeling of choking, hyperventilation, impotence, insomnia, irritability, palpitations, panic, restlessness or suicidal ideas. Symptoms occur rarely. The severity of symptoms is moderate. The quality of sleep is fair. Nighttime awakenings: occasional.   Compliance with medications is 76-100%.    Past Medical History:  Diagnosis  Date  . Anemia    only with pregnancy  . GERD (gastroesophageal reflux disease)   . Headache    " I just go to bed when I have a headache"  . Hypertension    Social History   Socioeconomic History  . Marital status: Single    Spouse name: Not on file  . Number of children: Not on file  . Years of education: Not on file  . Highest education level: Not on file  Occupational History  . Not on file  Tobacco Use  . Smoking status: Never Smoker  . Smokeless tobacco: Never Used  Vaping Use  . Vaping Use: Never used  Substance and Sexual Activity  . Alcohol use: No    Comment: quit 03/2016  . Drug use: No  . Sexual activity: Yes    Birth control/protection: None  Other Topics Concern  . Not on file  Social History Narrative  . Not on file   Social Determinants of Health   Financial Resource Strain: Not on file  Food Insecurity: Not on file  Transportation Needs: Not on file  Physical Activity: Not on file  Stress: Not on file  Social Connections: Not on file  Intimate Partner Violence: Not on file   GAD 7 : Generalized Anxiety Score 05/19/2020 04/21/2020 08/02/2017 05/01/2017  Nervous, Anxious, on Edge 1 3 2 3   Control/stop worrying 1 3 2 2   Worry too much - different things 1 3 1 2   Trouble relaxing 1 3 2 2   Restless - 3 3 0  Easily annoyed or irritable 0 3 2 2   Afraid - awful might happen 0 3 0 1  Total GAD 7 Score - 21 12 12   Anxiety  Difficulty Not difficult at all Somewhat difficult - Somewhat difficult    Depression screen [facility] 2/9 05/19/2020 04/21/2020 09/10/2019  Decreased Interest 1 3 0  Down, Depressed, Hopeless 1 3 0  PHQ - 2 Score 2 6 0  Altered sleeping 0 3 -  Tired, decreased energy 0 3 -  Change in appetite 0 3 -  Feeling bad or failure about yourself  0 3 -  Trouble concentrating 1 3 -  Moving slowly or fidgety/restless 1 0 -  Suicidal thoughts 0 0 -  PHQ-9 Score 4 21 -  Difficult doing work/chores Not difficult at all Somewhat difficult -      Assessment and Plan: . 1. Generalized anxiety disorder Patient was previously referred to psychiatry, she declined establishing care.  She says that symptoms have been fairly controlled on current medication regimen. - sertraline (ZOLOFT) 50 MG tablet; Take 1 tablet (50 mg total) by mouth daily.  Dispense: 90 tablet; Refill: 1  2. Dysthymia - sertraline (ZOLOFT) 50 MG tablet; Take 1 tablet (50 mg total) by mouth daily.  Dispense: 90 tablet; Refill: 1 Follow Up Instructions:   Follow-up in 3 months for this problem. I discussed the assessment and treatment plan with the patient. The patient was provided an opportunity to ask questions and all were answered. The patient agreed with the plan and demonstrated an understanding of the instructions.   The patient was advised to call back or seek an in-person evaluation if the symptoms worsen or if the condition fails to improve as anticipated.  I provided 10 minutes of non-face-to-face time during this encounter.  Nolon Nations  APRN, MSN, FNP-C Patient Care [facility] [address] [place], [place] [address] [phone]

## 2020-05-25 MED FILL — METFORMIN HCL 500 MG TABS: 500 | 30 days supply | Qty: 60 | Fill #4

## 2020-06-04 MED FILL — METFORMIN HCL 500 MG TABS: 500 | 30 days supply | Qty: 60 | Fill #4

## 2020-07-21 ENCOUNTER — Encounter: Payer: Self-pay | Admitting: Family Medicine

## 2020-07-21 ENCOUNTER — Other Ambulatory Visit: Payer: Self-pay

## 2020-07-21 ENCOUNTER — Ambulatory Visit (INDEPENDENT_AMBULATORY_CARE_PROVIDER_SITE_OTHER): Payer: 59 | Admitting: Family Medicine

## 2020-07-21 VITALS — BP 184/95 | HR 65 | Temp 97.7°F | Ht 64.5 in | Wt 150.8 lb

## 2020-07-21 DIAGNOSIS — B379 Candidiasis, unspecified: Secondary | ICD-10-CM

## 2020-07-21 DIAGNOSIS — E119 Type 2 diabetes mellitus without complications: Secondary | ICD-10-CM

## 2020-07-21 DIAGNOSIS — E1169 Type 2 diabetes mellitus with other specified complication: Secondary | ICD-10-CM | POA: Diagnosis not present

## 2020-07-21 DIAGNOSIS — F411 Generalized anxiety disorder: Secondary | ICD-10-CM

## 2020-07-21 DIAGNOSIS — I10 Essential (primary) hypertension: Secondary | ICD-10-CM

## 2020-07-21 DIAGNOSIS — E785 Hyperlipidemia, unspecified: Secondary | ICD-10-CM

## 2020-07-21 DIAGNOSIS — F341 Dysthymic disorder: Secondary | ICD-10-CM

## 2020-07-21 DIAGNOSIS — R21 Rash and other nonspecific skin eruption: Secondary | ICD-10-CM

## 2020-07-21 DIAGNOSIS — T3695XA Adverse effect of unspecified systemic antibiotic, initial encounter: Secondary | ICD-10-CM

## 2020-07-21 DIAGNOSIS — H6001 Abscess of right external ear: Secondary | ICD-10-CM

## 2020-07-21 LAB — POCT GLYCOSYLATED HEMOGLOBIN (HGB A1C)
HbA1c POC (<> result, manual entry): 6.6 % (ref 4.0–5.6)
HbA1c, POC (controlled diabetic range): 6.6 % (ref 0.0–7.0)
HbA1c, POC (prediabetic range): 6.6 % — AB (ref 5.7–6.4)
Hemoglobin A1C: 6.6 % — AB (ref 4.0–5.6)

## 2020-07-21 MED ORDER — AMLODIPINE BESYLATE 10 MG PO TABS: 10.0000 mg | ORAL_TABLET | Freq: Every day | ORAL | 1 refills | Status: DC

## 2020-07-21 MED ORDER — ATORVASTATIN CALCIUM 20 MG PO TABS: 20.0000 mg | ORAL_TABLET | Freq: Every day | ORAL | 1 refills | Status: DC

## 2020-07-21 MED ORDER — SERTRALINE HCL 50 MG PO TABS: 50.0000 mg | ORAL_TABLET | Freq: Every day | ORAL | 1 refills | Status: DC

## 2020-07-21 MED ORDER — METFORMIN HCL 500 MG PO TABS
500.0000 mg | ORAL_TABLET | Freq: Two times a day (BID) | ORAL | 2 refills | Status: DC
Start: 2020-07-21 — End: 2020-12-08

## 2020-07-21 MED ORDER — DOXYCYCLINE HYCLATE 100 MG PO TABS: 100.0000 mg | ORAL_TABLET | Freq: Two times a day (BID) | ORAL | 0 refills | Status: AC

## 2020-07-21 MED ORDER — TRIAMCINOLONE ACETONIDE 0.1 % EX CREA: 1.0000 "application " | TOPICAL_CREAM | Freq: Two times a day (BID) | CUTANEOUS | 0 refills | Status: DC

## 2020-07-21 MED ORDER — FLUCONAZOLE 150 MG PO TABS: 150.0000 mg | ORAL_TABLET | Freq: Once | ORAL | 0 refills | Status: AC

## 2020-07-21 NOTE — Patient Instructions (Addendum)
Your hemoglobin A1c is 6.6, which is much improved from previous.  Your previous hemoglobin A1c was 7.7.  Continue carbohydrate modified diet divided over small meals throughout the day.  Also, your amlodipine was increased to 10 mg daily for greater control of blood pressure.  Recommend that you add low impact cardiovascular exercise about 3 days a week. For right ear abscess, will start a course of doxycycline 100 mg every 12 hours for total of 7 days.  Please complete antibiotic in its entirety.  Diflucan 150 mg x 1 for antibiotic-induced yeast infection. Please take antibiotic with food to decrease possibility of GI upset.       Skin Abscess  A skin abscess is an infected area of your skin that contains pus and other material. An abscess can happen in any part of your body. Some abscesses break open (rupture) on their own. Most continue to get worse unless they are treated. The infection can spread deeper into the body and into your blood, which can make you feel sick. A skin abscess is caused by germs that enter the skin through a cut or scrape. It can also be caused by blocked oil and sweat glands or infected hair follicles. This condition is usually treated by:  Draining the pus.  Taking antibiotic medicines.  Placing a warm, wet washcloth over the abscess. Follow these instructions at home: Medicines  Take over-the-counter and prescription medicines only as told by your doctor.  If you were prescribed an antibiotic medicine, take it as told by your doctor. Do not stop taking the antibiotic even if you start to feel better.   Abscess care  If you have an abscess that has not drained, place a warm, clean, wet washcloth over the abscess several times a day. Do this as told by your doctor.  Follow instructions from your doctor about how to take care of your abscess. Make sure you: ? Cover the abscess with a bandage (dressing). ? Change your bandage or gauze as told by your  doctor. ? Wash your hands with soap and water before you change the bandage or gauze. If you cannot use soap and water, use hand sanitizer.  Check your abscess every day for signs that the infection is getting worse. Check for: ? More redness, swelling, or pain. ? More fluid or blood. ? Warmth. ? More pus or a bad smell.   General instructions  To avoid spreading the infection: ? Do not share personal care items, towels, or hot tubs with others. ? Avoid making skin-to-skin contact with other people.  Keep all follow-up visits as told by your doctor. This is important. Contact a doctor if:  You have more redness, swelling, or pain around your abscess.  You have more fluid or blood coming from your abscess.  Your abscess feels warm when you touch it.  You have more pus or a bad smell coming from your abscess.  You have a fever.  Your muscles ache.  You have chills.  You feel sick. Get help right away if:  You have very bad (severe) pain.  You see red streaks on your skin spreading away from the abscess. Summary  A skin abscess is an infected area of your skin that contains pus and other material.  The abscess is caused by germs that enter the skin through a cut or scrape. It can also be caused by blocked oil and sweat glands or infected hair follicles.  Follow your doctor's instructions on  caring for your abscess, taking medicines, preventing infections, and keeping follow-up visits. This information is not intended to replace advice given to you by your health care provider. Make sure you discuss any questions you have with your health care provider. Document Revised: 12/06/2018 Document Reviewed: 06/15/2017 Elsevier Patient Education  2021 [employer].

## 2020-07-21 NOTE — Progress Notes (Signed)
Patient [facility]   Established Patient Office Visit  Subjective:  Patient ID: Jessica Garrett, female    DOB: 02-Dec-1970  Age: 50 y.o. MRN: [id number]  CC:  Chief Complaint  Patient presents with  . Follow-up    3 month follow up having some ear pain behind her ear rt side started about 3 weeks ago.    HPI  Jessica Garrett is a 50 year old female with a medical history significant for type 2 diabetes mellitus, hypertension, and generalized anxiety presents for follow-up of chronic conditions.  Patient is also complaining of pain behind right ear for greater than 3 weeks.  Patient says that she has been doing well with chronic conditions, however she has been out of her medications for over a week.  She is not gotten to pharmacy for refills.  She has not been checking blood pressure or blood glucose at home.  She has been following a low carbohydrate diet divided over small meals throughout the day.  She denies any headache, chest pain, urinary symptoms, polyuria, polydipsia, or polyphagia.  Patient does endorse periodic blurred vision.  She was previously referred to ophthalmology, but did not schedule an appointment.  She is requesting a new ophthalmology referral.  She states that her brother has some blindness as a result of uncontrolled type 2 diabetes and she is concerned that this problem will occur. Patient is complaining of some pain behind right ear for greater than 3 weeks.  She says that she has been applying over-the-counter alcohol and peroxide to skin eruption, however it continues to drain and is hard and tender to touch.  She denies any injury.  This problem is primarily external, no internal ear pain present.  Diabetes She has type 2 diabetes mellitus. Her disease course has been stable. Pertinent negatives for hypoglycemia include no sweats. Pertinent negatives for diabetes include no blurred vision, no chest pain, no fatigue, no foot  paresthesias, no foot ulcerations, no polydipsia, no polyphagia, no polyuria, no weakness and no weight loss. Pertinent negatives for hypoglycemia complications include no hospitalization, no nocturnal hypoglycemia, no required assistance and no required glucagon injection. Risk factors for coronary artery disease include hypertension and sedentary lifestyle. She is compliant with treatment some of the time. She is following a diabetic and generally healthy diet. An ACE inhibitor/angiotensin II receptor blocker is not being taken. She does not see a podiatrist.Eye exam is not current.  Hypertension This is a chronic problem. The problem is uncontrolled. Associated symptoms include anxiety. Pertinent negatives include no blurred vision, chest pain, malaise/fatigue, peripheral edema, shortness of breath or sweats. Risk factors for coronary artery disease include sedentary lifestyle and dyslipidemia.    Past Medical History:  Diagnosis Date  . Anemia    only with pregnancy  . GERD (gastroesophageal reflux disease)   . Headache    " I just go to bed when I have a headache"  . Hypertension     Past Surgical History:  Procedure Laterality Date  . CHOLECYSTECTOMY N/A 04/26/2016   Procedure: LAPAROSCOPIC CHOLECYSTECTOMY;  Surgeon: Ralene Ok, MD;  Location: [place];  Service: General;  Laterality: N/A;  . [place]  . VAGINAL DELIVERY     x3  . WISDOM TOOTH EXTRACTION     only on the R side , pt. reports that she was a wake for the procedure     Family History  Problem Relation Age of Onset  . Heart disease Mother   .  Hypertension Father   . Diabetes Brother   . Hypertension Brother   . Diabetes Maternal Grandmother     Social History   Socioeconomic History  . Marital status: Single    Spouse name: Not on file  . Number of children: Not on file  . Years of education: Not on file  . Highest education level: Not on file  Occupational History  . Not on file  Tobacco  Use  . Smoking status: Never Smoker  . Smokeless tobacco: Never Used  Vaping Use  . Vaping Use: Never used  Substance and Sexual Activity  . Alcohol use: No    Comment: quit 03/2016  . Drug use: No  . Sexual activity: Yes    Birth control/protection: None  Other Topics Concern  . Not on file  Social History Narrative  . Not on file   Social Determinants of Health   Financial Resource Strain: Not on file  Food Insecurity: Not on file  Transportation Needs: Not on file  Physical Activity: Not on file  Stress: Not on file  Social Connections: Not on file  Intimate Partner Violence: Not on file    Outpatient Medications Prior to Visit  Medication Sig Dispense Refill  . amLODipine (NORVASC) 5 MG tablet Take 1 tablet (5 mg total) by mouth daily. 90 tablet 1  . atorvastatin (LIPITOR) 20 MG tablet Take 1 tablet (20 mg total) by mouth daily. 90 tablet 1  . BLACK COHOSH EXTRACT PO Take by mouth. Taking 1 tablet once a day    . Blood Glucose Monitoring Suppl (TRUE METRIX GO GLUCOSE METER) w/Device KIT 1 each by Does not apply route 2 (two) times daily. 1 kit 0  . cetirizine (ZYRTEC) 10 MG tablet Take 1 tablet (10 mg total) by mouth daily. 30 tablet 11  . gabapentin (NEURONTIN) 100 MG capsule Take 1 capsule (100 mg total) by mouth 3 (three) times daily. 90 capsule 1  . glucose blood test strip Use as instructed 100 each 12  . ibuprofen (ADVIL,MOTRIN) 200 MG tablet Take 400 mg by mouth every 6 (six) hours as needed for mild pain.    . Lancets Ultra Fine MISC 1 each by Does not apply route 2 (two) times daily. 100 each 6  . metFORMIN (GLUCOPHAGE) 500 MG tablet Take 1 tablet (500 mg total) by mouth 2 (two) times daily with a meal. 60 tablet 5  . sertraline (ZOLOFT) 50 MG tablet Take 1 tablet (50 mg total) by mouth daily. 90 tablet 1  . fluticasone (FLONASE) 50 MCG/ACT nasal spray Place 2 sprays into both nostrils daily. 16 g 6   No facility-administered medications prior to visit.    No  Known Allergies  ROS Review of Systems  Constitutional: Negative for fatigue, malaise/fatigue and weight loss.  Eyes: Negative for blurred vision.  Respiratory: Negative for shortness of breath.   Cardiovascular: Negative for chest pain.  Endocrine: Negative for polydipsia, polyphagia and polyuria.  Neurological: Negative for weakness.      Objective:    Physical Exam Constitutional:      Appearance: Normal appearance.  Eyes:     Pupils: Pupils are equal, round, and reactive to light.  Cardiovascular:     Rate and Rhythm: Normal rate and regular rhythm.     Pulses: Normal pulses.  Pulmonary:     Effort: Pulmonary effort is normal.  Abdominal:     General: Bowel sounds are normal.  Musculoskeletal:  General: Normal range of motion.  Skin:    General: Skin is warm.     Findings: Rash present.          Comments: Right ear abscess, 80% granulated, indurated, erythema  Neurological:     General: No focal deficit present.     Mental Status: She is alert.  Psychiatric:        Mood and Affect: Mood normal.        Thought Content: Thought content normal.     BP (!) 182/95   Pulse 65   Temp 97.7 F (36.5 C)   Ht 5' 4.5" (1.638 m)   Wt 150 lb 12.8 oz (68.4 kg)   SpO2 98%   BMI 25.49 kg/m  Wt Readings from Last 3 Encounters:  07/21/20 150 lb 12.8 oz (68.4 kg)  04/21/20 153 lb 3.2 oz (69.5 kg)  01/15/20 152 lb (68.9 kg)     Health Maintenance Due  Topic Date Due  . Hepatitis C Screening  Never done  . URINE MICROALBUMIN  Never done  . COLONOSCOPY (Pts 45-61yr Insurance coverage will need to be confirmed)  Never done  . MAMMOGRAM  Never done  . PAP SMEAR-Modifier  05/31/2019  . COVID-19 Vaccine (2 - Booster for Janssen series) 09/18/2019    There are no preventive care reminders to display for this patient.  Lab Results  Component Value Date   TSH 1.500 05/01/2017   Lab Results  Component Value Date   WBC 7.2 01/15/2020   HGB 15.6 01/15/2020    HCT 48.5 (H) 01/15/2020   MCV 96 01/15/2020   PLT 266 01/15/2020   Lab Results  Component Value Date   NA 138 04/21/2020   K 4.5 04/21/2020   CO2 24 04/21/2020   GLUCOSE 182 (H) 04/21/2020   BUN 13 04/21/2020   CREATININE 0.78 04/21/2020   BILITOT 0.4 04/21/2020   ALKPHOS 89 04/21/2020   AST 17 04/21/2020   ALT 29 04/21/2020   PROT 8.1 04/21/2020   ALBUMIN 4.8 04/21/2020   CALCIUM 10.3 (H) 04/21/2020   ANIONGAP 9 04/20/2016   Lab Results  Component Value Date   CHOL 229 (H) 04/21/2020   Lab Results  Component Value Date   HDL 66 04/21/2020   Lab Results  Component Value Date   LDLCALC 142 (H) 04/21/2020   Lab Results  Component Value Date   TRIG 120 04/21/2020   Lab Results  Component Value Date   CHOLHDL 3.5 04/21/2020   Lab Results  Component Value Date   HGBA1C 6.6 (A) 07/21/2020   HGBA1C 6.6 07/21/2020   HGBA1C 6.6 (A) 07/21/2020   HGBA1C 6.6 07/21/2020      Assessment & Plan:   Problem List Items Addressed This Visit      Cardiovascular and Mediastinum   Essential hypertension   Relevant Medications   amLODipine (NORVASC) 10 MG tablet   atorvastatin (LIPITOR) 20 MG tablet     Other   Generalized anxiety disorder   Relevant Medications   sertraline (ZOLOFT) 50 MG tablet    Other Visit Diagnoses    Hyperlipidemia associated with type 2 diabetes mellitus ([place]    -  Primary   Relevant Medications   metFORMIN (GLUCOPHAGE) 500 MG tablet   atorvastatin (LIPITOR) 20 MG tablet   Other Relevant Orders   HgB A1c (Completed)   POCT Urinalysis Dipstick   Comprehensive metabolic panel   New onset type 2 diabetes mellitus ([place]  Relevant Medications   metFORMIN (GLUCOPHAGE) 500 MG tablet   atorvastatin (LIPITOR) 20 MG tablet   Other Relevant Orders   Ambulatory referral to Ophthalmology   Dysthymia       Relevant Medications   sertraline (ZOLOFT) 50 MG tablet   Abscess of right external ear       Relevant Medications   doxycycline  (VIBRA-TABS) 100 MG tablet   Antibiotic-induced yeast infection       Generalized rash       Relevant Medications   triamcinolone (KENALOG) 0.1 %     1. Hyperlipidemia associated with type 2 diabetes mellitus (HCC)  - HgB A1c - POCT Urinalysis Dipstick - Comprehensive metabolic panel - atorvastatin (LIPITOR) 20 MG tablet; Take 1 tablet (20 mg total) by mouth daily.  Dispense: 90 tablet; Refill: 1  2. Generalized anxiety disorder - sertraline (ZOLOFT) 50 MG tablet; Take 1 tablet (50 mg total) by mouth daily.  Dispense: 90 tablet; Refill: 1  3. Essential hypertension BP (!) 184/95   Pulse 65   Temp 97.7 F (36.5 C)   Ht 5' 4.5" (1.638 m)   Wt 150 lb 12.8 oz (68.4 kg)   SpO2 98%   BMI 25.49 kg/m  Patient has been out of medication over the past week.  Discussed the importance of taking medications consistently in order to achieve positive outcomes.  Patient will follow up for blood pressure check in 1 week.  - amLODipine (NORVASC) 10 MG tablet; Take 1 tablet (10 mg total) by mouth daily.  Dispense: 90 tablet; Refill: 1  4. New onset type 2 diabetes mellitus (HCC) - metFORMIN (GLUCOPHAGE) 500 MG tablet; Take 1 tablet (500 mg total) by mouth 2 (two) times daily with a meal.  Dispense: 180 tablet; Refill: 2 - Ambulatory referral to Ophthalmology  5. Dysthymia - sertraline (ZOLOFT) 50 MG tablet; Take 1 tablet (50 mg total) by mouth daily.  Dispense: 90 tablet; Refill: 1  6. Abscess of right external ear - doxycycline (VIBRA-TABS) 100 MG tablet; Take 1 tablet (100 mg total) by mouth 2 (two) times daily for 7 days.  Dispense: 14 tablet; Refill: 0  7. Antibiotic-induced yeast infection - fluconazole (DIFLUCAN) 150 MG tablet; Take 1 tablet (150 mg total) by mouth once for 1 dose.  Dispense: 1 tablet; Refill: 0  8. Generalized rash - triamcinolone (KENALOG) 0.1 %; Apply 1 application topically 2 (two) times daily.  Dispense: 30 g; Refill: 0   Follow-up: Return in about 3 months  (around 10/21/2020).   Donia Pounds  APRN, MSN, FNP-C Patient [facility] [address] [place], [place] [address] [phone]

## 2020-07-23 ENCOUNTER — Telehealth: Payer: Self-pay

## 2020-07-23 NOTE — Telephone Encounter (Signed)
Twanda called for pt from number [phone] Pharmacy for medicine tricinalone  For cream or ointment?

## 2020-07-28 ENCOUNTER — Telehealth: Payer: Self-pay

## 2020-07-28 NOTE — Telephone Encounter (Signed)
Called and spoke w/ [facility]  Regarding patient kenalog 0.1% per [place] is to have ointment,

## 2020-08-16 ENCOUNTER — Other Ambulatory Visit: Payer: Self-pay

## 2020-08-16 MED ORDER — TRUE METRIX BLOOD GLUCOSE TEST VI STRP: ORAL_STRIP | 12 refills | Status: AC

## 2020-08-31 ENCOUNTER — Ambulatory Visit
Admission: EM | Admit: 2020-08-31 | Discharge: 2020-08-31 | Disposition: A | Discharge status: Home / Self Care | Payer: 59 | Attending: Family Medicine | Admitting: Family Medicine

## 2020-08-31 ENCOUNTER — Other Ambulatory Visit: Payer: Self-pay

## 2020-08-31 DIAGNOSIS — L089 Local infection of the skin and subcutaneous tissue, unspecified: Secondary | ICD-10-CM

## 2020-08-31 DIAGNOSIS — L259 Unspecified contact dermatitis, unspecified cause: Secondary | ICD-10-CM | POA: Diagnosis not present

## 2020-08-31 DIAGNOSIS — L02212 Cutaneous abscess of back [any part, except buttock]: Secondary | ICD-10-CM

## 2020-08-31 MED ORDER — PREDNISONE 10 MG PO TABS: 10.0000 mg | ORAL_TABLET | Freq: Every day | ORAL | 0 refills | Status: AC

## 2020-08-31 MED ORDER — DOXYCYCLINE HYCLATE 100 MG PO CAPS: 100.0000 mg | ORAL_CAPSULE | Freq: Two times a day (BID) | ORAL | 0 refills | Status: DC

## 2020-08-31 MED ORDER — TRIAMCINOLONE ACETONIDE 0.1 % EX CREA: 1.0000 "application " | TOPICAL_CREAM | Freq: Two times a day (BID) | CUTANEOUS | 0 refills | Status: DC

## 2020-08-31 NOTE — ED Provider Notes (Signed)
[facility]    CSN: [id number] Arrival date & time: 08/31/20  1108      History   Chief Complaint Chief Complaint  Patient presents with  . Abscess    HPI Jessica Garrett is a 50 y.o. female.   HPI Patient presents with erythematous indurated papule on back  which she suspects is a possible bug bite that has subsequently become infected. She is diabetic. Uncertain of how the long the papule has been present. Also has a generalized pruritic rash involving her ankles which has been recurrent. Unaware of any known irritant. No fever, recent illness, nausea, or vomiting. Past Medical History:  Diagnosis Date  . Anemia    only with pregnancy  . GERD (gastroesophageal reflux disease)   . Headache    " I just go to bed when I have a headache"  . Hypertension     Patient Active Problem List   Diagnosis Date Noted  . Current mild episode of major depressive disorder without prior episode ([place]) 05/19/2020  . Right foot pain 09/10/2019  . Generalized anxiety disorder 05/01/2017  . Chronic right shoulder pain 09/26/2016  . Essential hypertension 05/05/2016    Past Surgical History:  Procedure Laterality Date  . CHOLECYSTECTOMY N/A 04/26/2016   Procedure: LAPAROSCOPIC CHOLECYSTECTOMY;  Surgeon: Ralene Ok, MD;  Location: [facility];  Service: General;  Laterality: N/A;  . [place]  . VAGINAL DELIVERY     x3  . WISDOM TOOTH EXTRACTION     only on the R side , pt. reports that she was a wake for the procedure     OB History   No obstetric history on file.      Home Medications    Prior to Admission medications   Medication Sig Start Date End Date Taking? Authorizing Provider  doxycycline (VIBRAMYCIN) 100 MG capsule Take 1 capsule (100 mg total) by mouth 2 (two) times daily. 08/31/20  Yes Scot Jun, FNP  predniSONE (DELTASONE) 10 MG tablet Take 1 tablet (10 mg total) by mouth daily with breakfast for 5 days. 08/31/20 09/05/20 Yes Scot Jun, FNP  triamcinolone cream (KENALOG) 0.1 % Apply 1 application topically 2 (two) times daily. Apply to affected areas until rash resolves. 08/31/20  Yes Scot Jun, FNP  amLODipine (NORVASC) 10 MG tablet Take 1 tablet (10 mg total) by mouth daily. 07/21/20   Dorena Dew, FNP  atorvastatin (LIPITOR) 20 MG tablet Take 1 tablet (20 mg total) by mouth daily. 07/21/20   Dorena Dew, FNP  BLACK COHOSH EXTRACT PO Take by mouth. Taking 1 tablet once a day    [provider]  Blood Glucose Monitoring Suppl (TRUE METRIX GO GLUCOSE METER) w/Device KIT 1 each by Does not apply route 2 (two) times daily. 01/15/20   Dorena Dew, FNP  cetirizine (ZYRTEC) 10 MG tablet Take 1 tablet (10 mg total) by mouth daily. 07/20/16   Dorena Dew, FNP  fluticasone (FLONASE) 50 MCG/ACT nasal spray Place 2 sprays into both nostrils daily. 08/02/17   Dorena Dew, FNP  gabapentin (NEURONTIN) 100 MG capsule Take 1 capsule (100 mg total) by mouth 3 (three) times daily. 01/16/20   Dorena Dew, FNP  glucose blood (TRUE METRIX BLOOD GLUCOSE TEST) test strip USE AS INSTRUCTED 01/15/20 01/14/21  Dorena Dew, FNP  glucose blood test strip Use as instructed 01/15/20   Dorena Dew, FNP  ibuprofen (ADVIL,MOTRIN) 200 MG tablet Take 400  mg by mouth every 6 (six) hours as needed for mild pain.    [provider]  Lancets Ultra Fine MISC 1 each by Does not apply route 2 (two) times daily. 01/15/20   Dorena Dew, FNP  metFORMIN (GLUCOPHAGE) 500 MG tablet Take 1 tablet (500 mg total) by mouth 2 (two) times daily with a meal. 07/21/20   Dorena Dew, FNP  sertraline (ZOLOFT) 50 MG tablet Take 1 tablet (50 mg total) by mouth daily. 07/21/20   Dorena Dew, FNP  TRUEplus Lancets 28G MISC USE AS DIRECTED TWICE DAILY 01/15/20 01/14/21  Dorena Dew, FNP    Family History Family History  Problem Relation Age of Onset  . Heart disease Mother   . Hypertension Father   .  Diabetes Brother   . Hypertension Brother   . Diabetes Maternal Grandmother     Social History Social History   Tobacco Use  . Smoking status: Never Smoker  . Smokeless tobacco: Never Used  Vaping Use  . Vaping Use: Never used  Substance Use Topics  . Alcohol use: No    Comment: quit 03/2016  . Drug use: No     Allergies   Patient has no known allergies.   Review of Systems Review of Systems Pertinent negatives listed in HPI   Physical Exam Triage Vital Signs ED Triage Vitals  Enc Vitals Group     BP 08/31/20 1201 137/87     Pulse Rate 08/31/20 1201 67     Resp 08/31/20 1201 20     Temp 08/31/20 1201 97.6 F (36.4 C)     Temp src --      SpO2 08/31/20 1201 97 %     Weight --      Height --      Head Circumference --      Peak Flow --      Pain Score 08/31/20 1159 10     Pain Loc --      Pain Edu? --      Excl. in [place]? --    No data found.  Updated Vital Signs BP 137/87   Pulse 67   Temp 97.6 F (36.4 C)   Resp 20   LMP  (LMP Unknown)   SpO2 97%   Visual Acuity Right Eye Distance:   Left Eye Distance:   Bilateral Distance:    Right Eye Near:   Left Eye Near:    Bilateral Near:     Physical Exam General appearance: alert, well developed, well nourished, cooperative  Head: Normocephalic, without obvious abnormality, atraumatic Respiratory: Respirations even and unlabored, normal respiratory rate Heart: rate and rhythm normal. No gallop or murmurs noted on exam  Abdomen: BS +, no distention, no rebound tenderness, or no mass Extremities: No gross deformities Skin: indurated 5 mm lesion on back (left), erythematous macularpapular rash on bilateral ankles,  with surrounding erythema, Skin color, texture, turgor normal. No rashes seen  Psych: Appropriate mood and affect. Neurologic: GCS 15, normal coordination, normal gait  UC Treatments / Results  Labs (all labs ordered are listed, but only abnormal results are displayed) Labs Reviewed - No  data to display  EKG   Radiology No results found.  Procedures Procedures (including critical care time)  Medications Ordered in UC Medications - No data to display  Initial Impression / Assessment and Plan / UC Course  I have reviewed the triage vital signs and the nursing notes.  Pertinent labs & imaging  results that were available during my care of the patient were reviewed by me and considered in my medical decision making (see chart for details).     Contact dermatitis of unknown etiology, triamcinolone cream BID PRN Abscess left back, doxycyline, return precautions if worsen, for possible I&D. I&D not indicated at present   Prednisone (low dose) for diffuse rash on ankles, concern for secondary infection if patient sustains skin abrasions from scratching therefore prednisone to blunt current reaction. PCP follow-up as needed. Final Clinical Impressions(s) / UC Diagnoses   Final diagnoses:  Contact dermatitis, unspecified contact dermatitis type, unspecified trigger  Abscess of back, left  Skin infection     Discharge Instructions     Take medication as directed. Return here if insect bite wound doesn't completely resolve with antibiotics as it may require draining. You are on a low dose of prednisone which can cause your blood sugar to increase temporarily. Drink plenty of water while taking medication and monitor your blood sugars.   ED Prescriptions    Medication Sig Dispense Auth. Provider   predniSONE (DELTASONE) 10 MG tablet Take 1 tablet (10 mg total) by mouth daily with breakfast for 5 days. 5 tablet Scot Jun, FNP   triamcinolone cream (KENALOG) 0.1 % Apply 1 application topically 2 (two) times daily. Apply to affected areas until rash resolves. 454 g Scot Jun, FNP   doxycycline (VIBRAMYCIN) 100 MG capsule Take 1 capsule (100 mg total) by mouth 2 (two) times daily. 20 capsule Scot Jun, FNP     PDMP not reviewed this encounter.    Scot Jun, [place] 09/09/20 [phone]

## 2020-08-31 NOTE — Discharge Instructions (Addendum)
Take medication as directed. Return here if insect bite wound doesn't completely resolve with antibiotics as it may require draining. You are on a low dose of prednisone which can cause your blood sugar to increase temporarily. Drink plenty of water while taking medication and monitor your blood sugars.

## 2020-08-31 NOTE — ED Triage Notes (Signed)
Pt presents with possible insect bite on back , area has became hard red and painful, pt also has rash on ankles

## 2020-10-04 IMAGING — DX DG FOOT COMPLETE 3+V*L*
3 series · 3 of 3 positions shown · non-contrast
Comparison: None.

CLINICAL DATA: Pt c/o pain and swelling in dorsal and lateral side
of left foot x1 month Pt states she stands a lot for work and feels
pain weight bearing and while laying down Pt states occasional
shooting pain up into her left leg No surgical Hx

EXAM:
LEFT FOOT - COMPLETE 3+ VIEW

[foot ap]
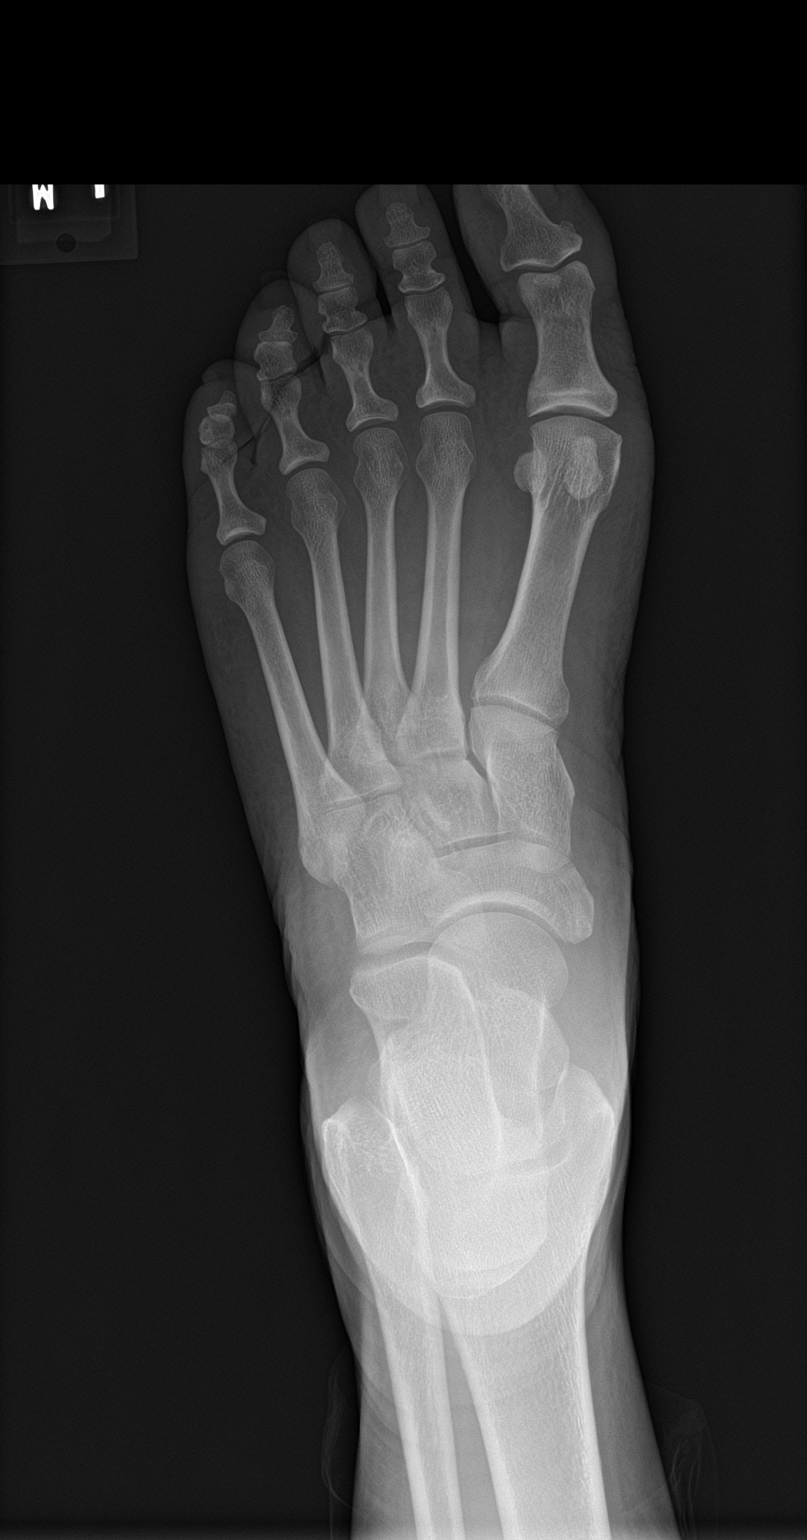

[foot obl]
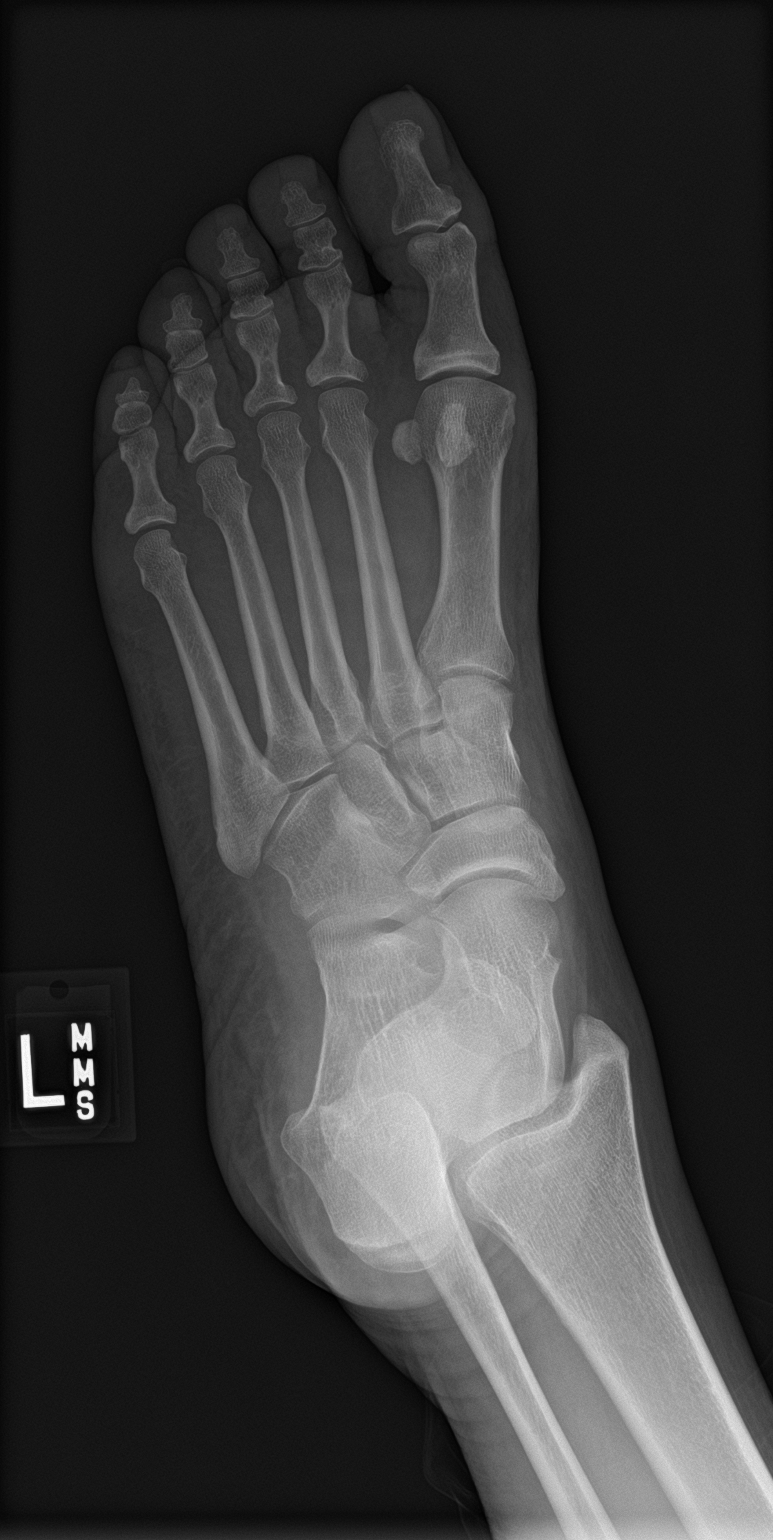

[foot lat]
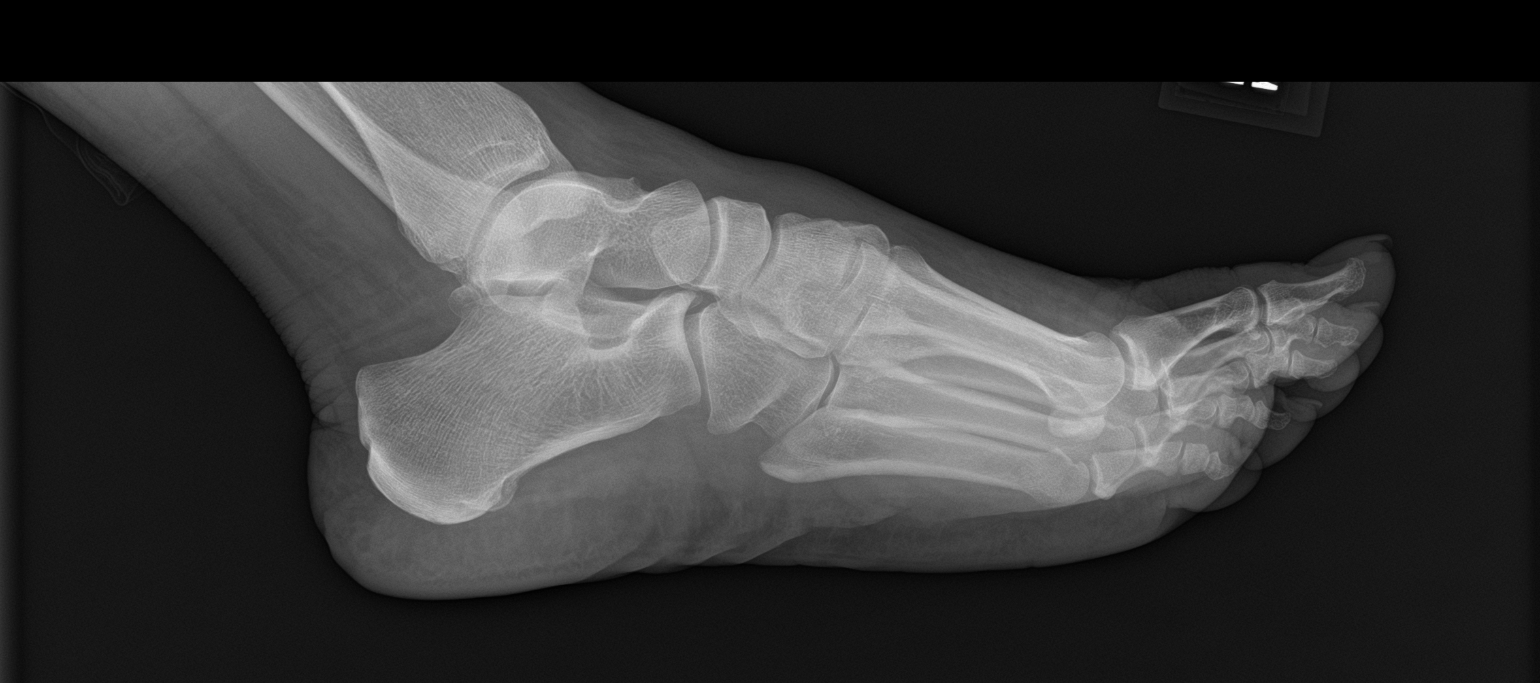

[3 of 3 positions shown; findings below may reference images not displayed]

FINDINGS: There is no evidence of fracture or dislocation. There is no
evidence of arthropathy or other focal bone abnormality. Soft
tissues are unremarkable.
IMPRESSION: Negative.

## 2020-10-27 ENCOUNTER — Ambulatory Visit: Payer: Self-pay | Admitting: Family Medicine

## 2020-12-08 ENCOUNTER — Other Ambulatory Visit: Payer: Self-pay

## 2020-12-08 ENCOUNTER — Ambulatory Visit (INDEPENDENT_AMBULATORY_CARE_PROVIDER_SITE_OTHER): Payer: Self-pay | Admitting: Family Medicine

## 2020-12-08 ENCOUNTER — Encounter: Payer: Self-pay | Admitting: Family Medicine

## 2020-12-08 VITALS — BP 134/69 | HR 70 | Temp 97.8°F | Ht 64.5 in | Wt 150.0 lb

## 2020-12-08 DIAGNOSIS — E119 Type 2 diabetes mellitus without complications: Secondary | ICD-10-CM

## 2020-12-08 DIAGNOSIS — I10 Essential (primary) hypertension: Secondary | ICD-10-CM

## 2020-12-08 DIAGNOSIS — Z1159 Encounter for screening for other viral diseases: Secondary | ICD-10-CM

## 2020-12-08 DIAGNOSIS — Z1211 Encounter for screening for malignant neoplasm of colon: Secondary | ICD-10-CM

## 2020-12-08 DIAGNOSIS — Z1231 Encounter for screening mammogram for malignant neoplasm of breast: Secondary | ICD-10-CM

## 2020-12-08 LAB — POCT URINALYSIS DIPSTICK
Bilirubin, UA: NEGATIVE
Glucose, UA: NEGATIVE
Ketones, UA: NEGATIVE
Leukocytes, UA: NEGATIVE
Nitrite, UA: NEGATIVE
Protein, UA: POSITIVE — AB
Spec Grav, UA: >=1.03 — AB (ref 1.010–1.025)
Urobilinogen, UA: 0.2 E.U./dL
pH, UA: 5.5 (ref 5.0–8.0)

## 2020-12-08 LAB — POCT GLYCOSYLATED HEMOGLOBIN (HGB A1C)
HbA1c POC (<> result, manual entry): 8.3 % (ref 4.0–5.6)
HbA1c, POC (controlled diabetic range): 8.3 % — AB (ref 0.0–7.0)
HbA1c, POC (prediabetic range): 8.3 % — AB (ref 5.7–6.4)
Hemoglobin A1C: 8.3 % — AB (ref 4.0–5.6)

## 2020-12-08 LAB — GLUCOSE, POCT (MANUAL RESULT ENTRY): POC Glucose: 161 mg/dl — AB (ref 70–99)

## 2020-12-08 MED ORDER — METFORMIN HCL 1000 MG PO TABS: 1000.0000 mg | ORAL_TABLET | Freq: Two times a day (BID) | ORAL | 2 refills | Status: DC

## 2020-12-08 NOTE — Patient Instructions (Signed)
Diabetes Mellitus and Nutrition, Adult When you have diabetes, or diabetes mellitus, it is very important to have healthy eating habits because your blood sugar (glucose) levels are greatly affected by what you eat and drink. Eating healthy foods in the right amounts, at about the same times every day, can help you:  Control your blood glucose.  Lower your risk of heart disease.  Improve your blood pressure.  Reach or maintain a healthy weight. What can affect my meal plan? Every person with diabetes is different, and each person has different needs for a meal plan. Your health care provider may recommend that you work with a dietitian to make a meal plan that is best for you. Your meal plan may vary depending on factors such as:  The calories you need.  The medicines you take.  Your weight.  Your blood glucose, blood pressure, and cholesterol levels.  Your activity level.  Other health conditions you have, such as heart or kidney disease. How do carbohydrates affect me? Carbohydrates, also called carbs, affect your blood glucose level more than any other type of food. Eating carbs naturally raises the amount of glucose in your blood. Carb counting is a method for keeping track of how many carbs you eat. Counting carbs is important to keep your blood glucose at a healthy level, especially if you use insulin or take certain oral diabetes medicines. It is important to know how many carbs you can safely have in each meal. This is different for every person. Your dietitian can help you calculate how many carbs you should have at each meal and for each snack. How does alcohol affect me? Alcohol can cause a sudden decrease in blood glucose (hypoglycemia), especially if you use insulin or take certain oral diabetes medicines. Hypoglycemia can be a life-threatening condition. Symptoms of hypoglycemia, such as sleepiness, dizziness, and confusion, are similar to symptoms of having too much  alcohol.  Do not drink alcohol if: ? Your health care provider tells you not to drink. ? You are pregnant, may be pregnant, or are planning to become pregnant.  If you drink alcohol: ? Do not drink on an empty stomach. ? Limit how much you use to:  0-1 drink a day for women.  0-2 drinks a day for men. ? Be aware of how much alcohol is in your drink. In the [place] one drink equals one 12 oz bottle of beer (355 mL), one 5 oz glass of wine (148 mL), or one 1 oz glass of hard liquor (44 mL). ? Keep yourself hydrated with water, diet soda, or unsweetened iced tea.  Keep in mind that regular soda, juice, and other mixers may contain a lot of sugar and must be counted as carbs. What are tips for following this plan? Reading food labels  Start by checking the serving size on the "Nutrition Facts" label of packaged foods and drinks. The amount of calories, carbs, fats, and other nutrients listed on the label is based on one serving of the item. Many items contain more than one serving per package.  Check the total grams (g) of carbs in one serving. You can calculate the number of servings of carbs in one serving by dividing the total carbs by 15. For example, if a food has 30 g of total carbs per serving, it would be equal to 2 servings of carbs.  Check the number of grams (g) of saturated fats and trans fats in one serving. Choose foods that have   a low amount or none of these fats.  Check the number of milligrams (mg) of salt (sodium) in one serving. Most people should limit total sodium intake to less than 2,300 mg per day.  Always check the nutrition information of foods labeled as "low-fat" or "nonfat." These foods may be higher in added sugar or refined carbs and should be avoided.  Talk to your dietitian to identify your daily goals for nutrients listed on the label. Shopping  Avoid buying canned, pre-made, or processed foods. These foods tend to be high in fat, sodium, and added  sugar.  Shop around the outside edge of the grocery store. This is where you will most often find fresh fruits and vegetables, bulk grains, fresh meats, and fresh dairy. Cooking  Use low-heat cooking methods, such as baking, instead of high-heat cooking methods like deep frying.  Cook using healthy oils, such as olive, canola, or sunflower oil.  Avoid cooking with butter, cream, or high-fat meats. Meal planning  Eat meals and snacks regularly, preferably at the same times every day. Avoid going long periods of time without eating.  Eat foods that are high in fiber, such as fresh fruits, vegetables, beans, and whole grains. Talk with your dietitian about how many servings of carbs you can eat at each meal.  Eat 4-6 oz (112-168 g) of lean protein each day, such as lean meat, chicken, fish, eggs, or tofu. One ounce (oz) of lean protein is equal to: ? 1 oz (28 g) of meat, chicken, or fish. ? 1 egg. ?  cup (62 g) of tofu.  Eat some foods each day that contain healthy fats, such as avocado, nuts, seeds, and fish.   What foods should I eat? Fruits Berries. Apples. Oranges. Peaches. Apricots. Plums. Grapes. Mango. Papaya. Pomegranate. Kiwi. Cherries. Vegetables Lettuce. Spinach. Leafy greens, including kale, chard, collard greens, and mustard greens. Beets. Cauliflower. Cabbage. Broccoli. Carrots. Green beans. Tomatoes. Peppers. Onions. Cucumbers. Brussels sprouts. Grains Whole grains, such as whole-wheat or whole-grain bread, crackers, tortillas, cereal, and pasta. Unsweetened oatmeal. Quinoa. Brown or wild rice. Meats and other proteins Seafood. Poultry without skin. Lean cuts of poultry and beef. Tofu. Nuts. Seeds. Dairy Low-fat or fat-free dairy products such as milk, yogurt, and cheese. The items listed above may not be a complete list of foods and beverages you can eat. Contact a dietitian for more information. What foods should I avoid? Fruits Fruits canned with  syrup. Vegetables Canned vegetables. Frozen vegetables with butter or cream sauce. Grains Refined white flour and flour products such as bread, pasta, snack foods, and cereals. Avoid all processed foods. Meats and other proteins Fatty cuts of meat. Poultry with skin. Breaded or fried meats. Processed meat. Avoid saturated fats. Dairy Full-fat yogurt, cheese, or milk. Beverages Sweetened drinks, such as soda or iced tea. The items listed above may not be a complete list of foods and beverages you should avoid. Contact a dietitian for more information. Questions to ask a health care provider  Do I need to meet with a diabetes educator?  Do I need to meet with a dietitian?  What number can I call if I have questions?  When are the best times to check my blood glucose? Where to find more information:  American Diabetes Association: diabetes.org  Academy of Nutrition and Dietetics: www.eatright.org  National Institute of Diabetes and Digestive and Kidney Diseases: www.niddk.nih.gov  Association of Diabetes Care and Education Specialists: www.diabeteseducator.org Summary  It is important to have healthy eating   habits because your blood sugar (glucose) levels are greatly affected by what you eat and drink.  A healthy meal plan will help you control your blood glucose and maintain a healthy lifestyle.  Your health care provider may recommend that you work with a dietitian to make a meal plan that is best for you.  Keep in mind that carbohydrates (carbs) and alcohol have immediate effects on your blood glucose levels. It is important to count carbs and to use alcohol carefully. This information is not intended to replace advice given to you by your health care provider. Make sure you discuss any questions you have with your health care provider. Document Revised: 04/09/2019 Document Reviewed: 04/09/2019 Elsevier Patient Education  2021 [employer]  

## 2020-12-08 NOTE — Progress Notes (Signed)
Patient [facility]   Established Patient Office Visit  Subjective:  Patient ID: Jessica Garrett, female    DOB: March 17, 1971  Age: 50 y.o. MRN: [id number]  CC:  Chief Complaint  Patient presents with   Follow-up    3 month follow up    HPI Jessica Garrett is a 51 year old female with a medical history significant for type 2 diabetes mellitus, hypertension, and hyperlipidemia presents for a 98-monthfollow-up of chronic conditions.  Patient states that she has been doing well and is without complaint.  She says that she has not been exercising or following a low-fat, low carbohydrate diet.  She states that she typically eats 1 meal per day and it consist of high carbohydrates and high fat.  She endorses some blurred vision.  She states that it has been 3 months since last eye exam.  She is not followed up with ophthalmology.  She denies polyuria, polydipsia, or polyphagia.  No chest pain, or shortness of breath.  Patient is up-to-date with her vaccinations.  Patient has not scheduled mammogram or colonoscopy.     Past Medical History:  Diagnosis Date   Anemia    only with pregnancy   GERD (gastroesophageal reflux disease)    Headache    " I just go to bed when I have a headache"   Hypertension     Past Surgical History:  Procedure Laterality Date   CHOLECYSTECTOMY N/A 04/26/2016   Procedure: LAPAROSCOPIC CHOLECYSTECTOMY;  Surgeon: ARalene Ok MD;  [facility]  Service: General;  Laterality: N/A;   [place]    x[place]   [place]    only on the R side , pt. reports that she was a wake for the procedure     Family History  Problem Relation Age of Onset   Heart disease Mother    Hypertension Father    Diabetes Brother    Hypertension Brother    Diabetes Maternal Grandmother     Social History   Socioeconomic History   Marital status: Single    Spouse name: Not on file   Number of  children: Not on file   Years of education: Not on file   Highest education level: Not on file  Occupational History   Not on file  Tobacco Use   Smoking status: Never   Smokeless tobacco: Never  Vaping Use   Vaping Use: Never used  Substance and Sexual Activity   Alcohol use: No    Comment: quit 03/2016   Drug use: No   Sexual activity: Yes    Birth control/protection: None  Other Topics Concern   Not on file  Social History Narrative   Not on file   Social Determinants of Health   Financial Resource Strain: Not on file  Food Insecurity: Not on file  Transportation Needs: Not on file  Physical Activity: Not on file  Stress: Not on file  Social Connections: Not on file  Intimate Partner Violence: Not on file    Outpatient Medications Prior to Visit  Medication Sig Dispense Refill   amLODipine (NORVASC) 10 MG tablet Take 1 tablet (10 mg total) by mouth daily. 90 tablet 1   atorvastatin (LIPITOR) 20 MG tablet Take 1 tablet (20 mg total) by mouth daily. 90 tablet 1   BLACK COHOSH EXTRACT PO Take by mouth. Taking 1 tablet once a day  Blood Glucose Monitoring Suppl (TRUE METRIX GO GLUCOSE METER) w/Device KIT 1 each by Does not apply route 2 (two) times daily. 1 kit 0   cetirizine (ZYRTEC) 10 MG tablet Take 1 tablet (10 mg total) by mouth daily. 30 tablet 11   glucose blood (TRUE METRIX BLOOD GLUCOSE TEST) test strip USE AS INSTRUCTED 100 each 12   glucose blood test strip Use as instructed 100 each 12   ibuprofen (ADVIL,MOTRIN) 200 MG tablet Take 400 mg by mouth every 6 (six) hours as needed for mild pain.     Lancets Ultra Fine MISC 1 each by Does not apply route 2 (two) times daily. 100 each 6   TRUEplus Lancets 28G MISC USE AS DIRECTED TWICE DAILY 100 each 6   metFORMIN (GLUCOPHAGE) 500 MG tablet Take 1 tablet (500 mg total) by mouth 2 (two) times daily with a meal. 180 tablet 2   gabapentin (NEURONTIN) 100 MG capsule Take 1 capsule (100 mg total) by mouth 3 (three)  times daily. (Patient not taking: Reported on 12/08/2020) 90 capsule 1   sertraline (ZOLOFT) 50 MG tablet Take 1 tablet (50 mg total) by mouth daily. (Patient not taking: Reported on 12/08/2020) 90 tablet 1   doxycycline (VIBRAMYCIN) 100 MG capsule Take 1 capsule (100 mg total) by mouth 2 (two) times daily. 20 capsule 0   fluticasone (FLONASE) 50 MCG/ACT nasal spray Place 2 sprays into both nostrils daily. 16 g 6   triamcinolone cream (KENALOG) 0.1 % Apply 1 application topically 2 (two) times daily. Apply to affected areas until rash resolves. 454 g 0   No facility-administered medications prior to visit.    No Known Allergies  ROS Review of Systems  Constitutional: Negative.   HENT: Negative.    Eyes:  Positive for visual disturbance.  Respiratory: Negative.    Cardiovascular: Negative.   Gastrointestinal: Negative.   Endocrine: Negative for polydipsia and polyphagia.  Genitourinary: Negative.   Musculoskeletal: Negative.   Skin: Negative.   Neurological: Negative.   Psychiatric/Behavioral: Negative.       Objective:    Physical Exam Constitutional:      Appearance: Normal appearance.  Eyes:     Pupils: Pupils are equal, round, and reactive to light.  Cardiovascular:     Rate and Rhythm: Normal rate and regular rhythm.     Pulses: Normal pulses.     Heart sounds: Normal heart sounds.  Pulmonary:     Effort: Pulmonary effort is normal.     Breath sounds: Normal breath sounds.  Abdominal:     General: Abdomen is flat. Bowel sounds are normal.  Musculoskeletal:        General: Normal range of motion.  Skin:    General: Skin is warm.  Neurological:     General: No focal deficit present.     Mental Status: She is alert. Mental status is at baseline.  Psychiatric:        Mood and Affect: Mood normal.        Thought Content: Thought content normal.        Judgment: Judgment normal.    BP 134/69 (BP Location: Left Arm, Patient Position: Sitting)   Pulse 70   Temp  97.8 F (36.6 C)   Ht 5' 4.5" (1.638 m)   Wt 150 lb 0.2 oz (68 kg)   SpO2 98%   BMI 25.35 kg/m  Wt Readings from Last 3 Encounters:  12/08/20 150 lb 0.2 oz (68 kg)  07/21/20 150 lb  12.8 oz (68.4 kg)  04/21/20 153 lb 3.2 oz (69.5 kg)     Health Maintenance Due  Topic Date Due   URINE MICROALBUMIN  Never done   Hepatitis C Screening  Never done   COLONOSCOPY (Pts 45-51yr Insurance coverage will need to be confirmed)  Never done   MAMMOGRAM  Never done   Zoster Vaccines- Shingrix (1 of 2) Never done   PAP SMEAR-Modifier  05/31/2019   COVID-19 Vaccine (2 - Booster for Janssen series) 09/18/2019    There are no preventive care reminders to display for this patient.  Lab Results  Component Value Date   TSH 1.500 05/01/2017   Lab Results  Component Value Date   WBC 7.2 01/15/2020   HGB 15.6 01/15/2020   HCT 48.5 (H) 01/15/2020   MCV 96 01/15/2020   PLT 266 01/15/2020   Lab Results  Component Value Date   NA 138 04/21/2020   K 4.5 04/21/2020   CO2 24 04/21/2020   GLUCOSE 182 (H) 04/21/2020   BUN 13 04/21/2020   CREATININE 0.78 04/21/2020   BILITOT 0.4 04/21/2020   ALKPHOS 89 04/21/2020   AST 17 04/21/2020   ALT 29 04/21/2020   PROT 8.1 04/21/2020   ALBUMIN 4.8 04/21/2020   CALCIUM 10.3 (H) 04/21/2020   ANIONGAP 9 04/20/2016   Lab Results  Component Value Date   CHOL 229 (H) 04/21/2020   Lab Results  Component Value Date   HDL 66 04/21/2020   Lab Results  Component Value Date   LDLCALC 142 (H) 04/21/2020   Lab Results  Component Value Date   TRIG 120 04/21/2020   Lab Results  Component Value Date   CHOLHDL 3.5 04/21/2020   Lab Results  Component Value Date   HGBA1C 8.3 (A) 12/08/2020   HGBA1C 8.3 12/08/2020   HGBA1C 8.3 (A) 12/08/2020   HGBA1C 8.3 (A) 12/08/2020      Assessment & Plan:   Problem List Items Addressed This Visit       Cardiovascular and Mediastinum   Essential hypertension - Primary   Relevant Orders   Urinalysis  Dipstick (Completed)   Other Visit Diagnoses     New onset type 2 diabetes mellitus ([place]       Relevant Medications   metFORMIN (GLUCOPHAGE) 1000 MG tablet   Other Relevant Orders   Urinalysis Dipstick (Completed)   HgB A1c (Completed)   Glucose (CBG) (Completed)       Meds ordered this encounter  Medications   metFORMIN (GLUCOPHAGE) 1000 MG tablet    Sig: Take 1 tablet (1,000 mg total) by mouth 2 (two) times daily with a meal.    Dispense:  180 tablet    Refill:  2    Order Specific Question:   Supervising Provider    Answer:   JAngelica ChessmanE [[id number]   1. Essential hypertension - Continue medication, monitor blood pressure at home. Continue DASH diet.  Reminder to go to the ER if any CP, SOB, nausea, dizziness, severe HA, changes vision/speech, left arm numbness and tingling and jaw pain.   - Urinalysis Dipstick - Basic Metabolic Panel  2. New onset type 2 diabetes mellitus ([place] Patient's hemoglobin A1c is increased to 8.3.  Discussed the importance of following a low-carb, low-fat diet divided over small meals throughout the day in order to achieve positive outcomes. Also, increase metformin to 1000 mg twice daily.  We will recheck A1c in 3 months. - Urinalysis Dipstick - HgB A1c -  Glucose (CBG) - metFORMIN (GLUCOPHAGE) 1000 MG tablet; Take 1 tablet (1,000 mg total) by mouth 2 (two) times daily with a meal.  Dispense: 180 tablet; Refill: 2 - Microalbumin/Creatinine Ratio, Urine - Basic Metabolic Panel  3. Need for hepatitis C screening test  - Hepatitis C Antibody  4. Colon cancer screening  - Ambulatory referral to Gastroenterology  5. Screening mammogram for breast cancer  - MM Digital Screening; Future   Follow-up: Return in about 3 months (around 03/10/2021) for diabetes, hypertension, gynecological examination.   Donia Pounds  APRN, MSN, FNP-C [facility] [address] [place], [place]  [phone]

## 2020-12-09 LAB — BASIC METABOLIC PANEL
BUN/Creatinine Ratio: 24 — ABNORMAL HIGH (ref 9–23)
BUN: 16 mg/dL (ref 6–24)
CO2: 22 mmol/L (ref 20–29)
Calcium: 10.2 mg/dL (ref 8.7–10.2)
Chloride: 100 mmol/L (ref 96–106)
Creatinine, Ser: 0.67 mg/dL (ref 0.57–1.00)
Glucose: 162 mg/dL — ABNORMAL HIGH (ref 65–99)
Potassium: 4.9 mmol/L (ref 3.5–5.2)
Sodium: 139 mmol/L (ref 134–144)
eGFR: 105 mL/min/{1.73_m2} (ref >59)

## 2020-12-09 LAB — HEPATITIS C ANTIBODY: Hep C Virus Ab: <0.1 s/co ratio (ref 0.0–0.9)

## 2020-12-10 ENCOUNTER — Telehealth: Payer: Self-pay | Admitting: Family Medicine

## 2020-12-10 NOTE — Telephone Encounter (Signed)
Patient notified of results, verbally understood. No additional questions or concerns.  

## 2020-12-10 NOTE — Telephone Encounter (Signed)
Jessica Garrett is a 50 year old female with a medical history significant for type 2 diabetes mellitus, hyperlipidemia, and hypertension that presented for follow-up of chronic conditions. Reviewed all laboratory values, within normal limits.  Please advise patient to follow-up carbohydrate modified, low-fat diet divided over small meals throughout the day as discussed.  Also defer to written information.  Follow-up in office as scheduled.  Nolon Nations  APRN, MSN, FNP-C [facility] [address] [place], [place] [address] [phone]

## 2021-01-25 ENCOUNTER — Other Ambulatory Visit: Payer: Self-pay | Admitting: Family Medicine

## 2021-01-25 DIAGNOSIS — F341 Dysthymic disorder: Secondary | ICD-10-CM

## 2021-01-25 DIAGNOSIS — E1169 Type 2 diabetes mellitus with other specified complication: Secondary | ICD-10-CM

## 2021-01-25 DIAGNOSIS — I10 Essential (primary) hypertension: Secondary | ICD-10-CM

## 2021-01-25 DIAGNOSIS — F411 Generalized anxiety disorder: Secondary | ICD-10-CM

## 2021-03-09 ENCOUNTER — Ambulatory Visit: Payer: Self-pay | Admitting: Family Medicine

## 2021-04-21 ENCOUNTER — Other Ambulatory Visit: Payer: Self-pay | Admitting: Family Medicine

## 2021-04-21 ENCOUNTER — Ambulatory Visit (INDEPENDENT_AMBULATORY_CARE_PROVIDER_SITE_OTHER): Payer: Self-pay | Admitting: Nurse Practitioner

## 2021-04-21 ENCOUNTER — Encounter: Payer: Self-pay | Admitting: Nurse Practitioner

## 2021-04-21 ENCOUNTER — Other Ambulatory Visit: Payer: Self-pay

## 2021-04-21 VITALS — BP 173/89 | HR 98 | Temp 97.7°F | Ht 64.5 in | Wt 147.1 lb

## 2021-04-21 DIAGNOSIS — F341 Dysthymic disorder: Secondary | ICD-10-CM

## 2021-04-21 DIAGNOSIS — E119 Type 2 diabetes mellitus without complications: Secondary | ICD-10-CM

## 2021-04-21 DIAGNOSIS — F411 Generalized anxiety disorder: Secondary | ICD-10-CM

## 2021-04-21 DIAGNOSIS — G6289 Other specified polyneuropathies: Secondary | ICD-10-CM

## 2021-04-21 DIAGNOSIS — I10 Essential (primary) hypertension: Secondary | ICD-10-CM

## 2021-04-21 LAB — POCT GLYCOSYLATED HEMOGLOBIN (HGB A1C)
HbA1c POC (<> result, manual entry): 7.7 % (ref 4.0–5.6)
HbA1c, POC (controlled diabetic range): 7.7 % — AB (ref 0.0–7.0)
HbA1c, POC (prediabetic range): 7.7 % — AB (ref 5.7–6.4)
Hemoglobin A1C: 7.7 % — AB (ref 4.0–5.6)

## 2021-04-21 NOTE — Patient Instructions (Signed)
You were seen today in the [facility] for pain in the BLE. Please Maintain upcoming follow up. Please begin taking previously prescribed medications.

## 2021-04-21 NOTE — Progress Notes (Signed)
[facility] [place], [place]  [address] Phone:  [phone]   Fax:  [phone] Subjective:   Patient ID: Jessica Garrett, female    DOB: March 31, 1971, 50 y.o.   MRN: [id number]  Chief Complaint  Patient presents with   Follow-up    Pt stated she has pain in RT heel pain radiating up and down her legs and arm. Pt has been feeling like this for 2wks.pt has been taking over the counter pain meds   HPI Jessica Garrett 50 y.o. female  has a past medical history of Anemia, GERD (gastroesophageal reflux disease), Headache, and Hypertension. [facility] 3 wks.  Describes pain as sharp and radiating from bilateral feet to knees. Endorses some numbness in toes. Currently rates pain 2022/09/29 and more pronounced in the right foot. Denies any recent trauma or injury. Has been taking OTC medications with no relief in symptoms.   When questioned about non compliance to medications, patient states," I just haven't been taking them." Indicates that she has medications at home. Has been monitoring her diet, does not eat a lot due to decreased appetite. Denies any other complaints today.   Denies any fatigue, chest pain, shortness of breath, HA or dizziness. Denies any blurred vision.  Past Medical History:  Diagnosis Date   Anemia    only with pregnancy   GERD (gastroesophageal reflux disease)    Headache    " I just go to bed when I have a headache"   Hypertension     Past Surgical History:  Procedure Laterality Date   CHOLECYSTECTOMY N/A 04/26/2016   Procedure: LAPAROSCOPIC CHOLECYSTECTOMY;  Surgeon: Ralene Ok, MD;  Location: [place];  Service: General;  Laterality: N/A;   [place]     x3   [place]     only on the R side , pt. reports that she was a wake for the procedure     Family History  Problem Relation Age of Onset   Heart disease Mother    Hypertension Father    Diabetes Brother     Hypertension Brother    Diabetes Maternal Grandmother     Social History   Socioeconomic History   Marital status: Single    Spouse name: Not on file   Number of children: Not on file   Years of education: Not on file   Highest education level: Not on file  Occupational History   Not on file  Tobacco Use   Smoking status: Never   Smokeless tobacco: Never  Vaping Use   Vaping Use: Never used  Substance and Sexual Activity   Alcohol use: No    Comment: quit 03/2016   Drug use: No   Sexual activity: Yes    Birth control/protection: None  Other Topics Concern   Not on file  Social History Narrative   Pt stated she is a social drinker   Social Determinants of Radio broadcast assistant Strain: Not on file  Food Insecurity: Not on file  Transportation Needs: Not on file  Physical Activity: Not on file  Stress: Not on file  Social Connections: Not on file  Intimate Partner Violence: Not on file    Outpatient Medications Prior to Visit  Medication Sig Dispense Refill   amLODipine (NORVASC) 10 MG tablet Take 1 tablet by mouth once daily (Patient not taking: Reported on 04/21/2021) 90 tablet 0  atorvastatin (LIPITOR) 20 MG tablet Take 1 tablet by mouth once daily (Patient not taking: Reported on 04/21/2021) 90 tablet 0   BLACK COHOSH EXTRACT PO Take by mouth. Taking 1 tablet once a day (Patient not taking: Reported on 04/21/2021)     Blood Glucose Monitoring Suppl (TRUE METRIX GO GLUCOSE METER) w/Device KIT 1 each by Does not apply route 2 (two) times daily. (Patient not taking: Reported on 04/21/2021) 1 kit 0   cetirizine (ZYRTEC) 10 MG tablet Take 1 tablet (10 mg total) by mouth daily. (Patient not taking: Reported on 04/21/2021) 30 tablet 11   gabapentin (NEURONTIN) 100 MG capsule Take 1 capsule (100 mg total) by mouth 3 (three) times daily. (Patient not taking: Reported on 12/08/2020) 90 capsule 1   glucose blood test strip Use as instructed (Patient not taking: Reported on  04/21/2021) 100 each 12   ibuprofen (ADVIL,MOTRIN) 200 MG tablet Take 400 mg by mouth every 6 (six) hours as needed for mild pain. (Patient not taking: Reported on 04/21/2021)     Lancets Ultra Fine MISC 1 each by Does not apply route 2 (two) times daily. (Patient not taking: Reported on 04/21/2021) 100 each 6   metFORMIN (GLUCOPHAGE) 1000 MG tablet Take 1 tablet (1,000 mg total) by mouth 2 (two) times daily with a meal. (Patient not taking: Reported on 04/21/2021) 180 tablet 2   sertraline (ZOLOFT) 50 MG tablet Take 1 tablet by mouth once daily (Patient not taking: Reported on 04/21/2021) 90 tablet 0   No facility-administered medications prior to visit.    No Known Allergies  Review of Systems  Constitutional:  Negative for chills, fever and malaise/fatigue.  Respiratory:  Negative for cough and shortness of breath.   Cardiovascular:  Negative for chest pain, palpitations and leg swelling.  Gastrointestinal:  Negative for abdominal pain, blood in stool, constipation, diarrhea, nausea and vomiting.  Musculoskeletal:        See HPI  Skin: Negative.   Neurological:  Positive for tingling and sensory change. Negative for dizziness, tremors, speech change, focal weakness, seizures, loss of consciousness, weakness and headaches.  Psychiatric/Behavioral:  Negative for depression. The patient is not nervous/anxious.   All other systems reviewed and are negative.     Objective:    Physical Exam Vitals reviewed.  Constitutional:      General: She is not in acute distress.    Appearance: Normal appearance. She is normal weight.  HENT:     Head: Normocephalic.  Cardiovascular:     Rate and Rhythm: Normal rate and regular rhythm.     Pulses: Normal pulses.     Heart sounds: Normal heart sounds.     Comments: No obvious peripheral edema Pulmonary:     Effort: Pulmonary effort is normal.     Breath sounds: Normal breath sounds.  Musculoskeletal:        General: No swelling, tenderness,  deformity or signs of injury. Normal range of motion.     Right lower leg: No edema.     Left lower leg: No edema.  Skin:    General: Skin is warm and dry.     Capillary Refill: Capillary refill takes less than 2 seconds.  Neurological:     General: No focal deficit present.     Mental Status: She is alert and oriented to person, place, and time.     Cranial Nerves: No cranial nerve deficit.     Sensory: Sensory deficit present.     Motor: No weakness.  Coordination: Coordination normal.     Gait: Gait normal.     Deep Tendon Reflexes: Reflexes normal.     Comments: Some decreased sensation in pedal portion of right foot  Psychiatric:        Mood and Affect: Mood normal.        Behavior: Behavior normal.        Thought Content: Thought content normal.        Judgment: Judgment normal.    BP (!) 173/89 (BP Location: Right Arm, Patient Position: Sitting)   Pulse 98   Temp 97.7 F (36.5 C)   Ht 5' 4.5" (1.638 m)   Wt 147 lb 2 oz (66.7 kg)   SpO2 100%   BMI 24.86 kg/m  Wt Readings from Last 3 Encounters:  04/21/21 147 lb 2 oz (66.7 kg)  12/08/20 150 lb 0.2 oz (68 kg)  07/21/20 150 lb 12.8 oz (68.4 kg)    Immunization History  Administered Date(s) Administered   Janssen (J&J) SARS-COV-2 Vaccination 07/24/2019   Tdap 04/11/2016    Diabetic Foot Exam - Simple   No data filed     Lab Results  Component Value Date   TSH 1.500 05/01/2017   Lab Results  Component Value Date   WBC 7.2 01/15/2020   HGB 15.6 01/15/2020   HCT 48.5 (H) 01/15/2020   MCV 96 01/15/2020   PLT 266 01/15/2020   Lab Results  Component Value Date   NA 139 12/08/2020   K 4.9 12/08/2020   CO2 22 12/08/2020   GLUCOSE 162 (H) 12/08/2020   BUN 16 12/08/2020   CREATININE 0.67 12/08/2020   BILITOT 0.4 04/21/2020   ALKPHOS 89 04/21/2020   AST 17 04/21/2020   ALT 29 04/21/2020   PROT 8.1 04/21/2020   ALBUMIN 4.8 04/21/2020   CALCIUM 10.2 12/08/2020   ANIONGAP 9 04/20/2016   EGFR 105  12/08/2020   Lab Results  Component Value Date   CHOL 229 (H) 04/21/2020   CHOL 183 08/03/2018   CHOL 200 (H) 02/02/2018   Lab Results  Component Value Date   HDL 66 04/21/2020   HDL 47 08/03/2018   HDL 56 02/02/2018   Lab Results  Component Value Date   LDLCALC 142 (H) 04/21/2020   LDLCALC 114 (H) 08/03/2018   LDLCALC 130 (H) 02/02/2018   Lab Results  Component Value Date   TRIG 120 04/21/2020   TRIG 108 08/03/2018   TRIG 69 02/02/2018   Lab Results  Component Value Date   CHOLHDL 3.5 04/21/2020   CHOLHDL 3.9 08/03/2018   CHOLHDL 3.6 02/02/2018   Lab Results  Component Value Date   HGBA1C 7.7 (A) 04/21/2021   HGBA1C 7.7 04/21/2021   HGBA1C 7.7 (A) 04/21/2021   HGBA1C 7.7 (A) 04/21/2021       Assessment & Plan:   Problem List Items Addressed This Visit   None Visit Diagnoses     New onset type 2 diabetes mellitus ([place])    -  Primary   Relevant Orders   POCT glycosylated hemoglobin (Hb A1C) (Completed): 7.7, improved since previous visit, but not at goal   Other polyneuropathy         Encouraged compliance with prescribed medication(s), including gabapentin and metformin      Symptoms consistent with neuropathy, likely related to patient non compliance to medications      Encouraged continued diet efforts at home      Discussed treatment plan  with patient, demonstrated understanding and  agreed to take medications   Follow up in 1-2 wks as needed, otherwise maintain upcoming follow up with PCP    I am having Shaddai Lovan maintain her cetirizine, ibuprofen, BLACK COHOSH EXTRACT PO, True Metrix Go Glucose Meter, glucose blood, Lancets Ultra Fine, gabapentin, metFORMIN, atorvastatin, amLODipine, and sertraline.  No orders of the defined types were placed in this encounter.    Teena Dunk, NP

## 2021-05-05 ENCOUNTER — Encounter: Payer: Self-pay | Admitting: Gastroenterology

## 2021-06-20 ENCOUNTER — Emergency Department (HOSPITAL_COMMUNITY)
Admission: EM | Admit: 2021-06-20 | Discharge: 2021-06-20 | Disposition: A | Discharge status: Home / Self Care | Payer: BLUE CROSS/BLUE SHIELD | Attending: Emergency Medicine | Admitting: Emergency Medicine

## 2021-06-20 ENCOUNTER — Other Ambulatory Visit: Payer: Self-pay

## 2021-06-20 ENCOUNTER — Encounter (HOSPITAL_COMMUNITY): Payer: Self-pay

## 2021-06-20 DIAGNOSIS — R0789 Other chest pain: Secondary | ICD-10-CM | POA: Insufficient documentation

## 2021-06-20 DIAGNOSIS — Z79899 Other long term (current) drug therapy: Secondary | ICD-10-CM | POA: Diagnosis not present

## 2021-06-20 DIAGNOSIS — X500XXA Overexertion from strenuous movement or load, initial encounter: Secondary | ICD-10-CM | POA: Diagnosis not present

## 2021-06-20 DIAGNOSIS — S46812A Strain of other muscles, fascia and tendons at shoulder and upper arm level, left arm, initial encounter: Secondary | ICD-10-CM | POA: Diagnosis not present

## 2021-06-20 DIAGNOSIS — Z7984 Long term (current) use of oral hypoglycemic drugs: Secondary | ICD-10-CM | POA: Diagnosis not present

## 2021-06-20 DIAGNOSIS — M549 Dorsalgia, unspecified: Secondary | ICD-10-CM | POA: Insufficient documentation

## 2021-06-20 DIAGNOSIS — S4992XA Unspecified injury of left shoulder and upper arm, initial encounter: Secondary | ICD-10-CM | POA: Diagnosis present

## 2021-06-20 MED ORDER — KETOROLAC TROMETHAMINE 15 MG/ML IJ SOLN
15.0000 mg | Freq: Once | INTRAMUSCULAR | Status: AC
  Administered 2021-06-20: 15 mg via INTRAMUSCULAR
  Filled 2021-06-20: qty 1

## 2021-06-20 MED ORDER — METHOCARBAMOL 500 MG PO TABS: 500.0000 mg | ORAL_TABLET | Freq: Two times a day (BID) | ORAL | 0 refills | Status: DC

## 2021-06-20 NOTE — ED Provider Notes (Signed)
[facility] Provider Note   CSN: [id number] Arrival date & time: 06/20/21  1748     History  Chief Complaint  Patient presents with   Back Pain   arm numbness    Jessica Garrett is a 51 y.o. female left upper back pain onset 3 weeks acutely worsening x2 weeks.  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  She notes that she had decreased sensation to her left arm.  She has not tried any medications for her symptoms. Denies chest pain, shortness of breath, fever, chills, color change, wound, swelling.   The history is provided by the patient. No language interpreter was used.      Home Medications Prior to Admission medications   Medication Sig Start Date End Date Taking? Authorizing Provider  methocarbamol (ROBAXIN) 500 MG tablet Take 1 tablet (500 mg total) by mouth 2 (two) times daily. 06/20/21  Yes Maurie Olesen A, PA-C  amLODipine (NORVASC) 10 MG tablet Take 1 tablet by mouth once daily Patient not taking: Reported on 04/21/2021 04/21/21   Dorena Dew, FNP  atorvastatin (LIPITOR) 20 MG tablet Take 1 tablet by mouth once daily Patient not taking: Reported on 04/21/2021 01/26/21   Dorena Dew, FNP  BLACK COHOSH EXTRACT PO Take by mouth. Taking 1 tablet once a day Patient not taking: Reported on 04/21/2021    [provider]  Blood Glucose Monitoring Suppl (TRUE METRIX GO GLUCOSE METER) w/Device KIT 1 each by Does not apply route 2 (two) times daily. Patient not taking: Reported on 04/21/2021 01/15/20   Dorena Dew, FNP  cetirizine (ZYRTEC) 10 MG tablet Take 1 tablet (10 mg total) by mouth daily. Patient not taking: Reported on 04/21/2021 07/20/16   Dorena Dew, FNP  gabapentin (NEURONTIN) 100 MG capsule Take 1 capsule (100 mg total) by mouth 3 (three) times daily. Patient not taking: Reported on 12/08/2020 01/16/20   Dorena Dew, FNP  glucose blood test strip Use as instructed Patient not taking: Reported on  04/21/2021 01/15/20   Dorena Dew, FNP  ibuprofen (ADVIL,MOTRIN) 200 MG tablet Take 400 mg by mouth every 6 (six) hours as needed for mild pain. Patient not taking: Reported on 04/21/2021    [provider]  Lancets Ultra Fine MISC 1 each by Does not apply route 2 (two) times daily. Patient not taking: Reported on 04/21/2021 01/15/20   Dorena Dew, FNP  metFORMIN (GLUCOPHAGE) 1000 MG tablet Take 1 tablet (1,000 mg total) by mouth 2 (two) times daily with a meal. Patient not taking: Reported on 04/21/2021 12/08/20   Dorena Dew, FNP  sertraline (ZOLOFT) 50 MG tablet Take 1 tablet by mouth once daily Patient not taking: Reported on 04/21/2021 04/21/21   Dorena Dew, FNP      Allergies    Patient has no known allergies.    Review of Systems   Review of Systems  Constitutional:  Negative for chills and fever.  Respiratory:  Negative for shortness of breath.   Cardiovascular:  Negative for chest pain.  Musculoskeletal:  Positive for arthralgias and back pain. Negative for joint swelling.  Skin:  Negative for rash.  All other systems reviewed and are negative.  Physical Exam Updated Vital Signs BP (!) 141/89    Pulse 72    Temp 98.3 F (36.8 C) (Oral)    Resp 16    Ht _0  (1.626 m)    Wt 65.8 kg  SpO2 98%    BMI 24.89 kg/m  Physical Exam Vitals and nursing note reviewed.  Constitutional:      General: She is not in acute distress.    Appearance: Normal appearance.  Eyes:     General: No scleral icterus.    Extraocular Movements: Extraocular movements intact.  Cardiovascular:     Rate and Rhythm: Normal rate.  Pulmonary:     Effort: Pulmonary effort is normal. No respiratory distress.  Musculoskeletal:     Right shoulder: Normal.     Left shoulder: Tenderness present. No swelling, deformity, effusion, laceration, bony tenderness or crepitus. Normal strength.     Cervical back: Neck supple.     Comments: Mild tenderness to palpation to left trapezius,  left chest wall. No obvious deformity, effusion, erythema, or swelling.  Full active range of motion of left shoulder. No tenderness to palpation to C, T, L, S spine. Strength and sensation intact to bilateral upper extremities.  Grip strength 5/5 bilaterally.  Radial pulses intact.  Skin:    General: Skin is warm and dry.     Findings: No bruising, erythema or rash.  Neurological:     Mental Status: She is alert.  Psychiatric:        Behavior: Behavior normal.    ED Results / Procedures / Treatments   Labs (all labs ordered are listed, but only abnormal results are displayed) Labs Reviewed - No data to display  EKG None  Radiology No results found.  Procedures Procedures    Medications Ordered in ED Medications  ketorolac (TORADOL) 15 MG/ML injection 15 mg (15 mg Intramuscular Given 06/20/21 1911)    ED Course/ Medical Decision Making/ A&P Clinical Course as of 06/20/21 1959  Sun Jun 20, 2021  1846 Shared decision making with patient, patient would like to forego imaging at this time. [SB]  1856 Discussed discharge treatment plan. Pt agreeable at this time. Pt appears safe for discharge. [SB]    Clinical Course User Index [SB] Zaydon Kinser A, PA-C                           Medical Decision Making Risk Prescription drug management.   Patient with left shoulder pain onset 3 weeks.  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  Denies chest pain, shortness of breath. On exam, patient with mild tenderness to palpation to left trapezius and left chest wall. No edema, erythema, effusion. Sensation and pulses intact bilaterally to upper extremities.  Grip strength 5/5 bilaterally.  Vital signs stable, patient afebrile, not tachycardic or hypoxic. Strength intact bilaterally to upper extremities.  Patient given Toradol IM for pain prior to discharge while in the emergency department.  Differential diagnosis includes muscle spasm, costochondritis, fracture,  dislocation.  Shared decision making with patient regarding utility of imaging in the ED.  Patient notes that she would like to forego imaging and continue with Toradol IM and supportive care measures at home.  Patient presentation suspicious for strain of left trapezius muscle and chest wall pain.  Doubt fracture, dislocation, septic arthritis at this time.  Provided with warm compress and given Toradol injection prior to discharge.  Prescription sent for Robaxin.  Discussed with patient not to drive or operate heavy machinery while taking this medication.  Information provided for on-call orthopedist to schedule follow-up appointment.  Supportive care measures and strict return precautions discussed with patient at bedside. Patient acknowledges and voices understanding.  Patient appears safe for discharge at this time.  Follow-up as indicated in discharge paperwork.  Final Clinical Impression(s) / ED Diagnoses Final diagnoses:  Strain of left trapezius muscle, initial encounter  Chest wall pain    Rx / DC Orders ED Discharge Orders          Ordered    methocarbamol (ROBAXIN) 500 MG tablet  2 times daily        06/20/21 [id number], Keilon Ressel A, PA-C 06/20/21 1959    Lacretia Leigh, MD 06/20/21 2200

## 2021-06-20 NOTE — Discharge Instructions (Addendum)
It was a pleasure taking care of you!   You will be sent a prescription for Robaxin, take as prescribed.  Do not drive or operate heavy machinery with this medication.  You may take over the counter 600 mg Ibuprofen every 6 hours or 500 mg Tylenol every 6 hours as directed for no more than 7 days.  You may apply ice or heat to affected area for up to 15 minutes at a time. Ensure to place a barrier between your skin and the ice/heat. Return to the Emergency Department if you are experiencing increasing/worsening pain, swelling, color change, fever, or worsening symptoms.

## 2021-06-20 NOTE — ED Triage Notes (Addendum)
Patient c/o left upper back pain that radiates down into the chest x 3 weeks. Today, the patient reports left arm numbness and tingling that started today. 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

## 2021-06-23 ENCOUNTER — Other Ambulatory Visit: Payer: Self-pay

## 2021-06-23 ENCOUNTER — Ambulatory Visit (AMBULATORY_SURGERY_CENTER): Payer: Self-pay | Admitting: *Deleted

## 2021-06-23 VITALS — Ht 64.0 in | Wt 145.0 lb

## 2021-06-23 DIAGNOSIS — Z1211 Encounter for screening for malignant neoplasm of colon: Secondary | ICD-10-CM

## 2021-06-23 MED ORDER — PLENVU 140 G PO SOLR: 1.0000 | ORAL | 0 refills | Status: DC

## 2021-06-23 NOTE — Progress Notes (Signed)
No egg or soy allergy known to patient  ?No issues known to pt with past sedation with any surgeries or procedures ?Patient denies ever being told they had issues or difficulty with intubation  ?No FH of Malignant Hyperthermia ?Pt is not on diet pills ?Pt is not on  home 02  ?Pt is not on blood thinners  ?Pt denies issues with constipation  ?No A fib or A flutter ? ?Pt is fully vaccinated  for Covid  ? ?Plenvu Coupon to pt in PV today , Code to Pharmacy and  NO PA's for preps discussed with pt In PV today  ?Discussed with pt there will be an out-of-pocket cost for prep and that varies from $0 to 70 +  dollars - pt verbalized understanding  ? ?Due to the COVID-19 pandemic we are asking patients to follow certain guidelines in PV and the [facility]   ?Pt aware of COVID protocols and [facility] guidelines  ? ?PV completed over the phone. Pt verified name, DOB, address and insurance during PV today.  ?Pt mailed instruction packet with copy of consent form to read and not return, and instructions.  ?Pt encouraged to call with questions or issues.  ?If pt has My chart, procedure instructions sent via My Chart   ?

## 2021-07-07 ENCOUNTER — Encounter: Payer: Self-pay | Admitting: Gastroenterology

## 2021-07-20 ENCOUNTER — Other Ambulatory Visit: Payer: Self-pay | Admitting: Family Medicine

## 2021-07-20 DIAGNOSIS — E1169 Type 2 diabetes mellitus with other specified complication: Secondary | ICD-10-CM

## 2021-07-23 ENCOUNTER — Encounter (HOSPITAL_COMMUNITY): Payer: Self-pay

## 2021-07-23 ENCOUNTER — Emergency Department (HOSPITAL_COMMUNITY)
Admission: EM | Admit: 2021-07-23 | Discharge: 2021-07-23 | Disposition: A | Discharge status: Home / Self Care | Payer: BLUE CROSS/BLUE SHIELD | Attending: Emergency Medicine | Admitting: Emergency Medicine

## 2021-07-23 DIAGNOSIS — W268XXA Contact with other sharp object(s), not elsewhere classified, initial encounter: Secondary | ICD-10-CM | POA: Diagnosis not present

## 2021-07-23 DIAGNOSIS — Z79899 Other long term (current) drug therapy: Secondary | ICD-10-CM | POA: Insufficient documentation

## 2021-07-23 DIAGNOSIS — Z7984 Long term (current) use of oral hypoglycemic drugs: Secondary | ICD-10-CM | POA: Diagnosis not present

## 2021-07-23 DIAGNOSIS — I10 Essential (primary) hypertension: Secondary | ICD-10-CM | POA: Diagnosis not present

## 2021-07-23 DIAGNOSIS — E119 Type 2 diabetes mellitus without complications: Secondary | ICD-10-CM | POA: Insufficient documentation

## 2021-07-23 DIAGNOSIS — S61012A Laceration without foreign body of left thumb without damage to nail, initial encounter: Secondary | ICD-10-CM | POA: Insufficient documentation

## 2021-07-23 DIAGNOSIS — S60932A Unspecified superficial injury of left thumb, initial encounter: Secondary | ICD-10-CM | POA: Diagnosis present

## 2021-07-23 DIAGNOSIS — S61412A Laceration without foreign body of left hand, initial encounter: Secondary | ICD-10-CM

## 2021-07-23 MED ORDER — LIDOCAINE-EPINEPHRINE 2 %-1:100000 IJ SOLN
1.7000 mL | Freq: Once | INTRAMUSCULAR | Status: AC
  Administered 2021-07-23: 1.7 mL via INTRADERMAL
  Filled 2021-07-23: qty 1

## 2021-07-23 MED ORDER — BACITRACIN ZINC 500 UNIT/GM EX OINT: TOPICAL_OINTMENT | Freq: Two times a day (BID) | CUTANEOUS | Status: DC

## 2021-07-23 NOTE — ED Notes (Signed)
Cleansing hand laceration.  ?

## 2021-07-23 NOTE — ED Provider Notes (Signed)
?[facility] DEPT ?Provider Note ? ? ?CSN: [id number] ?Arrival date & time: 07/23/21  1426 ? ?  ? ?History ? ?Chief Complaint  ?Patient presents with  ? Laceration  ?  Left hand  ? ? ?Jessica Garrett is a 51 y.o. female who presents with concern for laceration to the base of the left thumb after she slipped while cutting up a cabbage this morning. Occureed around 10 am, but would not stop bleeding prompting her ED presentation.  ? ?Of note, did not take BP meds today.  ? ?I have personally reviewed her medical records. She has hx of HTN, DM2, GERD, anemia. Not anticoagulated.  ? ?HPI ? ?  ? ?Home Medications ?Prior to Admission medications   ?Medication Sig Start Date End Date Taking? Authorizing Provider  ?amLODipine (NORVASC) 10 MG tablet Take 1 tablet by mouth once daily 04/21/21   Dorena Dew, FNP  ?atorvastatin (LIPITOR) 20 MG tablet Take 1 tablet by mouth once daily 07/20/21   Dorena Dew, FNP  ?BLACK COHOSH EXTRACT PO Take by mouth. Taking 1 tablet once a day ?Patient not taking: Reported on 04/21/2021    [provider]  ?Blood Glucose Monitoring Suppl (TRUE METRIX GO GLUCOSE METER) w/Device KIT 1 each by Does not apply route 2 (two) times daily. 01/15/20   Dorena Dew, FNP  ?cetirizine (ZYRTEC) 10 MG tablet Take 1 tablet (10 mg total) by mouth daily. 07/20/16   Dorena Dew, FNP  ?gabapentin (NEURONTIN) 100 MG capsule Take 1 capsule (100 mg total) by mouth 3 (three) times daily. ?Patient not taking: Reported on 12/08/2020 01/16/20   Dorena Dew, FNP  ?glucose blood test strip Use as instructed 01/15/20   Dorena Dew, FNP  ?ibuprofen (ADVIL,MOTRIN) 200 MG tablet Take 400 mg by mouth every 6 (six) hours as needed for mild pain.    [provider]  ?Lancets Ultra Fine MISC 1 each by Does not apply route 2 (two) times daily. 01/15/20   Dorena Dew, FNP  ?metFORMIN (GLUCOPHAGE) 1000 MG tablet Take 1 tablet (1,000 mg total) by mouth 2 (two)  times daily with a meal. 12/08/20   Dorena Dew, FNP  ?methocarbamol (ROBAXIN) 500 MG tablet Take 1 tablet (500 mg total) by mouth 2 (two) times daily. ?Patient not taking: Reported on 06/23/2021 06/20/21   Blue, Soijett A, PA-C  ?PEG-KCl-NaCl-NaSulf-Na Asc-C (PLENVU) 140 g SOLR Take 1 kit by mouth as directed. 06/23/21   Mauri Pole, MD  ?sertraline (ZOLOFT) 50 MG tablet Take 1 tablet by mouth once daily 04/21/21   Dorena Dew, FNP  ?   ? ?Allergies    ?Patient has no known allergies.   ? ?Review of Systems   ?Review of Systems  ?Skin:  Positive for wound.  ? ?Physical Exam ?Updated Vital Signs ?BP (!) 137/116   Pulse 77   Temp 99.2 ?F (37.3 ?C) (Oral)   Resp 18   SpO2 100%  ?Physical Exam ?Vitals and nursing note reviewed.  ?HENT:  ?   Head: Normocephalic and atraumatic.  ?Eyes:  ?   General: No scleral icterus.    ?   Right eye: No discharge.     ?   Left eye: No discharge.  ?   Conjunctiva/sclera: Conjunctivae normal.  ?Pulmonary:  ?   Effort: Pulmonary effort is normal.  ?Musculoskeletal:  ?     Hands: ? ?Skin: ?   General: Skin is warm and dry.  ?  Capillary Refill: Capillary refill takes less than 2 seconds.  ?   Findings: Laceration present.  ?Neurological:  ?   General: No focal deficit present.  ?   Mental Status: She is alert.  ?Psychiatric:     ?   Mood and Affect: Mood normal.  ? ? ?ED Results / Procedures / Treatments   ?Labs ?(all labs ordered are listed, but only abnormal results are displayed) ?Labs Reviewed - No data to display ? ?EKG ?None ? ?Radiology ?No results found. ? ?Procedures ?Marland Kitchen.Laceration Repair ? ?Date/Time: 07/23/2021 3:51 PM ?Performed by: Emeline Darling, PA-C ?Authorized by: Emeline Darling, PA-C  ? ?Consent:  ?  Consent obtained:  Verbal ?  Consent given by:  Patient ?  Risks discussed:  Infection, need for additional repair, pain, poor cosmetic result and poor wound healing ?  Alternatives discussed:  No treatment and delayed treatment ?Universal  protocol:  ?  Procedure explained and questions answered to patient or proxy's satisfaction: yes   ?  Relevant documents present and verified: yes   ?  Test results available: yes   ?  Imaging studies available: yes   ?  Required blood products, implants, devices, and special equipment available: yes   ?  Site/side marked: yes   ?  Immediately prior to procedure, a time out was called: yes   ?  Patient identity confirmed:  Verbally with patient ?Anesthesia:  ?  Anesthesia method:  Local infiltration ?  Local anesthetic:  Lidocaine 2% WITH epi ?Laceration details:  ?  Location:  Hand ?  Hand location:  L palm ?  Length (cm):  1.5 ?Exploration:  ?  Limited defect created (wound extended): no   ?  Hemostasis achieved with:  Direct pressure ?  Wound extent: no foreign bodies/material noted, no tendon damage noted and no underlying fracture noted   ?Treatment:  ?  Area cleansed with:  Betadine and saline ?  Amount of cleaning:  Standard ?  Irrigation solution:  Sterile saline ?Skin repair:  ?  Repair method:  Sutures ?  Suture size:  4-0 ?  Suture material:  Prolene ?  Suture technique:  Simple interrupted ?  Number of sutures:  2 ?Approximation:  ?  Approximation:  Close ?Repair type:  ?  Repair type:  Simple ?Post-procedure details:  ?  Dressing:  Antibiotic ointment and non-adherent dressing ?  Procedure completion:  Tolerated well, no immediate complications  ? ? ?Medications Ordered in ED ?Medications  ?bacitracin ointment ( Topical Given 07/23/21 1553)  ?lidocaine-EPINEPHrine (XYLOCAINE W/EPI) 2 %-1:100000 (with pres) injection 1.7 mL (1.7 mLs Intradermal Given by Other 07/23/21 1534)  ? ? ?ED Course/ Medical Decision Making/ A&P ?  ?                        ?Medical Decision Making ?Risk ?Prescription drug management. ? ? ?Superficial wound, repaired as above. Patient tolerated the procedure well. No further workup warranted in the ED at this time. Jessica Garrett voiced understanding of her medical evaluation and treatment  plan. Each of her questions was answered to her expressed satisfaction. Return precautions were given. Patient is well-appearing, stable, and was discharged in good condition.  ? ?This chart was dictated using voice recognition software, Dragon. Despite the best efforts of this provider to proofread and correct errors, errors may still occur which can change documentation meaning. ? ?Final Clinical Impression(s) / ED Diagnoses ?Final diagnoses:  ?Laceration of left hand, foreign  body presence unspecified, initial encounter  ? ? ?Rx / DC Orders ?ED Discharge Orders   ? ? None  ? ?  ? ? ?  ?Emeline Darling, PA-C ?07/23/21 1557 ? ?  ?Tegeler, Gwenyth Allegra, MD ?07/23/21 1652 ? ?

## 2021-07-23 NOTE — ED Notes (Signed)
Applied bacitracin to left hand and applied bandage. Pt given extra supplies.  ?

## 2021-07-23 NOTE — ED Triage Notes (Addendum)
Pt states she was cutting cabbage with a knife about 10am this morning and cut her left palm/thumb. Bleeding is now controlled. Laceration looks to be superficial.  ? ?5/10 pain  ?A/ox4 ?Ambulatory in triage  ? ?Hx: DM and Hypertension- pt did not take BP meds today.  ?

## 2021-07-23 NOTE — Discharge Instructions (Signed)
Your wound was repaired with 2 suture today. These will need to be removed in 10-14 days. Please follow up with your primary care doctor for further monitoring of your wound to make sure it does not become infected. Dress the wound daily with antibiotic ointment and keep the wound clean and dry.  ? ?Return to the ER with any new severe symptoms.  ?

## 2022-02-22 ENCOUNTER — Encounter: Payer: Self-pay | Admitting: Family Medicine

## 2022-02-22 ENCOUNTER — Ambulatory Visit (INDEPENDENT_AMBULATORY_CARE_PROVIDER_SITE_OTHER): Payer: BLUE CROSS/BLUE SHIELD | Admitting: Family Medicine

## 2022-02-22 VITALS — BP 176/80 | HR 74 | Temp 97.6°F | Ht 64.0 in | Wt 147.2 lb

## 2022-02-22 DIAGNOSIS — F341 Dysthymic disorder: Secondary | ICD-10-CM

## 2022-02-22 DIAGNOSIS — E119 Type 2 diabetes mellitus without complications: Secondary | ICD-10-CM

## 2022-02-22 DIAGNOSIS — Z1211 Encounter for screening for malignant neoplasm of colon: Secondary | ICD-10-CM | POA: Diagnosis not present

## 2022-02-22 DIAGNOSIS — F411 Generalized anxiety disorder: Secondary | ICD-10-CM

## 2022-02-22 DIAGNOSIS — E1169 Type 2 diabetes mellitus with other specified complication: Secondary | ICD-10-CM

## 2022-02-22 DIAGNOSIS — I10 Essential (primary) hypertension: Secondary | ICD-10-CM

## 2022-02-22 DIAGNOSIS — Z1231 Encounter for screening mammogram for malignant neoplasm of breast: Secondary | ICD-10-CM | POA: Diagnosis not present

## 2022-02-22 DIAGNOSIS — E785 Hyperlipidemia, unspecified: Secondary | ICD-10-CM

## 2022-02-22 LAB — POCT GLYCOSYLATED HEMOGLOBIN (HGB A1C): HbA1c, POC (controlled diabetic range): 6.8 % (ref 0.0–7.0)

## 2022-02-22 LAB — POCT URINALYSIS DIP (CLINITEK)
Bilirubin, UA: NEGATIVE
Glucose, UA: NEGATIVE mg/dL
Ketones, POC UA: NEGATIVE mg/dL
Leukocytes, UA: NEGATIVE
Nitrite, UA: NEGATIVE
Spec Grav, UA: >=1.03 — AB (ref 1.010–1.025)
Urobilinogen, UA: 0.2 E.U./dL
pH, UA: 5 (ref 5.0–8.0)

## 2022-02-22 LAB — GLUCOSE, POCT (MANUAL RESULT ENTRY): POC Glucose: 203 mg/dl — AB (ref 70–99)

## 2022-02-22 MED ORDER — ATORVASTATIN CALCIUM 20 MG PO TABS: 20.0000 mg | ORAL_TABLET | Freq: Every day | ORAL | 1 refills | Status: DC

## 2022-02-22 MED ORDER — SERTRALINE HCL 50 MG PO TABS: 50.0000 mg | ORAL_TABLET | Freq: Every day | ORAL | 1 refills | Status: DC

## 2022-02-22 MED ORDER — METFORMIN HCL 1000 MG PO TABS: 1000.0000 mg | ORAL_TABLET | Freq: Two times a day (BID) | ORAL | 2 refills | Status: DC

## 2022-02-22 MED ORDER — AMLODIPINE BESYLATE 10 MG PO TABS: 10.0000 mg | ORAL_TABLET | Freq: Every day | ORAL | 1 refills | Status: DC

## 2022-02-22 NOTE — Patient Instructions (Signed)
Diabetes Mellitus Action Plan Following a diabetes action plan is a way for you to manage your diabetes (diabetes mellitus) symptoms. The plan is color-coded to help you understand what actions you need to take based on any symptoms you are having. If you have symptoms in the red zone, you need medical care right away. If you have symptoms in the yellow zone, you are having problems. If you have symptoms in the green zone, you are doing well. Learning about and understanding diabetes can take time. Follow the plan that you develop with your health care provider. Know the target range for your blood sugar (glucose) level, and review your treatment plan with your health care provider at each visit. The target range for my blood sugar level is __________________________ mg/dL. Red zone Get medical help right away if you have any of the following symptoms: A blood sugar test result that is below 54 mg/dL (3 mmol/L). A blood sugar test result that is at or above 240 mg/dL (13.3 mmol/L) for 2 days in a row. Confusion or trouble thinking clearly. Difficulty breathing. Sickness or a fever for 2 or more days that is not getting better. Moderate or large ketone levels in your urine. Feeling tired or having no energy. If you have any red zone symptoms, do not wait to see if the symptoms will go away. Get medical help right away. Call your local emergency services (911 in the [place] Do not drive yourself to the hospital. If you have severely low blood sugar (severe hypoglycemia) and you cannot eat or drink, you may need glucagon. Make sure a family member or close friend knows how to check your blood sugar and how to give you glucagon. You may need to be treated in a hospital for this condition. Yellow zone If you have any of the following symptoms, your diabetes is not under control and you may need to make some changes: A blood sugar test result that is at or above 240 mg/dL (13.3 mmol/L) for 2 days in a  row. Blood sugar test results that are below 70 mg/dL (3.9 mmol/L). Other symptoms of hypoglycemia, such as: Shaking or feeling light-headed. Confusion or irritability. Feeling hungry. Having a fast heartbeat. If you have any yellow zone symptoms: Treat your hypoglycemia by eating or drinking 15 grams of a rapid-acting carbohydrate. Follow the 15:15 rule: Take 15 grams of a rapid-acting carbohydrate, such as: 1 tube of glucose gel. 4 glucose pills. 4 oz (120 mL) of fruit juice. 4 oz (120 mL) of regular (not diet) soda. Check your blood sugar 15 minutes after you take the carbohydrate. If the repeat blood sugar test is still at or below 70 mg/dL (3.9 mmol/L), take 15 grams of a carbohydrate again. If your blood sugar does not increase above 70 mg/dL (3.9 mmol/L) after 3 tries, get medical help right away. After your blood sugar returns to normal, eat a meal or a snack within 1 hour. Keep taking your daily medicines as told by your health care provider. Check your blood sugar more often than you normally would. Write down your results. Call your health care provider if you have trouble keeping your blood sugar in your target range.  Green zone These signs mean you are doing well and you can continue what you are doing to manage your diabetes: Your blood sugar is within your personal target range. For most people, a blood sugar level before a meal (preprandial) should be 80-130 mg/dL (4.4-7.2 mmol/L). You feel   well, and you are able to do daily activities. If you are in the green zone, continue to manage your diabetes as told by your health care provider. To do this: Eat a healthy diet. Exercise regularly. Check your blood sugar as told by your health care provider. Take your medicines as told by your health care provider.  Where to find more information American Diabetes Association (ADA): diabetes.org Association of Diabetes Care & Education Specialists (ADCES):  diabeteseducator.org Summary Following a diabetes action plan is a way for you to manage your diabetes symptoms. The plan is color-coded to help you understand what actions you need to take based on any symptoms you are having. Follow the plan that you develop with your health care provider. Make sure you know your personal target blood sugar level. Review your treatment plan with your health care provider at each visit. This information is not intended to replace advice given to you by your health care provider. Make sure you discuss any questions you have with your health care provider. Document Revised: 11/07/2019 Document Reviewed: 11/07/2019 Elsevier Patient Education  2023 [employer]  

## 2022-02-23 LAB — CMP AND LIVER
ALT: 14 IU/L (ref 0–32)
AST: 16 IU/L (ref 0–40)
Albumin: 4.5 g/dL (ref 3.8–4.9)
Alkaline Phosphatase: 93 IU/L (ref 44–121)
BUN: 15 mg/dL (ref 6–24)
Bilirubin Total: 0.3 mg/dL (ref 0.0–1.2)
Bilirubin, Direct: 0.11 mg/dL (ref 0.00–0.40)
CO2: 21 mmol/L (ref 20–29)
Calcium: 10.2 mg/dL (ref 8.7–10.2)
Chloride: 103 mmol/L (ref 96–106)
Creatinine, Ser: 0.76 mg/dL (ref 0.57–1.00)
Glucose: 194 mg/dL — ABNORMAL HIGH (ref 70–99)
Potassium: 4.5 mmol/L (ref 3.5–5.2)
Sodium: 139 mmol/L (ref 134–144)
Total Protein: 7.4 g/dL (ref 6.0–8.5)
eGFR: 94 mL/min/{1.73_m2} (ref >59)

## 2022-02-23 LAB — LIPID PANEL
Chol/HDL Ratio: 3 ratio (ref 0.0–4.4)
Cholesterol, Total: 200 mg/dL — ABNORMAL HIGH (ref 100–199)
HDL: 66 mg/dL (ref >39)
LDL Chol Calc (NIH): 123 mg/dL — ABNORMAL HIGH (ref 0–99)
Triglycerides: 62 mg/dL (ref 0–149)
VLDL Cholesterol Cal: 11 mg/dL (ref 5–40)

## 2022-02-23 LAB — MICROALBUMIN / CREATININE URINE RATIO
Creatinine, Urine: 223.8 mg/dL
Microalb/Creat Ratio: 33 mg/g creat — ABNORMAL HIGH (ref 0–29)
Microalbumin, Urine: 73.1 ug/mL

## 2022-03-08 ENCOUNTER — Ambulatory Visit: Payer: Self-pay

## 2022-03-14 ENCOUNTER — Ambulatory Visit: Payer: Self-pay

## 2022-03-26 NOTE — Progress Notes (Signed)
Established Patient Office Visit  Subjective   Patient ID: Jessica Garrett, female    DOB: [birth year]  Age: 51 y.o. MRN: [id number]  Chief Complaint  Patient presents with   Diabetes    Patient would like to start back on medications to help control her diabetes and hypertension.    Jessica Garrett is a 51 year old female with a medical history significant for type 2 DM, hypertension, and hyperlipidemia. Patient has not been taking medications consistently over the past several months. She did not take medication prior to arrival.  She does not check blood pressure at home. She also has not been checking blood glucose at home.   Diabetes She presents for her follow-up diabetic visit. She has type 2 diabetes mellitus. Her disease course has been improving. Pertinent negatives for diabetes include no blurred vision, no chest pain, no polydipsia, no polyphagia and no polyuria.  Hypertension This is a chronic problem. The problem is controlled. Pertinent negatives include no blurred vision or chest pain. Compliance problems include exercise and diet.  There is no history of CAD/MI, heart failure or left ventricular hypertrophy. There is no history of chronic renal disease, coarctation of the aorta, sleep apnea or a thyroid problem.    Patient Active Problem List   Diagnosis Date Noted   Current mild episode of major depressive disorder without prior episode ([place]) 05/19/2020   Right foot pain 09/10/2019   Generalized anxiety disorder 05/01/2017   Chronic right shoulder pain 09/26/2016   Essential hypertension 05/05/2016   Past Medical History:  Diagnosis Date   Anemia    only with pregnancy   Diabetes mellitus without complication (HCC)    GERD (gastroesophageal reflux disease)    Headache    " I just go to bed when I have a headache"   Hypertension    Past Surgical History:  Procedure Laterality Date   CHOLECYSTECTOMY N/A 04/26/2016   Procedure: LAPAROSCOPIC CHOLECYSTECTOMY;   Surgeon: Ralene Ok, MD;  Location: [facility];  Service: General;  Laterality: N/A;   [place]     x3   WISDOM TOOTH EXTRACTION     only on the R side , pt. reports that she was a wake for the procedure    Social History   Tobacco Use   Smoking status: Never   Smokeless tobacco: Never  Vaping Use   Vaping Use: Never used  Substance Use Topics   Alcohol use: Yes    Comment: occasional   Drug use: No   Social History   Socioeconomic History   Marital status: Single    Spouse name: Not on file   Number of children: Not on file   Years of education: Not on file   Highest education level: Not on file  Occupational History   Not on file  Tobacco Use   Smoking status: Never   Smokeless tobacco: Never  Vaping Use   Vaping Use: Never used  Substance and Sexual Activity   Alcohol use: Yes    Comment: occasional   Drug use: No   Sexual activity: Yes    Birth control/protection: None  Other Topics Concern   Not on file  Social History Narrative   Pt stated she is a social drinker   Social Determinants of Radio broadcast assistant Strain: Not on file  Food Insecurity: Not on file  Transportation Needs: Not on file  Physical Activity: Not on file  Stress: Not  on file  Social Connections: Not on file  Intimate Partner Violence: Not on file   Family Status  Relation Name Status   Mother  Deceased   Father  (Not Specified)   Brother  (Not Specified)   MGM  (Not Specified)   Neg Hx  (Not Specified)   Family History  Problem Relation Age of Onset   Heart disease Mother    Colon cancer Father    Hypertension Father    Diabetes Brother    Hypertension Brother    Diabetes Maternal Grandmother    Colon polyps Neg Hx    Esophageal cancer Neg Hx    Rectal cancer Neg Hx    Stomach cancer Neg Hx    No Known Allergies    Review of Systems  Constitutional: Negative.   HENT: Negative.    Eyes: Negative.  Negative for blurred  vision.  Respiratory: Negative.    Cardiovascular: Negative.  Negative for chest pain.  Gastrointestinal: Negative.   Genitourinary: Negative.   Musculoskeletal: Negative.   Skin: Negative.   Neurological: Negative.   Endo/Heme/Allergies: Negative.  Negative for polydipsia and polyphagia.  Psychiatric/Behavioral: Negative.        Objective:     BP (!) 176/80   Pulse 74   Temp 97.6 F (36.4 C) (Temporal)   Ht _0  (1.626 m)   Wt 147 lb 3.2 oz (66.8 kg)   SpO2 98%   BMI 25.27 kg/m  BP Readings from Last 3 Encounters:  02/22/22 (!) 176/80  07/23/21 (!) 156/97  06/20/21 (!) 141/89   Wt Readings from Last 3 Encounters:  02/22/22 147 lb 3.2 oz (66.8 kg)  06/23/21 145 lb (65.8 kg)  06/20/21 145 lb (65.8 kg)      Physical Exam Constitutional:      Appearance: Normal appearance.  Eyes:     Pupils: Pupils are equal, round, and reactive to light.  Cardiovascular:     Rate and Rhythm: Normal rate and regular rhythm.     Pulses: Normal pulses.     Heart sounds: Normal heart sounds.  Pulmonary:     Effort: Pulmonary effort is normal.     Breath sounds: Normal breath sounds.  Abdominal:     General: Abdomen is flat. Bowel sounds are normal.  Neurological:     Mental Status: She is alert.      Results for orders placed or performed in visit on 02/22/22  Microalbumin/Creatinine Ratio, Urine  Result Value Ref Range   Creatinine, Urine 223.8 Not Estab. mg/dL   Microalbumin, Urine 73.1 Not Estab. ug/mL   Microalb/Creat Ratio 33 (H) 0 - 29 mg/g creat  CMP and Liver  Result Value Ref Range   Glucose 194 (H) 70 - 99 mg/dL   BUN 15 6 - 24 mg/dL   Creatinine, Ser 0.76 0.57 - 1.00 mg/dL   eGFR 94 >59 mL/min/1.73   Sodium 139 134 - 144 mmol/L   Potassium 4.5 3.5 - 5.2 mmol/L   Chloride 103 96 - 106 mmol/L   CO2 21 20 - 29 mmol/L   Calcium 10.2 8.7 - 10.2 mg/dL   Total Protein 7.4 6.0 - 8.5 g/dL   Albumin 4.5 3.8 - 4.9 g/dL   Bilirubin Total 0.3 0.0 - 1.2 mg/dL    Bilirubin, Direct 0.11 0.00 - 0.40 mg/dL   Alkaline Phosphatase 93 44 - 121 IU/L   AST 16 0 - 40 IU/L   ALT 14 0 - 32 IU/L  Lipid Panel  Result Value Ref Range   Cholesterol, Total 200 (H) 100 - 199 mg/dL   Triglycerides 62 0 - 149 mg/dL   HDL 66 >39 mg/dL   VLDL Cholesterol Cal 11 5 - 40 mg/dL   LDL Chol Calc (NIH) 123 (H) 0 - 99 mg/dL   Chol/HDL Ratio 3.0 0.0 - 4.4 ratio  POCT glycosylated hemoglobin (Hb A1C)  Result Value Ref Range   Hemoglobin A1C     HbA1c POC (<> result, manual entry)     HbA1c, POC (prediabetic range)     HbA1c, POC (controlled diabetic range) 6.8 0.0 - 7.0 %  POCT glucose (manual entry)  Result Value Ref Range   POC Glucose 203 (A) 70 - 99 mg/dl  POCT URINALYSIS DIP (CLINITEK)  Result Value Ref Range   Color, UA yellow yellow   Clarity, UA clear clear   Glucose, UA negative negative mg/dL   Bilirubin, UA negative negative   Ketones, POC UA negative negative mg/dL   Spec Grav, UA >=1.030 (A) 1.010 - 1.025   Blood, UA moderate (A) negative   pH, UA 5.0 5.0 - 8.0   POC PROTEIN,UA trace negative, trace   Urobilinogen, UA 0.2 0.2 or 1.0 E.U./dL   Nitrite, UA Negative Negative   Leukocytes, UA Negative Negative    Last CBC Lab Results  Component Value Date   WBC 7.2 01/15/2020   HGB 15.6 01/15/2020   HCT 48.5 (H) 01/15/2020   MCV 96 01/15/2020   MCH 30.9 01/15/2020   RDW 12.2 01/15/2020   PLT 266 72/62/0355   Last metabolic panel Lab Results  Component Value Date   GLUCOSE 194 (H) 02/22/2022   NA 139 02/22/2022   K 4.5 02/22/2022   CL 103 02/22/2022   CO2 21 02/22/2022   BUN 15 02/22/2022   CREATININE 0.76 02/22/2022   EGFR 94 02/22/2022   CALCIUM 10.2 02/22/2022   PROT 7.4 02/22/2022   ALBUMIN 4.5 02/22/2022   LABGLOB 3.3 04/21/2020   AGRATIO 1.5 04/21/2020   BILITOT 0.3 02/22/2022   ALKPHOS 93 02/22/2022   AST 16 02/22/2022   ALT 14 02/22/2022   ANIONGAP 9 04/20/2016   Last lipids Lab Results  Component Value Date   CHOL  200 (H) 02/22/2022   HDL 66 02/22/2022   LDLCALC 123 (H) 02/22/2022   TRIG 62 02/22/2022   CHOLHDL 3.0 02/22/2022   Last hemoglobin A1c Lab Results  Component Value Date   HGBA1C 6.8 02/22/2022   Last thyroid functions Lab Results  Component Value Date   TSH 1.500 05/01/2017   Last vitamin D No results found for: "25OHVITD2", "25OHVITD3", "VD25OH" Last vitamin B12 and Folate No results found for: "VITAMINB12", "FOLATE"    The 10-year ASCVD risk score (Arnett DK, et al., 2019) is: 24.5%    Assessment & Plan:   Problem List Items Addressed This Visit       Cardiovascular and Mediastinum   Essential hypertension   Relevant Medications   amLODipine (NORVASC) 10 MG tablet   atorvastatin (LIPITOR) 20 MG tablet     Other   Generalized anxiety disorder   Relevant Medications   sertraline (ZOLOFT) 50 MG tablet   Other Relevant Orders   Ambulatory referral to Psychiatry   Other Visit Diagnoses     Type 2 diabetes mellitus without complication, without long-term current use of insulin (HCC)    -  Primary   Relevant Medications   atorvastatin (LIPITOR) 20 MG tablet   metFORMIN (GLUCOPHAGE) 1000  MG tablet   Other Relevant Orders   Microalbumin/Creatinine Ratio, Urine (Completed)   CMP and Liver (Completed)   New onset type 2 diabetes mellitus (HCC)       Relevant Medications   atorvastatin (LIPITOR) 20 MG tablet   metFORMIN (GLUCOPHAGE) 1000 MG tablet   Other Relevant Orders   POCT glycosylated hemoglobin (Hb A1C) (Completed)   POCT glucose (manual entry) (Completed)   POCT URINALYSIS DIP (CLINITEK) (Completed)   Colon cancer screening       Relevant Orders   Ambulatory referral to Gastroenterology   Encounter for screening mammogram for malignant neoplasm of breast       Relevant Orders   MM 3D SCREEN BREAST BILATERAL   Dysthymia       Relevant Medications   sertraline (ZOLOFT) 50 MG tablet   Hyperlipidemia associated with type 2 diabetes mellitus (HCC)        Relevant Medications   amLODipine (NORVASC) 10 MG tablet   atorvastatin (LIPITOR) 20 MG tablet   metFORMIN (GLUCOPHAGE) 1000 MG tablet   Other Relevant Orders   Lipid Panel (Completed)     1. New onset type 2 diabetes mellitus (HCC)  - POCT glycosylated hemoglobin (Hb A1C) - POCT glucose (manual entry) - POCT URINALYSIS DIP (CLINITEK) - metFORMIN (GLUCOPHAGE) 1000 MG tablet; Take 1 tablet (1,000 mg total) by mouth 2 (two) times daily with a meal.  Dispense: 180 tablet; Refill: 2  2. Essential hypertension Discussed the importance of taking medication consistently in order to achieve positive outcomes. Will restart amlodipine. Patient will return in 2 weeks for bp check.  - amLODipine (NORVASC) 10 MG tablet; Take 1 tablet (10 mg total) by mouth daily.  Dispense: 90 tablet; Refill: 1  3. Colon cancer screening  - Ambulatory referral to Gastroenterology  4. Encounter for screening mammogram for malignant neoplasm of breast  - MM 3D SCREEN BREAST BILATERAL; Future  5. Type 2 diabetes mellitus without complication, without long-term current use of insulin (HCC)  - Microalbumin/Creatinine Ratio, Urine - CMP and Liver  6. Generalized anxiety disorder  - sertraline (ZOLOFT) 50 MG tablet; Take 1 tablet (50 mg total) by mouth daily.  Dispense: 90 tablet; Refill: 1 - Ambulatory referral to Psychiatry  7. Dysthymia  - sertraline (ZOLOFT) 50 MG tablet; Take 1 tablet (50 mg total) by mouth daily.  Dispense: 90 tablet; Refill: 1  8. Hyperlipidemia associated with type 2 diabetes mellitus ([place]) The 10-year ASCVD risk score (Arnett DK, et al., 2019) is: 24.5%   Values used to calculate the score:     Age: 23 years     Sex: Female     Is Non-Hispanic African American: Yes     Diabetic: Yes     Tobacco smoker: No     Systolic Blood Pressure: 696 mmHg     Is BP treated: Yes     HDL Cholesterol: 66 mg/dL     Total Cholesterol: 200 mg/dL  - atorvastatin (LIPITOR) 20 MG tablet; Take 1  tablet (20 mg total) by mouth daily.  Dispense: 90 tablet; Refill: 1 - Lipid Panel   Return in about 6 months (around 08/24/2022) for diabetes, hypertension, fibromyalgia.    Donia Pounds  APRN, MSN, FNP-C [facility] [address] [place], [place] [address] [phone]

## 2022-04-08 ENCOUNTER — Encounter (HOSPITAL_COMMUNITY): Payer: Self-pay

## 2022-04-08 ENCOUNTER — Emergency Department (HOSPITAL_COMMUNITY): Payer: BLUE CROSS/BLUE SHIELD

## 2022-04-08 ENCOUNTER — Emergency Department (HOSPITAL_COMMUNITY)
Admission: EM | Admit: 2022-04-08 | Discharge: 2022-04-09 | Disposition: A | Discharge status: Home / Self Care | Payer: BLUE CROSS/BLUE SHIELD | Attending: Emergency Medicine | Admitting: Emergency Medicine

## 2022-04-08 ENCOUNTER — Other Ambulatory Visit: Payer: Self-pay

## 2022-04-08 DIAGNOSIS — R1012 Left upper quadrant pain: Secondary | ICD-10-CM | POA: Diagnosis not present

## 2022-04-08 DIAGNOSIS — I10 Essential (primary) hypertension: Secondary | ICD-10-CM | POA: Insufficient documentation

## 2022-04-08 DIAGNOSIS — E119 Type 2 diabetes mellitus without complications: Secondary | ICD-10-CM | POA: Diagnosis not present

## 2022-04-08 DIAGNOSIS — Z7984 Long term (current) use of oral hypoglycemic drugs: Secondary | ICD-10-CM | POA: Diagnosis not present

## 2022-04-08 DIAGNOSIS — Z79899 Other long term (current) drug therapy: Secondary | ICD-10-CM | POA: Diagnosis not present

## 2022-04-08 DIAGNOSIS — R1032 Left lower quadrant pain: Secondary | ICD-10-CM | POA: Diagnosis not present

## 2022-04-08 DIAGNOSIS — R1010 Upper abdominal pain, unspecified: Secondary | ICD-10-CM | POA: Diagnosis present

## 2022-04-08 DIAGNOSIS — R109 Unspecified abdominal pain: Secondary | ICD-10-CM

## 2022-04-08 LAB — CBC WITH DIFFERENTIAL/PLATELET
Abs Immature Granulocytes: 0.04 10*3/uL (ref 0.00–0.07)
Basophils Absolute: 0.1 10*3/uL (ref 0.0–0.1)
Basophils Relative: 0 %
Eosinophils Absolute: 0.1 10*3/uL (ref 0.0–0.5)
Eosinophils Relative: 1 %
HCT: 44.2 % (ref 36.0–46.0)
Hemoglobin: 14.1 g/dL (ref 12.0–15.0)
Immature Granulocytes: 0 %
Lymphocytes Relative: 29 %
Lymphs Abs: 4.1 10*3/uL — ABNORMAL HIGH (ref 0.7–4.0)
MCH: 30.7 pg (ref 26.0–34.0)
MCHC: 31.9 g/dL (ref 30.0–36.0)
MCV: 96.1 fL (ref 80.0–100.0)
Monocytes Absolute: 0.9 10*3/uL (ref 0.1–1.0)
Monocytes Relative: 7 %
Neutro Abs: 8.8 10*3/uL — ABNORMAL HIGH (ref 1.7–7.7)
Neutrophils Relative %: 63 %
Platelets: 329 10*3/uL (ref 150–400)
RBC: 4.6 MIL/uL (ref 3.87–5.11)
RDW: 13.3 % (ref 11.5–15.5)
WBC: 13.9 10*3/uL — ABNORMAL HIGH (ref 4.0–10.5)
nRBC: 0 % (ref 0.0–0.2)

## 2022-04-08 LAB — URINALYSIS, ROUTINE W REFLEX MICROSCOPIC
Bilirubin Urine: NEGATIVE
Glucose, UA: NEGATIVE mg/dL
Ketones, ur: NEGATIVE mg/dL
Leukocytes,Ua: NEGATIVE
Nitrite: NEGATIVE
Protein, ur: NEGATIVE mg/dL
Specific Gravity, Urine: 1.01 (ref 1.005–1.030)
pH: 6 (ref 5.0–8.0)

## 2022-04-08 LAB — COMPREHENSIVE METABOLIC PANEL
ALT: 19 U/L (ref 0–44)
AST: 18 U/L (ref 15–41)
Albumin: 4.2 g/dL (ref 3.5–5.0)
Alkaline Phosphatase: 77 U/L (ref 38–126)
Anion gap: 9 (ref 5–15)
BUN: 13 mg/dL (ref 6–20)
CO2: 26 mmol/L (ref 22–32)
Calcium: 9.8 mg/dL (ref 8.9–10.3)
Chloride: 100 mmol/L (ref 98–111)
Creatinine, Ser: 0.85 mg/dL (ref 0.44–1.00)
GFR, Estimated: >60 mL/min (ref >60)
Glucose, Bld: 128 mg/dL — ABNORMAL HIGH (ref 70–99)
Potassium: 3.9 mmol/L (ref 3.5–5.1)
Sodium: 135 mmol/L (ref 135–145)
Total Bilirubin: 0.6 mg/dL (ref 0.3–1.2)
Total Protein: 7.9 g/dL (ref 6.5–8.1)

## 2022-04-08 LAB — PREGNANCY, URINE: Preg Test, Ur: NEGATIVE

## 2022-04-08 LAB — LIPASE, BLOOD: Lipase: 32 U/L (ref 11–51)

## 2022-04-08 MED ORDER — CEPHALEXIN 500 MG PO CAPS: 500.0000 mg | ORAL_CAPSULE | Freq: Four times a day (QID) | ORAL | 0 refills | Status: AC

## 2022-04-08 MED ORDER — IOHEXOL 300 MG/ML  SOLN
100.0000 mL | Freq: Once | INTRAMUSCULAR | Status: AC | PRN
  Administered 2022-04-08: 100 mL via INTRAVENOUS

## 2022-04-08 NOTE — ED Provider Triage Note (Signed)
Emergency Medicine Provider Triage Evaluation Note  Jessica Garrett , a 51 y.o. female  was evaluated in triage.  Pt complains of upper abdominal pain x 3 days.  Previous cholecystectomy.  Patient also endorses left-sided flank pain that radiates to groin region.  No vaginal or urinary symptoms.  Admits to nausea and diarrhea.  No fever or chills.  Denies chest pain or shortness of breath. Occasional alcohol use.   Review of Systems  Positive: Abdominal pain Negative: fever  Physical Exam  BP (!) 148/86   Pulse 81   Temp 98.3 F (36.8 C) (Oral)   Resp 16   Ht 5\' 4"  (1.626 m)   Wt 68 kg   SpO2 100%   BMI 25.75 kg/m  Gen:   Awake, no distress   Resp:  Normal effort  MSK:   Moves extremities without difficulty  Other:  Epigastric tenderness  Medical Decision Making  Medically screening exam initiated at 8:10 PM.  Appropriate orders placed.  Bobbe Primeau was informed that the remainder of the evaluation will be completed by another provider, this initial triage assessment does not replace that evaluation, and the importance of remaining in the ED until their evaluation is complete.  Abdominal labs CT abdomen EKG   Earvin Hansen, PA-C 04/08/22 2012

## 2022-04-08 NOTE — ED Triage Notes (Signed)
Upper abdominal pain x 3 days. Admits to nausea and diarrhea.   Also sts left flank pain that radiates to groin area.

## 2022-04-08 NOTE — Discharge Instructions (Addendum)
Take Keflex as prescribed until finished. Take ibuprofen for pain, as needed. Follow up with your primary care doctor to ensure resolution of symptoms. Return if they persist or worsen.

## 2022-04-08 NOTE — ED Provider Notes (Signed)
[facility] DEPT Provider Note   CSN: [id number] Arrival date & time: 04/08/22  1916     History  Chief Complaint  Patient presents with   Abdominal Pain    Jessica Garrett is a 51 y.o. female.  51 year old female presents to the emergency department for 3 days of upper abdominal pain as well as left-sided abdominal pain and flank pain.  She has had some associated nausea as well as diarrhea.  Denies taking any medications for her symptoms as well as any known sick contacts.  She has not had associated dysuria, hematuria, vaginal bleeding, vaginal discharge, fever, chills, chest pain or shortness of breath.  Abdominal surgical history significant for cholecystectomy.  Denies concern for STDs.  The history is provided by the patient. No language interpreter was used.  Abdominal Pain      Home Medications Prior to Admission medications   Medication Sig Start Date End Date Taking? Authorizing Provider  cephALEXin (KEFLEX) 500 MG capsule Take 1 capsule (500 mg total) by mouth 4 (four) times daily for 10 days. 04/08/22 04/18/22 Yes Antonietta Breach, PA-C  amLODipine (NORVASC) 10 MG tablet Take 1 tablet (10 mg total) by mouth daily. 02/22/22   Dorena Dew, FNP  atorvastatin (LIPITOR) 20 MG tablet Take 1 tablet (20 mg total) by mouth daily. 02/22/22   Dorena Dew, FNP  metFORMIN (GLUCOPHAGE) 1000 MG tablet Take 1 tablet (1,000 mg total) by mouth 2 (two) times daily with a meal. 02/22/22   Dorena Dew, FNP  PEG-KCl-NaCl-NaSulf-Na Asc-C (PLENVU) 140 g SOLR Take 1 kit by mouth as directed. Patient not taking: Reported on 02/22/2022 06/23/21   Mauri Pole, MD  sertraline (ZOLOFT) 50 MG tablet Take 1 tablet (50 mg total) by mouth daily. 02/22/22   Dorena Dew, FNP      Allergies    Patient has no known allergies.    Review of Systems   Review of Systems  Gastrointestinal:  Positive for abdominal pain.  Ten systems reviewed and are  negative for acute change, except as noted in the HPI.    Physical Exam Updated Vital Signs BP 110/61   Pulse 87   Temp (!) 97.3 F (36.3 C) (Oral)   Resp 16   Ht _0  (1.626 m)   Wt 68 kg   SpO2 98%   BMI 25.75 kg/m   Physical Exam Vitals and nursing note reviewed.  Constitutional:      General: She is not in acute distress.    Appearance: She is well-developed. She is not diaphoretic.     Comments: Nontoxic-appearing and in no acute distress  HENT:     Head: Normocephalic and atraumatic.  Eyes:     General: No scleral icterus.    Conjunctiva/sclera: Conjunctivae normal.  Pulmonary:     Effort: Pulmonary effort is normal. No respiratory distress.  Abdominal:     Palpations: Abdomen is soft.     Tenderness: There is abdominal tenderness. There is no guarding.     Comments: Mild tenderness with deep palpation to the left lower quadrant and left upper quadrant.  No palpable masses or peritoneal signs.  Musculoskeletal:        General: Normal range of motion.     Cervical back: Normal range of motion.  Skin:    General: Skin is warm and dry.     Coloration: Skin is not pale.     Findings: No erythema or rash.  Neurological:  Mental Status: She is alert and oriented to person, place, and time.     Coordination: Coordination normal.  Psychiatric:        Behavior: Behavior normal.     ED Results / Procedures / Treatments   Labs (all labs ordered are listed, but only abnormal results are displayed) Labs Reviewed  CBC WITH DIFFERENTIAL/PLATELET - Abnormal; Notable for the following components:      Result Value   WBC 13.9 (*)    Neutro Abs 8.8 (*)    Lymphs Abs 4.1 (*)    All other components within normal limits  COMPREHENSIVE METABOLIC PANEL - Abnormal; Notable for the following components:   Glucose, Bld 128 (*)    All other components within normal limits  URINALYSIS, ROUTINE W REFLEX MICROSCOPIC - Abnormal; Notable for the following components:   Color,  Urine STRAW (*)    Hgb urine dipstick SMALL (*)    Bacteria, UA RARE (*)    All other components within normal limits  LIPASE, BLOOD  PREGNANCY, URINE    EKG EKG Interpretation  Date/Time:  Friday April 08 2022 20:54:54 EST Ventricular Rate:  84 PR Interval:  121 QRS Duration: 74 QT Interval:  338 QTC Calculation: 400 R Axis:   51 Text Interpretation: Sinus rhythm Consider left ventricular hypertrophy Confirmed by Ripley Fraise [phone]) on 04/08/2022 11:06:09 PM  Radiology CT ABDOMEN PELVIS W CONTRAST  Result Date: 04/08/2022 CLINICAL DATA:  Left lower quadrant pain EXAM: CT ABDOMEN AND PELVIS WITH CONTRAST TECHNIQUE: Multidetector CT imaging of the abdomen and pelvis was performed using the standard protocol following bolus administration of intravenous contrast. RADIATION DOSE REDUCTION: This exam was performed according to the departmental dose-optimization program which includes automated exposure control, adjustment of the mA and/or kV according to patient size and/or use of iterative reconstruction technique. CONTRAST:  175m OMNIPAQUE IOHEXOL 300 MG/ML  SOLN COMPARISON:  03/01/2016 FINDINGS: Lower chest: No acute abnormality. Hepatobiliary: Fatty infiltration of the liver is noted. The gallbladder has been surgically removed. Pancreas: Unremarkable. No pancreatic ductal dilatation or surrounding inflammatory changes. Spleen: Normal in size without focal abnormality. Adrenals/Urinary Tract: Adrenal glands are within normal limits. Kidneys demonstrate enhancement bilaterally although some areas of decreased enhancement are noted throughout both kidneys right worse than left. These changes are suspicious for pyelonephritis. Bladder is partially distended. Stomach/Bowel: The appendix is well visualized. No obstructive or inflammatory changes of the colon are seen. Small bowel and stomach appear within normal limits. Vascular/Lymphatic: Aortic atherosclerosis. No enlarged abdominal or  pelvic lymph nodes. Reproductive: Uterus and bilateral adnexa are unremarkable. Other: No abdominal wall hernia or abnormality. No abdominopelvic ascites. Musculoskeletal: No acute or significant osseous findings. IMPRESSION: Patchy enhancement in the kidneys bilaterally right slightly greater than left suspicious for pyelonephritis. Correlate with laboratory values. Electronically Signed   By: MInez CatalinaM.D.   On: 04/08/2022 22:52    Procedures Procedures    Medications Ordered in ED Medications  iohexol (OMNIPAQUE) 300 MG/ML solution 100 mL (100 mLs Intravenous Contrast Given 04/08/22 2227)    ED Course/ Medical Decision Making/ A&P                           Medical Decision Making Risk Prescription drug management.   This patient presents to the ED for concern of abdominal pain, this involves an extensive number of treatment options, and is a complaint that carries with it a high risk of complications and morbidity.  The  differential diagnosis includes PUD vs colitis vs diverticulitis vs pyelonephritis vs ovarian torsion vs PID vs ectopic pregnancy.   Co morbidities that complicate the patient evaluation  HTN DM   Additional history obtained:  External records from outside source obtained and reviewed including CMP from 02/22/22 which was WNL   Lab Tests:  I Ordered, and personally interpreted labs.  The pertinent results include: New leukocytosis of 13.9 with a left shift.  CMP, lipase, urinalysis all normal.  Pregnancy negative.   Imaging Studies ordered:  I ordered imaging studies including CT abdomen/pelvis  I independently visualized and interpreted imaging which showed patchy enhancement of the bilateral kidneys suspicious for pyelonephritis. I agree with the radiologist interpretation   Cardiac Monitoring:  The patient was maintained on a cardiac monitor.  I personally viewed and interpreted the cardiac monitored which showed an underlying rhythm of:  NSR   Medicines ordered and prescription drug management:  I have reviewed the patients home medicines and have made adjustments as needed   Test Considered:  Wet prep GC/chlamydia   Reevaluation:  After the interventions noted above, I reevaluated the patient and found that they have :stayed the same   Social Determinants of Health:  Insured patient    Dispostion:  After consideration of the diagnostic results and the patients response to treatment, I feel that the patent would benefit from outpatient course of Keflex for presumed pyelonephritis given CT imaging results as well as leukocytosis.  Patient has been encouraged to follow-up with her primary care doctor for recheck. Return precautions discussed and provided. Patient discharged in stable condition with no unaddressed concerns.          Final Clinical Impression(s) / ED Diagnoses Final diagnoses:  Abdominal pain, unspecified abdominal location    Rx / DC Orders ED Discharge Orders          Ordered    cephALEXin (KEFLEX) 500 MG capsule  4 times daily        04/08/22 2338              Antonietta Breach, PA-C 04/09/22 0534    Ripley Fraise, MD 04/09/22 [phone]

## 2022-04-11 ENCOUNTER — Ambulatory Visit: Payer: BLUE CROSS/BLUE SHIELD

## 2022-04-11 ENCOUNTER — Telehealth: Payer: Self-pay

## 2022-04-11 NOTE — Telephone Encounter (Signed)
Transition Care Management Follow-up Telephone Call Date of discharge and from where: 04/09/22 How have you been since you were released from the hospital? Per pt she has still been in pain. Any questions or concerns? No  Items Reviewed: Did the pt receive and understand the discharge instructions provided? Yes  Medications obtained and verified? Yes  Other? No  Any new allergies since your discharge? No  Dietary orders reviewed? No Do you have support at home? Yes       Follow up appointments reviewed:  PCP Hospital f/u appt confirmed? Yes  Scheduled to see Carlos American on 04/18/22 @ 8 am. [facility] f/u appt confirmed? No   Are transportation arrangements needed? No  If their condition worsens, is the pt aware to call PCP or go to the Emergency Dept.? Yes Was the patient provided with contact information for the PCP's office or ED? Yes Was to pt encouraged to call back with questions or concerns? Yes .  Renelda Loma RMA

## 2022-04-18 ENCOUNTER — Encounter: Payer: Self-pay | Admitting: Nurse Practitioner

## 2022-04-18 ENCOUNTER — Ambulatory Visit (INDEPENDENT_AMBULATORY_CARE_PROVIDER_SITE_OTHER): Payer: BLUE CROSS/BLUE SHIELD | Admitting: Nurse Practitioner

## 2022-04-18 VITALS — BP 130/68 | HR 65 | Temp 97.7°F | Ht 64.0 in | Wt 146.2 lb

## 2022-04-18 DIAGNOSIS — F320 Major depressive disorder, single episode, mild: Secondary | ICD-10-CM

## 2022-04-18 DIAGNOSIS — N12 Tubulo-interstitial nephritis, not specified as acute or chronic: Secondary | ICD-10-CM | POA: Insufficient documentation

## 2022-04-18 DIAGNOSIS — E119 Type 2 diabetes mellitus without complications: Secondary | ICD-10-CM | POA: Insufficient documentation

## 2022-04-18 DIAGNOSIS — H5713 Ocular pain, bilateral: Secondary | ICD-10-CM | POA: Diagnosis not present

## 2022-04-18 DIAGNOSIS — E1165 Type 2 diabetes mellitus with hyperglycemia: Secondary | ICD-10-CM | POA: Insufficient documentation

## 2022-04-18 DIAGNOSIS — J329 Chronic sinusitis, unspecified: Secondary | ICD-10-CM

## 2022-04-18 MED ORDER — SALINE SPRAY 0.65 % NA SOLN: 1.0000 | NASAL | 0 refills | Status: AC | PRN

## 2022-04-18 MED ORDER — FLUTICASONE PROPIONATE 50 MCG/ACT NA SUSP: 2.0000 | Freq: Every day | NASAL | 6 refills | Status: AC

## 2022-04-18 NOTE — Assessment & Plan Note (Signed)
    04/18/2022    8:25 AM 02/22/2022   10:32 AM 04/21/2021    1:59 PM 12/08/2020   10:50 AM 05/19/2020   12:51 PM  Depression screen PHQ 2/9  Decreased Interest 0 1 1 1 1   Down, Depressed, Hopeless 3 3 3 1 1   PHQ - 2 Score 3 4 4 2 2   Altered sleeping 1 1 3 1  0  Tired, decreased energy 1 1 1 1  0  Change in appetite 1 1 3 1  0  Feeling bad or failure about yourself  2 1 3 1  0  Trouble concentrating 3 1 3 3 1   Moving slowly or fidgety/restless 0 0 0 0 1  Suicidal thoughts 0 1 0 0 0  PHQ-9 Score 11 10 17 9 4   Difficult doing work/chores Somewhat difficult Somewhat difficult   Not difficult at all   Currently on zoloft 50mg  daily  Discuss referral for counseling at next office if condition remains uncontrolled

## 2022-04-18 NOTE — Progress Notes (Signed)
See notes above

## 2022-04-18 NOTE — Assessment & Plan Note (Addendum)
Vision 20/25 in both eyes  Intermittent since last week , denies nausea, vomiting, new blurry vision, confusion.  Wears prescription glasses, Last eye exam was 2 years ago Urgent referral sent for ophthalmology.  Flonase ordered for rhinitis.

## 2022-04-18 NOTE — Patient Instructions (Signed)
1. Eye pain, bilateral  - Ambulatory referral to Ophthalmology  2. Acute rhinitis  - fluticasone (FLONASE) 50 MCG/ACT nasal spray; Place 2 sprays into both nostrils daily.  Dispense: 16 g; Refill: 6 - sodium chloride (OCEAN) 0.65 % SOLN nasal spray; Place 1 spray into both nostrils as needed for congestion.  Dispense: 30 mL; Refill: 0  It is important that you exercise regularly at least 30 minutes 5 times a week as tolerated  Think about what you will eat, plan ahead. Choose " clean, green, fresh or frozen" over canned, processed or packaged foods which are more sugary, salty and fatty. 70 to 75% of food eaten should be vegetables and fruit. Three meals at set times with snacks allowed between meals, but they must be fruit or vegetables. Aim to eat over a 12 hour period , example 7 am to 7 pm, and STOP after  your last meal of the day. Drink water,generally about 64 ounces per day, no other drink is as healthy. Fruit juice is best enjoyed in a healthy way, by EATING the fruit.  Thanks for choosing [facility] we consider it a privelige to serve you.

## 2022-04-18 NOTE — Assessment & Plan Note (Addendum)
Currently taking keflex for pyelonephritis.   Start  fluticasone (FLONASE) 50 MCG/ACT nasal spray; Place 2 sprays into both nostrils daily.  Dispense: 16 g; Refill: 6 - sodium chloride (OCEAN) 0.65 % SOLN nasal spray; Place 1 spray into both nostrils as needed for congestion.  Dispense: 30 mL; Refill: 0  Patient encouraged to take ibuprofen as needed for HA .

## 2022-04-18 NOTE — Assessment & Plan Note (Signed)
Lab Results  Component Value Date   HGBA1C 6.8 02/22/2022   Well controlled on metfromin 1000mg  BID Patient referred for eye exam today

## 2022-04-18 NOTE — Assessment & Plan Note (Signed)
She was seen in the ER on 11/24 for abdominal pin, treated for pyelonephritis.  Taking Keflex 500mg  QID.   Patchy enhancement in the kidneys bilaterally right slightly greater than left suspicious for pyelonephritis. Correlate with laboratory values.  Currently denies abdominal pain, urinary symptoms, flank pain today  Patient encouraged to dink at least 64 ounces of water daily to maintain hydration.

## 2022-04-18 NOTE — Progress Notes (Signed)
Established Patient Office Visit  Subjective:  Patient ID: Jessica Garrett, female    DOB: [birth year]  Age: 51 y.o. MRN: [id number]  CC:  Chief Complaint  Patient presents with   Hospitalization Follow-up    Still having sinus pressure    HPI Jessica Garrett is a 51 y.o. female with past medical history of type 2 diabetes, hypertension, diabetes , presents for f/u for  ER visit on 04/08/2022 for pyelonephritis . Today patient denies abdominal pain, dysuria, fever, chills,malaise,  flank pain, blood in the urine, still taking kfelex ordered.   Patient complained of mild intermittent left sided frontal HA, light sensitivity, intermittent pain radiating pain in both eyes, itchy eyes, stuffy nose,sneezing, rhinorrhea since last week. She has had 3 episodes of the HA, light sensitivity and eye pain since last week. She denies new blurry vision, nausea vomiting, confusion, she has been taking ibuprofen as needed for HA, HA has been responsive to ibuprofen, she is also taking benadry as needed for her sneezing. She wears prescription glasses and her last eye exam was about 2 years ago. She denies sinus pressure on  examination.     Past Medical History:  Diagnosis Date   Anemia    only with pregnancy   Diabetes mellitus without complication (HCC)    GERD (gastroesophageal reflux disease)    Headache    " I just go to bed when I have a headache"   Hypertension     Past Surgical History:  Procedure Laterality Date   CHOLECYSTECTOMY N/A 04/26/2016   Procedure: LAPAROSCOPIC CHOLECYSTECTOMY;  Surgeon: Ralene Ok, MD;  Location: [facility];  Service: General;  Laterality: N/A;   [place]     x3   [place]     only on the R side , pt. reports that she was a wake for the procedure     Family History  Problem Relation Age of Onset   Heart disease Mother    Colon cancer Father    Hypertension Father    Diabetes Brother    Hypertension Brother     Diabetes Maternal Grandmother    Colon polyps Neg Hx    Esophageal cancer Neg Hx    Rectal cancer Neg Hx    Stomach cancer Neg Hx     Social History   Socioeconomic History   Marital status: Single    Spouse name: Not on file   Number of children: Not on file   Years of education: Not on file   Highest education level: Not on file  Occupational History   Not on file  Tobacco Use   Smoking status: Never   Smokeless tobacco: Never  Vaping Use   Vaping Use: Never used  Substance and Sexual Activity   Alcohol use: Yes    Comment: occasional   Drug use: No   Sexual activity: Yes    Birth control/protection: None  Other Topics Concern   Not on file  Social History Narrative   Pt stated she is a social drinker   Social Determinants of Radio broadcast assistant Strain: Not on file  Food Insecurity: Not on file  Transportation Needs: Not on file  Physical Activity: Not on file  Stress: Not on file  Social Connections: Not on file  Intimate Partner Violence: Not on file    Outpatient Medications Prior to Visit  Medication Sig Dispense Refill   amLODipine (NORVASC) 10 MG tablet Take 1  tablet (10 mg total) by mouth daily. 90 tablet 1   atorvastatin (LIPITOR) 20 MG tablet Take 1 tablet (20 mg total) by mouth daily. 90 tablet 1   cephALEXin (KEFLEX) 500 MG capsule Take 1 capsule (500 mg total) by mouth 4 (four) times daily for 10 days. 40 capsule 0   metFORMIN (GLUCOPHAGE) 1000 MG tablet Take 1 tablet (1,000 mg total) by mouth 2 (two) times daily with a meal. 180 tablet 2   sertraline (ZOLOFT) 50 MG tablet Take 1 tablet (50 mg total) by mouth daily. 90 tablet 1   PEG-KCl-NaCl-NaSulf-Na Asc-C (PLENVU) 140 g SOLR Take 1 kit by mouth as directed. (Patient not taking: Reported on 02/22/2022) 1 each 0   No facility-administered medications prior to visit.    No Known Allergies  ROS Review of Systems  Constitutional:  Negative for activity change, appetite change, chills,  diaphoresis, fatigue and fever.  HENT:  Positive for postnasal drip, rhinorrhea and sneezing.   Eyes:  Positive for photophobia, pain and itching. Negative for discharge and redness.  Respiratory:  Negative for choking, chest tightness, shortness of breath and wheezing.   Cardiovascular: Negative.  Negative for chest pain, palpitations and leg swelling.  Gastrointestinal:  Negative for abdominal distention, abdominal pain, diarrhea, nausea and vomiting.  Neurological:  Negative for dizziness, facial asymmetry, light-headedness, numbness and headaches.  Psychiatric/Behavioral:  Negative for agitation, behavioral problems and confusion.       Objective:    Physical Exam Constitutional:      General: She is not in acute distress.    Appearance: Normal appearance. She is not ill-appearing, toxic-appearing or diaphoretic.  HENT:     Nose: Congestion and rhinorrhea present. Rhinorrhea is clear.     Right Nostril: No foreign body or epistaxis.     Right Turbinates: Enlarged and swollen.     Left Turbinates: Enlarged.     Right Sinus: No maxillary sinus tenderness or frontal sinus tenderness.     Left Sinus: No maxillary sinus tenderness or frontal sinus tenderness.     Mouth/Throat:     Mouth: Mucous membranes are moist.     Pharynx: Oropharynx is clear. No oropharyngeal exudate or posterior oropharyngeal erythema.  Eyes:     General: No scleral icterus.       Right eye: No discharge.        Left eye: No discharge.     Extraocular Movements: Extraocular movements intact.     Conjunctiva/sclera: Conjunctivae normal.  Cardiovascular:     Rate and Rhythm: Normal rate and regular rhythm.     Pulses: Normal pulses.     Heart sounds: Normal heart sounds. No murmur heard.    No friction rub. No gallop.  Pulmonary:     Effort: Pulmonary effort is normal. No respiratory distress.     Breath sounds: Normal breath sounds. No stridor. No wheezing, rhonchi or rales.  Chest:     Chest wall: No  tenderness.  Abdominal:     General: There is no distension.     Tenderness: There is no abdominal tenderness. There is no left CVA tenderness, guarding or rebound.     Hernia: No hernia is present.  Musculoskeletal:        General: No swelling, tenderness, deformity or signs of injury.  Skin:    General: Skin is warm and dry.     Capillary Refill: Capillary refill takes less than 2 seconds.  Neurological:     Mental Status: She is alert and  oriented to person, place, and time.     Motor: No weakness.     Coordination: Coordination normal.     Gait: Gait normal.  Psychiatric:        Mood and Affect: Mood normal.        Behavior: Behavior normal.        Thought Content: Thought content normal.        Judgment: Judgment normal.     BP 130/68   Pulse 65   Temp 97.7 F (36.5 C)   Ht _0  (1.626 m)   Wt 146 lb 3.2 oz (66.3 kg)   LMP  (LMP Unknown)   SpO2 100%   BMI 25.10 kg/m  Wt Readings from Last 3 Encounters:  04/18/22 146 lb 3.2 oz (66.3 kg)  04/08/22 150 lb (68 kg)  02/22/22 147 lb 3.2 oz (66.8 kg)    Lab Results  Component Value Date   TSH 1.500 05/01/2017   Lab Results  Component Value Date   WBC 13.9 (H) 04/08/2022   HGB 14.1 04/08/2022   HCT 44.2 04/08/2022   MCV 96.1 04/08/2022   PLT 329 04/08/2022   Lab Results  Component Value Date   NA 135 04/08/2022   K 3.9 04/08/2022   CO2 26 04/08/2022   GLUCOSE 128 (H) 04/08/2022   BUN 13 04/08/2022   CREATININE 0.85 04/08/2022   BILITOT 0.6 04/08/2022   ALKPHOS 77 04/08/2022   AST 18 04/08/2022   ALT 19 04/08/2022   PROT 7.9 04/08/2022   ALBUMIN 4.2 04/08/2022   CALCIUM 9.8 04/08/2022   ANIONGAP 9 04/08/2022   EGFR 94 02/22/2022   Lab Results  Component Value Date   CHOL 200 (H) 02/22/2022   Lab Results  Component Value Date   HDL 66 02/22/2022   Lab Results  Component Value Date   LDLCALC 123 (H) 02/22/2022   Lab Results  Component Value Date   TRIG 62 02/22/2022   Lab Results   Component Value Date   CHOLHDL 3.0 02/22/2022   Lab Results  Component Value Date   HGBA1C 6.8 02/22/2022      Assessment & Plan:   Problem List Items Addressed This Visit       Respiratory   Rhinosinusitis    Currently taking keflex for pyelonephritis.   Start  fluticasone (FLONASE) 50 MCG/ACT nasal spray; Place 2 sprays into both nostrils daily.  Dispense: 16 g; Refill: 6 - sodium chloride (OCEAN) 0.65 % SOLN nasal spray; Place 1 spray into both nostrils as needed for congestion.  Dispense: 30 mL; Refill: 0  Patient encouraged to take ibuprofen as needed for HA .       Relevant Medications   fluticasone (FLONASE) 50 MCG/ACT nasal spray   sodium chloride (OCEAN) 0.65 % SOLN nasal spray     Endocrine   New onset type 2 diabetes mellitus ([place])    Lab Results  Component Value Date   HGBA1C 6.8 02/22/2022   Well controlled on metfromin 1052m BID Patient referred for eye exam today         Genitourinary   Pyelonephritis - Primary    She was seen in the ER on 11/24 for abdominal pin, treated for pyelonephritis.  Taking Keflex 5055mQID.   Patchy enhancement in the kidneys bilaterally right slightly greater than left suspicious for pyelonephritis. Correlate with laboratory values.  Currently denies abdominal pain, urinary symptoms, flank pain today  Patient encouraged to dink at least 64 ounces of  water daily to maintain hydration.           Other   Current mild episode of major depressive disorder without prior episode ([employer])       04/18/2022    8:25 AM 02/22/2022   10:32 AM 04/21/2021    1:59 PM 12/08/2020   10:50 AM 05/19/2020   12:51 PM  Depression screen PHQ 2/9  Decreased Interest 0 _0 Down, Depressed, Hopeless _1 PHQ - 2 Score _2 Altered sleeping _3 0  Tired, decreased energy _4 0  Change in appetite _5 0  Feeling bad or failure about yourself  _6 0  Trouble concentrating _7 Moving slowly or  fidgety/restless 0 0 0 0 1  Suicidal thoughts 0 1 0 0 0  PHQ-9 Score _8 Difficult doing work/chores Somewhat difficult Somewhat difficult   Not difficult at all   Currently on zoloft 58m daily  Discuss referral for counseling at next office if condition remains uncontrolled       Eye pain, bilateral    Vision 20/25 in both eyes  Intermittent since last week , denies nausea, vomiting, new blurry vision, confusion.  Wears prescription glasses, Last eye exam was 2 years ago Urgent referral sent for ophthalmology.  Flonase ordered for rhinitis.       Relevant Orders   Ambulatory referral to Ophthalmology    Meds ordered this encounter  Medications   fluticasone (FLONASE) 50 MCG/ACT nasal spray    Sig: Place 2 sprays into both nostrils daily.    Dispense:  16 g    Refill:  6   sodium chloride (OCEAN) 0.65 % SOLN nasal spray    Sig: Place 1 spray into both nostrils as needed for congestion.    Dispense:  30 mL    Refill:  0    Follow-up: No follow-ups on file.    FRenee Rival FNP

## 2022-04-22 ENCOUNTER — Ambulatory Visit: Payer: BLUE CROSS/BLUE SHIELD

## 2022-05-04 ENCOUNTER — Ambulatory Visit (HOSPITAL_COMMUNITY): Payer: Self-pay | Admitting: Student

## 2022-06-01 ENCOUNTER — Encounter (HOSPITAL_COMMUNITY): Payer: Self-pay | Admitting: Student

## 2022-06-01 ENCOUNTER — Ambulatory Visit (INDEPENDENT_AMBULATORY_CARE_PROVIDER_SITE_OTHER): Payer: Self-pay | Admitting: Student

## 2022-06-01 VITALS — BP 185/104 | HR 75 | Ht 64.0 in | Wt 147.0 lb

## 2022-06-01 DIAGNOSIS — F3132 Bipolar disorder, current episode depressed, moderate: Secondary | ICD-10-CM

## 2022-06-01 DIAGNOSIS — E785 Hyperlipidemia, unspecified: Secondary | ICD-10-CM

## 2022-06-01 DIAGNOSIS — E1122 Type 2 diabetes mellitus with diabetic chronic kidney disease: Secondary | ICD-10-CM

## 2022-06-01 DIAGNOSIS — F4310 Post-traumatic stress disorder, unspecified: Secondary | ICD-10-CM

## 2022-06-01 DIAGNOSIS — Z0000 Encounter for general adult medical examination without abnormal findings: Secondary | ICD-10-CM

## 2022-06-01 MED ORDER — CLONIDINE HCL 0.1 MG PO TABS: 0.1000 mg | ORAL_TABLET | Freq: Two times a day (BID) | ORAL | 0 refills | Status: DC

## 2022-06-01 NOTE — Patient Instructions (Signed)
Obtain labs from [facility] on 1/19 a.m.  These labs to be fasting, prior to eating anything.  You may visit the [facility] nearest to your home.  After [facility], advised visiting PCP for blood pressure check.  -Start clonidine 0.1 mg twice daily for PTSD symptoms - Follow-up visit with this office on 06/08/2022 at 2 PM to discuss labs and medication management.  Follow-up recommendations:  Activity:  Normal, as tolerated Diet:  Per PCP recommendation  Patient is instructed prior to discharge to: Take all medications as prescribed by her mental healthcare provider. Report any adverse effects and/or reactions from the medicines to her outpatient provider promptly. Patient has been instructed & cautioned: To not engage in alcohol and or illegal drug use while on prescription medicines.  In the event of worsening symptoms, patient is instructed to call the crisis hotline at 988, 911 and or go to the nearest ED for appropriate evaluation and treatment of symptoms. To follow-up with her primary care provider for your other medical issues, concerns and or health care needs.
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
## 2022-06-08 ENCOUNTER — Ambulatory Visit (INDEPENDENT_AMBULATORY_CARE_PROVIDER_SITE_OTHER): Payer: BLUE CROSS/BLUE SHIELD | Admitting: Student

## 2022-06-08 VITALS — BP 167/96 | HR 90

## 2022-06-08 DIAGNOSIS — E785 Hyperlipidemia, unspecified: Secondary | ICD-10-CM

## 2022-06-08 DIAGNOSIS — I10 Essential (primary) hypertension: Secondary | ICD-10-CM

## 2022-06-08 DIAGNOSIS — E1122 Type 2 diabetes mellitus with diabetic chronic kidney disease: Secondary | ICD-10-CM | POA: Diagnosis not present

## 2022-06-08 DIAGNOSIS — F4310 Post-traumatic stress disorder, unspecified: Secondary | ICD-10-CM

## 2022-06-08 DIAGNOSIS — F3132 Bipolar disorder, current episode depressed, moderate: Secondary | ICD-10-CM

## 2022-06-08 MED ORDER — HYDROXYZINE HCL 50 MG PO TABS: 50.0000 mg | ORAL_TABLET | Freq: Every evening | ORAL | 1 refills | Status: AC | PRN

## 2022-06-08 NOTE — Patient Instructions (Signed)
Follow-up recommendations:  Activity:  Normal, as tolerated Diet:  Per PCP recommendation  Patient is instructed prior to discharge to: Take all medications as prescribed by her mental healthcare provider. Report any adverse effects and/or reactions from the medicines to her outpatient provider promptly. Patient has been instructed & cautioned: To not engage in alcohol and or illegal drug use while on prescription medicines.  In the event of worsening symptoms, patient is instructed to call the crisis hotline at 988, 911, [facility] at [phone], behavioral health clinic, Emergency department  for appropriate evaluation and treatment of symptoms. To follow-up with her primary care provider for your other medical issues, concerns and or health care needs.

## 2022-06-08 NOTE — Progress Notes (Signed)
[facility] Progress Note  Date: 06/08/2022 4:24 PM Name: Jessica Garrett Age: 52 y.o.  DOB: [birth year]  MRN: [id number]  Chief Complaint:  Chief Complaint  Patient presents with   Follow-up   Other    Insomnia    Visit Diagnosis:    ICD-10-CM   1. Bipolar affective disorder, currently depressed, moderate (HCC)  F31.32     2. PTSD (post-traumatic stress disorder)  F43.10     3. Type 2 diabetes mellitus with chronic kidney disease, without long-term current use of insulin, unspecified CKD stage (HCC)  E11.22     4. Dyslipidemia  E78.5     5. Essential hypertension  I10       HPI: Jessica Garrett is a 52 year old female with a past psychiatric history of GAD and MDD without psychotic features presenting to establish care for depressive symptoms.   On assessment today, patient reports less frequent crying spells than previous week, and denies both active and passive SI.  She still denies the desire to be around others and decreased motivation to exercise, but she reports less anhedonia overall.  Her son plans to move into his own apartment this week, which she anticipates will be difficult.  Although she anticipates this difficulty, she denies feeling as though it would lead her to having suicidal thoughts.  We completed suicide risk patient safety plan, and patient participated well.  She does have access to guns at home, but denies the desire to use them in order to harm herself.  We discussed warning signs of crisis developing, and patient cannot identify anything that is currently triggering.  Ultimately, patient developed SI when she feels a loss of control.  Another point of concern is that patient feels as though she does not have proper support.  She is reminded of professional supports in place where personal supports may lack, and she is appreciative.  She is still having difficulty sleeping.  She reports taking medication (clonidine) at  bedtime every night over the past week and 4 days of morning dose as well.  She did not maintain compliance with morning dose because when she initially took medication, would get sleepy during the day, which she attributed to the medication.  However upon taking subsequent doses, she denies the same effect.  We discussed her sleep hygiene where his current practices include: hot shower, black out curtains, and watching a video on her phone until sleepy. Melatonin did not work. Has not trialed Hydroxyzine nor Trazodone.   Past Psychiatric History:  Prior Psychotropic Meds: Yes  Dx: MDD, GAD Prior Rx: Zoloft helps with "sleep." Never helped with mood nor overthinking. Makes feel groggy next day. Last doses were last month.  OP psych: Denies previous OP therapy: Denies Suicide attempt: Suicide attempts, 5-7x taking pills. 4 years ago most recent. Will force self to voimit.  Inpatient psych: Denies Violence: Denies  Past Medical History:  Past Medical History:  Diagnosis Date   Anemia    only with pregnancy   Diabetes mellitus without complication (HCC)    GERD (gastroesophageal reflux disease)    Headache    " I just go to bed when I have a headache"   Hypertension     Past Surgical History:  Procedure Laterality Date   CHOLECYSTECTOMY N/A 04/26/2016   Procedure: LAPAROSCOPIC CHOLECYSTECTOMY;  Surgeon: Axel Filler, MD;  Location: [facility] OR;  Service: General;  Laterality: N/A;   TUBAL LIGATION  1992   VAGINAL DELIVERY  x3   WISDOM TOOTH EXTRACTION     only on the R side , pt. reports that she was a wake for the procedure     Family Psychiatric History: To be obtained  Family History:  Family History  Problem Relation Age of Onset   Heart disease Mother    Colon cancer Father    Hypertension Father    Diabetes Brother    Hypertension Brother    Diabetes Maternal Grandmother    Colon polyps Neg Hx    Esophageal cancer Neg Hx    Rectal cancer Neg Hx    Stomach cancer Neg  Hx    Social History:  Social History   Socioeconomic History   Marital status: Single    Spouse name: Not on file   Number of children: Not on file   Years of education: Not on file   Highest education level: Not on file  Occupational History   Not on file  Tobacco Use   Smoking status: Never   Smokeless tobacco: Never  Vaping Use   Vaping Use: Never used  Substance and Sexual Activity   Alcohol use: Yes    Comment: occasional   Drug use: No   Sexual activity: Yes    Birth control/protection: None  Other Topics Concern   Not on file  Social History Narrative   Pt stated she is a social drinker   Social Determinants of Radio broadcast assistant Strain: Not on file  Food Insecurity: Not on file  Transportation Needs: Not on file  Physical Activity: Not on file  Stress: Not on file  Social Connections: Not on file   Allergies:  No Known Allergies Metabolic Disorder Labs: Lab Results  Component Value Date   HGBA1C 6.8 02/22/2022   MPG 120 04/11/2016   No results found for: "PROLACTIN" Lab Results  Component Value Date   CHOL 200 (H) 02/22/2022   TRIG 62 02/22/2022   HDL 66 02/22/2022   CHOLHDL 3.0 02/22/2022   VLDL 20 04/11/2016   LDLCALC 123 (H) 02/22/2022   LDLCALC 142 (H) 04/21/2020   Lab Results  Component Value Date   TSH 1.500 05/01/2017   Therapeutic Level Labs: No results found for: "LITHIUM" No results found for: "VALPROATE" No results found for: "CBMZ"  Current Medications: Current Outpatient Medications  Medication Sig Dispense Refill   amLODipine (NORVASC) 10 MG tablet Take 1 tablet (10 mg total) by mouth daily. 90 tablet 1   atorvastatin (LIPITOR) 20 MG tablet Take 1 tablet (20 mg total) by mouth daily. 90 tablet 1   cloNIDine (CATAPRES) 0.1 MG tablet Take 1 tablet (0.1 mg total) by mouth 2 (two) times daily. 60 tablet 0   fluticasone (FLONASE) 50 MCG/ACT nasal spray Place 2 sprays into both nostrils daily. 16 g 6   metFORMIN  (GLUCOPHAGE) 1000 MG tablet Take 1 tablet (1,000 mg total) by mouth 2 (two) times daily with a meal. 180 tablet 2   PEG-KCl-NaCl-NaSulf-Na Asc-C (PLENVU) 140 g SOLR Take 1 kit by mouth as directed. (Patient not taking: Reported on 02/22/2022) 1 each 0   sertraline (ZOLOFT) 50 MG tablet Take 1 tablet (50 mg total) by mouth daily. 90 tablet 1   sodium chloride (OCEAN) 0.65 % SOLN nasal spray Place 1 spray into both nostrils as needed for congestion. 30 mL 0   No current facility-administered medications for this visit.    Musculoskeletal: Strength & Muscle Tone: within normal limits Gait & Station: normal Patient  leans: N/A  Psychiatric Specialty Exam: Review of Systems  Blood pressure (!) 167/96, pulse 90, SpO2 98 %.There is no height or weight on file to calculate BMI.  General Appearance: Well Groomed  Eye Contact:  Good  Speech:  Clear and Coherent and Normal Rate  Volume:  Normal  Mood:  Euthymic  Affect:  Appropriate and Congruent  Thought Process:  Coherent and Linear  Orientation:  Full (Time, Place, and Person)  Thought Content: WDL and Logical   Suicidal Thoughts:  No  Homicidal Thoughts:  No  Memory:  Immediate;   Good Recent;   Good  Judgement:  Good  Insight:  Fair  Psychomotor Activity:  Normal  Concentration:  Concentration: Good and Attention Span: Good  Recall:  Good  Fund of Knowledge: Good  Language: Good  Akathisia:  No  Handed:  Right  AIMS (if indicated): not done  Assets:  Communication Skills Desire for Improvement Housing Resilience Vocational/Educational  ADL's:  Intact  Cognition: WNL  Sleep:  Poor   Screenings: GAD-7    Flowsheet Row Office Visit from 04/21/2020 in [place] Office Visit from 08/02/2017 in [place] Office Visit from 05/01/2017 in [place]  Total GAD-7 Score 21 12 12       PHQ2-9    [place] Office Visit from 06/01/2022 in [facility] Office Visit from 04/18/2022 in [place] Office Visit from 02/22/2022 in [place] Office Visit from 04/21/2021 in [place] Office Visit from 12/08/2020 in [place]  PHQ-2 Total Score 6 3 4 4 2   PHQ-9 Total Score 18 11 10 17 9       [place] Visit from 06/01/2022 in [facility] ED from 04/08/2022 in [facility] Department [facility] ED from 07/23/2021 in [facility] Emergency Department at [place] Error: Q3, 4, or 5 should not be populated when Q2 is No No Risk No Risk       Assessment and Plan:  Lenay Nunnelley is a 52 y.o. female with PMH MDD, GAD, who presented [place] today as a follow-up for the aforementioned diagnoses after having labs drawn.  Unfortunately, patient was unable to have labs drawn prior to this visit.  Of note, she has been mostly compliant with clonidine prescribed during last visit, and mild improvements to blood pressure are evident.  As patient has been noncompliant with medications for co-morbidities, would like to obtain labs prior to starting additional psychotropic medications that could contribute to metabolic syndrome. As patient's primary concern today remains difficulty sleeping due to racing thoughts and nightmares (as well patient with elevated blood pressure in office today), we will continue clonidine and add hydroxyzine 50 mg nightly as needed, to which patient is agreeable.  Patient advised to have labs drawn as soon as possible, follow-up with PCP, and will follow-up with this physician 4-6 weeks from today.   #Bipolar affective disorder #PTSD - Continue clonidine 0.1 mg twice daily - Start hydroxyzine 50 mg nightly.  Patient with GFR greater than 60 03/2022, so no need to renally dose at this time.  Will obtain labs to ensure no  changes.   #T2DM #Dyslipidemia #Essential hypertension - Labs pending: TSH, vitamin D, hemoglobin A1c, CMP, and lipid profile - Advised PCP follow-up for blood pressure  management   Collaboration of Care: Primary Care Provider AEB patient has PCP and is compliant with follow-ups and Psychiatrist AEB this physician provides psychiatric care   PCP: Dorena Dew, FNP  Referral Source: Dorena Dew, FNP  Referral Phone: [phone]  Referral Fax: [phone]   Patient/Guardian was advised Release of Information must be obtained prior to any record release in order to collaborate their care with an outside provider. Patient/Guardian was advised if they have not already done so to contact the registration department to sign all necessary forms in order for [place] to release information regarding their care.   Consent: Patient/Guardian gives verbal consent for treatment and assignment of benefits for services provided during this visit. Patient/Guardian expressed understanding and agreed to proceed.   Rosezetta Schlatter, MD Psych Resident, PGY-2 06/08/2022, 4:24 PM

## 2022-07-06 ENCOUNTER — Ambulatory Visit (HOSPITAL_COMMUNITY): Payer: Self-pay | Admitting: Mental Health

## 2022-07-20 ENCOUNTER — Encounter (HOSPITAL_COMMUNITY): Payer: Self-pay | Admitting: Student

## 2022-07-26 DIAGNOSIS — E119 Type 2 diabetes mellitus without complications: Secondary | ICD-10-CM | POA: Diagnosis not present

## 2022-07-26 DIAGNOSIS — H2513 Age-related nuclear cataract, bilateral: Secondary | ICD-10-CM | POA: Diagnosis not present

## 2022-07-26 LAB — HM DIABETES EYE EXAM

## 2022-08-16 ENCOUNTER — Encounter: Payer: Self-pay | Admitting: Pharmacist

## 2022-08-23 ENCOUNTER — Ambulatory Visit (INDEPENDENT_AMBULATORY_CARE_PROVIDER_SITE_OTHER): Payer: Self-pay | Admitting: Family Medicine

## 2022-08-23 ENCOUNTER — Other Ambulatory Visit: Payer: Self-pay

## 2022-08-23 ENCOUNTER — Encounter: Payer: Self-pay | Admitting: Family Medicine

## 2022-08-23 VITALS — BP 169/88 | HR 75 | Temp 97.2°F | Wt 151.8 lb

## 2022-08-23 DIAGNOSIS — I10 Essential (primary) hypertension: Secondary | ICD-10-CM | POA: Diagnosis not present

## 2022-08-23 DIAGNOSIS — F341 Dysthymic disorder: Secondary | ICD-10-CM

## 2022-08-23 DIAGNOSIS — E1169 Type 2 diabetes mellitus with other specified complication: Secondary | ICD-10-CM

## 2022-08-23 DIAGNOSIS — E785 Hyperlipidemia, unspecified: Secondary | ICD-10-CM

## 2022-08-23 DIAGNOSIS — E119 Type 2 diabetes mellitus without complications: Secondary | ICD-10-CM | POA: Diagnosis not present

## 2022-08-23 LAB — POCT URINALYSIS DIPSTICK
Bilirubin, UA: NEGATIVE
Glucose, UA: POSITIVE — AB
Ketones, UA: NEGATIVE
Leukocytes, UA: NEGATIVE
Nitrite, UA: NEGATIVE
Protein, UA: NEGATIVE
Spec Grav, UA: 1.025 (ref 1.010–1.025)
Urobilinogen, UA: 0.2 E.U./dL
pH, UA: 5.5 (ref 5.0–8.0)

## 2022-08-23 LAB — POCT GLYCOSYLATED HEMOGLOBIN (HGB A1C): Hemoglobin A1C: 10.4 % — AB (ref 4.0–5.6)

## 2022-08-23 MED ORDER — METFORMIN HCL 1000 MG PO TABS: 1000.0000 mg | ORAL_TABLET | Freq: Two times a day (BID) | ORAL | 2 refills | Status: DC

## 2022-08-23 MED ORDER — SITAGLIPTIN PHOSPHATE 25 MG PO TABS: 25.0000 mg | ORAL_TABLET | Freq: Every day | ORAL | 2 refills | Status: DC

## 2022-08-23 NOTE — Patient Instructions (Addendum)
Practice good sleep hygiene.  Stick to a sleep schedule, even on weekends. Exercise is great, but not too late in the day Avoid alcoholic drinks before bed Avoid large meals and beverages late before bed Don't take naps after 3 pm. Keep power naps less than 1 hour.  Relax before bed.  Take a hot bath before bed.  Have a good sleeping environment. Get rid of anything in your bedroom that might distract you from sleep.  Adopt good sleeping posture.  Sitagliptin Tablets What is this medication? SITAGLIPTIN (sit a GLIP tin) treats type 2 diabetes. It works by increasing insulin levels in your body, which decreases your blood sugar (glucose). It also reduces the amount of sugar released into your blood. Changes to diet and exercise are often combined with this medication. This medicine may be used for other purposes; ask your health care provider or pharmacist if you have questions. COMMON BRAND NAME(S): Januvia What should I tell my care team before I take this medication? They need to know if you have any of these conditions: Diabetic ketoacidosis Kidney disease Pancreatitis Previous swelling of the tongue, face, or lips with trouble breathing, trouble swallowing, hoarseness, or tightening of the throat Type 1 diabetes An unusual or allergic reaction to sitagliptin, other medications, foods, dyes, or preservatives Pregnant or trying to get pregnant Breastfeeding How should I use this medication? Take this medication by mouth with water. Take it as directed on the prescription label at the same time every day. Do not cut, crush, or chew this medication. Swallow the tablets whole. You can take it with or without food. Do not take it more often than directed. Keep taking it unless your care team tells you to stop. A special MedGuide will be given to you by the pharmacist with each prescription and refill. Be sure to read this information carefully each time. Talk to your care team about the use  of this medication in children. It is not approved for use in children. Overdosage: If you think you have taken too much of this medicine contact a poison control center or emergency room at once. NOTE: This medicine is only for you. Do not share this medicine with others. What if I miss a dose? If you miss a dose, take it as soon as you can. If it is almost time for your next dose, take only that dose. Do not take double or extra doses. What may interact with this medication? Do not take this medication with any of the following: Gatifloxacin This medication may also interact with the following: Alcohol Digoxin Insulin Sulfonylureas, such as glimepiride, glipizide, glyburide This list may not describe all possible interactions. Give your health care provider a list of all the medicines, herbs, non-prescription drugs, or dietary supplements you use. Also tell them if you smoke, drink alcohol, or use illegal drugs. Some items may interact with your medicine. What should I watch for while using this medication? Visit your care team for regular checks on your progress. A test called the HbA1C (A1C) will be monitored. This is a simple blood test. It measures your blood sugar control over the last 2 to 3 months. You will receive this test every 3 to 6 months. Learn how to check your blood sugar. Learn the symptoms of low and high blood sugar and how to manage them. Always carry a quick-source of sugar with you in case you have symptoms of low blood sugar. Examples include hard sugar candy or glucose tablets.  Make sure others know that you can choke if you eat or drink when you develop serious symptoms of low blood sugar, such as seizures or unconsciousness. They must get medical help at once. Tell your care team if you have high blood sugar. You might need to change the dose of your medication. If you are sick or exercising more than usual, you might need to change the dose of your medication. Do not  skip meals. Ask your care team if you should avoid alcohol. Many nonprescription cough and cold products contain sugar or alcohol. These can affect blood sugar. Wear a medical ID bracelet or chain, and carry a card that describes your disease and details of your medication and dosage times. What side effects may I notice from receiving this medication? Side effects that you should report to your care team as soon as possible: Allergic reactions--skin rash, itching, hives, swelling of the face, lips, tongue, or throat Heart failure--shortness of breath, swelling of the ankles, feet, or hands, sudden weight gain, unusual weakness or fatigue Kidney injury--decrease in the amount of urine, swelling of the ankles, hands, or feet Pancreatitis--severe stomach pain that spreads to your back or gets worse after eating or when touched, fever, nausea, vomiting Redness, blistering, peeling or loosening of the skin, including inside the mouth Severe joint pain Side effects that usually do not require medical attention (report to your care team if they continue or are bothersome): Headache Runny or stuffy nose Sore throat This list may not describe all possible side effects. Call your doctor for medical advice about side effects. You may report side effects to FDA at 1-800-FDA-1088. Where should I keep my medication? Keep out of the reach of children. Store at room temperature between 15 and 30 degrees C (59 and 86 degrees F). Throw away any unused medication after the expiration date. NOTE: This sheet is a summary. It may not cover all possible information. If you have questions about this medicine, talk to your doctor, pharmacist, or health care provider.  2023 Elsevier/Gold Standard (2020-09-07 00:00:00)

## 2022-08-23 NOTE — Progress Notes (Signed)
Established Patient Office Visit  Subjective   Patient ID: [name], female    DOB: April 02, 1971  Age: 51 y.o. MRN: [id number]     Jessica Garrett is a 52 year old female with a medical history significant for type 2 diabetes mellitus, uncontrolled, hypertension, hyperlipidemia, depression, and anxiety presents for follow-up of her normal conditions.  Patient has not been taking medications over the past month or so.  She does not have a reason for stopping medications, she just simply "did not feel like it".  Patient has not been checking blood glucose consistently.  She states that she only eats 1 time a day.  She reports that she has not been able to sleep consistently.  She is not taking antidepressant medications.  She currently denies any suicidal or homicidal intentions.  She denies any increased depression. Patient has a history of type 2 diabetes mellitus.  She was diagnosed with diabetes greater than 1 year ago.  Patient was previously on Lantus and metformin.  Lantus was discontinued when patient's condition improved.  Unfortunately, patient has not been taking her medications over the past several months.  She is also not following a carbohydrate modified diet.  Patient does not exercise.  She reports polyuria.  Denies polydipsia or polyphagia.  No dizziness or blurry vision.  Patient states that her most recent eye exam was 1 month ago. Jessica Garrett has a history of hypertension.  She does not check blood pressure at home.  Has not had medication for greater than 1 month.  She denies any headache, chest pain, heart palpitations, or blurry vision.    Patient Active Problem List   Diagnosis Date Noted   Rhinosinusitis 04/18/2022   Eye pain, bilateral 04/18/2022   Pyelonephritis 04/18/2022   Type 2 diabetes mellitus without complication, without long-term current use of insulin 04/18/2022   Current mild episode of major depressive disorder without prior episode 05/19/2020   Right foot pain  09/10/2019   Generalized anxiety disorder 05/01/2017   Chronic right shoulder pain 09/26/2016   Essential hypertension 05/05/2016   Past Medical History:  Diagnosis Date   Anemia    only with pregnancy   Diabetes mellitus without complication (HCC)    GERD (gastroesophageal reflux disease)    Headache    " I just go to bed when I have a headache"   Hypertension    Past Surgical History:  Procedure Laterality Date   CHOLECYSTECTOMY N/A 04/26/2016   Procedure: LAPAROSCOPIC CHOLECYSTECTOMY;  Surgeon: Axel Filler, MD;  Location: [facility] OR;  Service: General;  Laterality: N/A;   TUBAL LIGATION  1992   VAGINAL DELIVERY     x3   WISDOM TOOTH EXTRACTION     only on the R side , pt. reports that she was a wake for the procedure    Social History   Tobacco Use   Smoking status: Never   Smokeless tobacco: Never  Vaping Use   Vaping Use: Never used  Substance Use Topics   Alcohol use: Yes    Comment: occasional   Drug use: No   Social History   Socioeconomic History   Marital status: Single    Spouse name: Not on file   Number of children: Not on file   Years of education: Not on file   Highest education level: Not on file  Occupational History   Not on file  Tobacco Use   Smoking status: Never   Smokeless tobacco: Never  Vaping Use   Vaping Use: Never  used  Substance and Sexual Activity   Alcohol use: Yes    Comment: occasional   Drug use: No   Sexual activity: Yes    Birth control/protection: None  Other Topics Concern   Not on file  Social History Narrative   Pt stated she is a social drinker   Social Determinants of Corporate investment banker Strain: Not on file  Food Insecurity: Not on file  Transportation Needs: Not on file  Physical Activity: Not on file  Stress: Not on file  Social Connections: Not on file  Intimate Partner Violence: Not on file   Family Status  Relation Name Status   Mother  Deceased   Father  (Not Specified)   Brother  (Not  Specified)   MGM  (Not Specified)   Neg Hx  (Not Specified)   Family History  Problem Relation Age of Onset   Heart disease Mother    Colon cancer Father    Hypertension Father    Diabetes Brother    Hypertension Brother    Diabetes Maternal Grandmother    Colon polyps Neg Hx    Esophageal cancer Neg Hx    Rectal cancer Neg Hx    Stomach cancer Neg Hx    No Known Allergies    Review of Systems  Constitutional: Negative.   HENT: Negative.    Eyes: Negative.   Respiratory: Negative.    Cardiovascular: Negative.   Gastrointestinal: Negative.   Genitourinary: Negative.   Musculoskeletal: Negative.   Skin: Negative.   Neurological: Negative.   Endo/Heme/Allergies:  Negative for polydipsia.  Psychiatric/Behavioral: Negative.        Objective:     LMP  (LMP Unknown)  BP Readings from Last 3 Encounters:  04/18/22 130/68  04/08/22 110/61  02/22/22 (!) 176/80   Wt Readings from Last 3 Encounters:  04/18/22 146 lb 3.2 oz (66.3 kg)  04/08/22 150 lb (68 kg)  02/22/22 147 lb 3.2 oz (66.8 kg)      Physical Exam Constitutional:      Appearance: Normal appearance.  Eyes:     Pupils: Pupils are equal, round, and reactive to light.  Cardiovascular:     Rate and Rhythm: Normal rate and regular rhythm.     Pulses: Normal pulses.  Pulmonary:     Effort: Pulmonary effort is normal.  Abdominal:     General: Bowel sounds are normal.  Musculoskeletal:        General: Normal range of motion.  Skin:    General: Skin is warm.  Neurological:     General: No focal deficit present.     Mental Status: She is alert. Mental status is at baseline.  Psychiatric:        Mood and Affect: Mood normal. Affect is flat.        Behavior: Behavior is withdrawn.        Thought Content: Thought content normal. Thought content does not include homicidal or suicidal ideation.        Judgment: Judgment normal.      No results found for any visits on 08/23/22.  Last CBC Lab Results   Component Value Date   WBC 13.9 (H) 04/08/2022   HGB 14.1 04/08/2022   HCT 44.2 04/08/2022   MCV 96.1 04/08/2022   MCH 30.7 04/08/2022   RDW 13.3 04/08/2022   PLT 329 04/08/2022   Last metabolic panel Lab Results  Component Value Date   GLUCOSE 128 (H) 04/08/2022   NA 135  04/08/2022   K 3.9 04/08/2022   CL 100 04/08/2022   CO2 26 04/08/2022   BUN 13 04/08/2022   CREATININE 0.85 04/08/2022   GFRNONAA >60 04/08/2022   CALCIUM 9.8 04/08/2022   PROT 7.9 04/08/2022   ALBUMIN 4.2 04/08/2022   LABGLOB 3.3 04/21/2020   AGRATIO 1.5 04/21/2020   BILITOT 0.6 04/08/2022   ALKPHOS 77 04/08/2022   AST 18 04/08/2022   ALT 19 04/08/2022   ANIONGAP 9 04/08/2022   Last lipids Lab Results  Component Value Date   CHOL 200 (H) 02/22/2022   HDL 66 02/22/2022   LDLCALC 123 (H) 02/22/2022   TRIG 62 02/22/2022   CHOLHDL 3.0 02/22/2022   Last hemoglobin A1c Lab Results  Component Value Date   HGBA1C 6.8 02/22/2022   Last thyroid functions Lab Results  Component Value Date   TSH 1.500 05/01/2017   Last vitamin D No results found for: "25OHVITD2", "25OHVITD3", "VD25OH" Last vitamin B12 and Folate No results found for: "VITAMINB12", "FOLATE"    The 10-year ASCVD risk score (Arnett DK, et al., 2019) is: 21.7%    Assessment & Plan:   Problem List Items Addressed This Visit       Cardiovascular and Mediastinum   Essential hypertension - Primary   Relevant Orders   Urinalysis Dipstick   CMP and Liver   Lipid Panel     Endocrine   Type 2 diabetes mellitus without complication, without long-term current use of insulin   Relevant Orders   HgB A1c   CMP and Liver   Essential hypertension BP (!) 169/88   Pulse 75   Temp (!) 97.2 F (36.2 C)   Wt 151 lb 12.8 oz (68.9 kg)   LMP  (LMP Unknown)   SpO2 100%   BMI 26.06 kg/m  Patient has not taken antihypertensive medications in greater than 1 month.  Discussed the importance of taking medication consistently in order to  achieve positive outcomes.  Patient says that she is not out of this medication, but has agreed to restart medication.  She will restart amlodipine 5 mg daily. - Urinalysis Dipstick - CMP and Liver - Lipid Panel  Type 2 diabetes mellitus without complication, without long-term current use of insulin Today, patient's hemoglobin has increased to 10.1, which is very much increased from previous.  Patient has not been taking metformin.  Will restart metformin and start Januvia 25 mg daily.  Follow-up in 3 months to repeat A1c. - HgB A1c - CMP and Liver - metFORMIN (GLUCOPHAGE) 1000 MG tablet; Take 1 tablet (1,000 mg total) by mouth 2 (two) times daily with a meal.  Dispense: 180 tablet; Refill: 2 - sitaGLIPtin (JANUVIA) 25 MG tablet; Take 1 tablet (25 mg total) by mouth daily.  Dispense: 180 tablet; Refill: 2   Dysthymia Today, patient's affect is flat, very withdrawn.  She has discontinued all antidepressants.  She currently denies any suicidal homicidal intentions.  PHQ-9 elevated.  Recommend restarting with therapist.  Patient refuses psychology/psychiatry.  Patient will be referred to Abigail ButtsSusan Porter, LCSW for further evaluation.   Hyperlipidemia associated with type 2 diabetes mellitus Review lipid panel as results become available  Return in about 3 months (around 11/22/2022) for diabetes.    Nolon NationsLachina Moore Jehu Mccauslin  APRN, MSN, FNP-C Patient Care [facility] [address]  [place], [place] [id number] [phone]

## 2022-08-24 ENCOUNTER — Other Ambulatory Visit: Payer: Self-pay

## 2022-08-24 LAB — LIPID PANEL
Chol/HDL Ratio: 3.8 ratio (ref 0.0–4.4)
Cholesterol, Total: 248 mg/dL — ABNORMAL HIGH (ref 100–199)
HDL: 65 mg/dL (ref >39)
LDL Chol Calc (NIH): 163 mg/dL — ABNORMAL HIGH (ref 0–99)
Triglycerides: 114 mg/dL (ref 0–149)
VLDL Cholesterol Cal: 20 mg/dL (ref 5–40)

## 2022-08-24 LAB — CMP AND LIVER
ALT: 21 IU/L (ref 0–32)
AST: 16 IU/L (ref 0–40)
Albumin: 4.4 g/dL (ref 3.8–4.9)
Alkaline Phosphatase: 120 IU/L (ref 44–121)
BUN: 15 mg/dL (ref 6–24)
Bilirubin Total: 0.4 mg/dL (ref 0.0–1.2)
Bilirubin, Direct: 0.13 mg/dL (ref 0.00–0.40)
CO2: 18 mmol/L — ABNORMAL LOW (ref 20–29)
Calcium: 9.8 mg/dL (ref 8.7–10.2)
Chloride: 100 mmol/L (ref 96–106)
Creatinine, Ser: 0.73 mg/dL (ref 0.57–1.00)
Glucose: 289 mg/dL — ABNORMAL HIGH (ref 70–99)
Potassium: 4.4 mmol/L (ref 3.5–5.2)
Sodium: 136 mmol/L (ref 134–144)
Total Protein: 7.5 g/dL (ref 6.0–8.5)
eGFR: 98 mL/min/{1.73_m2} (ref >59)

## 2022-11-22 ENCOUNTER — Ambulatory Visit (INDEPENDENT_AMBULATORY_CARE_PROVIDER_SITE_OTHER): Payer: 59 | Admitting: Family Medicine

## 2022-11-22 VITALS — BP 135/82 | HR 75 | Ht 64.0 in | Wt 147.4 lb

## 2022-11-22 DIAGNOSIS — I10 Essential (primary) hypertension: Secondary | ICD-10-CM

## 2022-11-22 DIAGNOSIS — E119 Type 2 diabetes mellitus without complications: Secondary | ICD-10-CM | POA: Diagnosis not present

## 2022-11-22 DIAGNOSIS — F411 Generalized anxiety disorder: Secondary | ICD-10-CM

## 2022-11-22 DIAGNOSIS — E785 Hyperlipidemia, unspecified: Secondary | ICD-10-CM

## 2022-11-22 DIAGNOSIS — E1169 Type 2 diabetes mellitus with other specified complication: Secondary | ICD-10-CM | POA: Diagnosis not present

## 2022-11-22 LAB — POCT GLYCOSYLATED HEMOGLOBIN (HGB A1C): Hemoglobin A1C: 7.3 % — AB (ref 4.0–5.6)

## 2022-11-22 NOTE — Progress Notes (Signed)
Established Patient Office Visit  Subjective   Patient ID: Jessica Garrett, female    DOB: 10-28-1970  Age: 52 y.o. MRN: [id number]  Chief Complaint  Patient presents with   Follow-up    Jessica Garrett is a pleasant 52 year old female with a medical history significant for type 2 diabetes mellitus, hypertension, and hyperlipidemia that presents for follow-up of her chronic conditions.  Patient states that she is doing well and is without complaint on today.  She has been following a low carbohydrate diet and has remained active.  Patient denies any dizziness, blurry vision, polyuria, polydipsia, or polyphagia.  She does not check blood glucose at home. Patient also has a history of hypertension.  She does not check her blood pressure at home.  She has been taking all prescribed medications consistently.  She denies any lower extremity swelling, chest pain, orthopnea, or shortness of breath.    Patient Active Problem List   Diagnosis Date Noted   Rhinosinusitis 04/18/2022   Eye pain, bilateral 04/18/2022   Pyelonephritis 04/18/2022   Type 2 diabetes mellitus without complication, without long-term current use of insulin (HCC) 04/18/2022   Current mild episode of major depressive disorder without prior episode (HCC) 05/19/2020   Right foot pain 09/10/2019   Generalized anxiety disorder 05/01/2017   Chronic right shoulder pain 09/26/2016   Essential hypertension 05/05/2016   Past Medical History:  Diagnosis Date   Anemia    only with pregnancy   Diabetes mellitus without complication (HCC)    GERD (gastroesophageal reflux disease)    Headache    " I just go to bed when I have a headache"   Hypertension    Past Surgical History:  Procedure Laterality Date   CHOLECYSTECTOMY N/A 04/26/2016   Procedure: LAPAROSCOPIC CHOLECYSTECTOMY;  Surgeon: Axel Filler, MD;  Location: [facility] OR;  Service: General;  Laterality: N/A;   TUBAL LIGATION  1992   VAGINAL DELIVERY     x3   WISDOM TOOTH  EXTRACTION     only on the R side , pt. reports that she was a wake for the procedure    Social History   Tobacco Use   Smoking status: Never   Smokeless tobacco: Never  Vaping Use   Vaping status: Never Used  Substance Use Topics   Alcohol use: Yes    Comment: occasional   Drug use: No   Social History   Socioeconomic History   Marital status: Single    Spouse name: Not on file   Number of children: Not on file   Years of education: Not on file   Highest education level: Not on file  Occupational History   Not on file  Tobacco Use   Smoking status: Never   Smokeless tobacco: Never  Vaping Use   Vaping status: Never Used  Substance and Sexual Activity   Alcohol use: Yes    Comment: occasional   Drug use: No   Sexual activity: Yes    Birth control/protection: None  Other Topics Concern   Not on file  Social History Narrative   Pt stated she is a social drinker   Social Determinants of Corporate investment banker Strain: Not on file  Food Insecurity: Not on file  Transportation Needs: Not on file  Physical Activity: Not on file  Stress: Not on file  Social Connections: Not on file  Intimate Partner Violence: Not on file   Family Status  Relation Name Status   Mother  Deceased  Father  (Not Specified)   Brother  (Not Specified)   MGM  (Not Specified)   Neg Hx  (Not Specified)  No partnership data on file   Family History  Problem Relation Age of Onset   Heart disease Mother    Colon cancer Father    Hypertension Father    Diabetes Brother    Hypertension Brother    Diabetes Maternal Grandmother    Colon polyps Neg Hx    Esophageal cancer Neg Hx    Rectal cancer Neg Hx    Stomach cancer Neg Hx    No Known Allergies    ROS    Objective:     BP 135/82   Pulse 75   Ht 5\' 4"  (1.626 m)   Wt 147 lb 6.4 oz (66.9 kg)   LMP  (LMP Unknown)   SpO2 98%   BMI 25.30 kg/m  BP Readings from Last 3 Encounters:  11/22/22 135/82  08/23/22 (!)  169/88  04/18/22 130/68   Wt Readings from Last 3 Encounters:  11/22/22 147 lb 6.4 oz (66.9 kg)  08/23/22 151 lb 12.8 oz (68.9 kg)  04/18/22 146 lb 3.2 oz (66.3 kg)      Physical Exam Constitutional:      Appearance: Normal appearance.  Eyes:     Pupils: Pupils are equal, round, and reactive to light.  Cardiovascular:     Rate and Rhythm: Normal rate and regular rhythm.  Pulmonary:     Effort: Pulmonary effort is normal.     Breath sounds: No rales.  Abdominal:     General: Bowel sounds are normal.  Musculoskeletal:        General: Normal range of motion.  Skin:    General: Skin is warm.  Neurological:     General: No focal deficit present.     Mental Status: She is alert. Mental status is at baseline.  Psychiatric:        Mood and Affect: Mood normal.        Behavior: Behavior normal.        Thought Content: Thought content normal.        Judgment: Judgment normal.      Results for orders placed or performed in visit on 11/22/22  Basic Metabolic Panel  Result Value Ref Range   Glucose 150 (H) 70 - 99 mg/dL   BUN 14 6 - 24 mg/dL   Creatinine, Ser 1.61 0.57 - 1.00 mg/dL   eGFR 96 >09 UE/AVW/0.98   BUN/Creatinine Ratio 19 9 - 23   Sodium 136 134 - 144 mmol/L   Potassium 4.2 3.5 - 5.2 mmol/L   Chloride 99 96 - 106 mmol/L   CO2 16 (L) 20 - 29 mmol/L   Calcium 10.2 8.7 - 10.2 mg/dL  POCT glycosylated hemoglobin (Hb A1C)  Result Value Ref Range   Hemoglobin A1C 7.3 (A) 4.0 - 5.6 %   HbA1c POC (<> result, manual entry)     HbA1c, POC (prediabetic range)     HbA1c, POC (controlled diabetic range)      Last CBC Lab Results  Component Value Date   WBC 13.9 (H) 04/08/2022   HGB 14.1 04/08/2022   HCT 44.2 04/08/2022   MCV 96.1 04/08/2022   MCH 30.7 04/08/2022   RDW 13.3 04/08/2022   PLT 329 04/08/2022   Last metabolic panel Lab Results  Component Value Date   GLUCOSE 150 (H) 11/22/2022   NA 136 11/22/2022   K 4.2 11/22/2022  CL 99 11/22/2022   CO2 16  (L) 11/22/2022   BUN 14 11/22/2022   CREATININE 0.74 11/22/2022   EGFR 96 11/22/2022   CALCIUM 10.2 11/22/2022   PROT 7.5 08/23/2022   ALBUMIN 4.4 08/23/2022   LABGLOB 3.3 04/21/2020   AGRATIO 1.5 04/21/2020   BILITOT 0.4 08/23/2022   ALKPHOS 120 08/23/2022   AST 16 08/23/2022   ALT 21 08/23/2022   ANIONGAP 9 04/08/2022   Last lipids Lab Results  Component Value Date   CHOL 248 (H) 08/23/2022   HDL 65 08/23/2022   LDLCALC 163 (H) 08/23/2022   TRIG 114 08/23/2022   CHOLHDL 3.8 08/23/2022   Last hemoglobin A1c Lab Results  Component Value Date   HGBA1C 7.3 (A) 11/22/2022   Last thyroid functions Lab Results  Component Value Date   TSH 1.500 05/01/2017   Last vitamin D No results found for: "25OHVITD2", "25OHVITD3", "VD25OH" Last vitamin B12 and Folate No results found for: "VITAMINB12", "FOLATE"    The 10-year ASCVD risk score (Arnett DK, et al., 2019) is: 13.1%    Assessment & Plan:   Problem List Items Addressed This Visit       Cardiovascular and Mediastinum   Essential hypertension   Relevant Orders   Basic Metabolic Panel (Completed)     Endocrine   Type 2 diabetes mellitus without complication, without long-term current use of insulin (HCC) - Primary   Relevant Orders   POCT glycosylated hemoglobin (Hb A1C) (Completed)   Basic Metabolic Panel (Completed)     Other   Generalized anxiety disorder   Other Visit Diagnoses     Hyperlipidemia associated with type 2 diabetes mellitus (HCC)          1. Type 2 diabetes mellitus without complication, without long-term current use of insulin (HCC) Hemoglobin A1c is much improved from previous.  Will continue current medication regimen.  Patient advised to continue to follow a carbohydrate modified diet divided over small meals - POCT glycosylated hemoglobin (Hb A1C) - Basic Metabolic Panel  2. Essential hypertension BP 135/82   Pulse 75   Ht 5\' 4"  (1.626 m)   Wt 147 lb 6.4 oz (66.9 kg)   LMP   (LMP Unknown)   SpO2 98%   BMI 25.30 kg/m  Blood pressure is at goal.  No medication changes today. - Basic Metabolic Panel  3. Hyperlipidemia associated with type 2 diabetes mellitus (HCC) The 10-year ASCVD risk score (Arnett DK, et al., 2019) is: 13.1%   Values used to calculate the score:     Age: 29 years     Sex: Female     Is Non-Hispanic African American: Yes     Diabetic: Yes     Tobacco smoker: No     Systolic Blood Pressure: 135 mmHg     Is BP treated: Yes     HDL Cholesterol: 65 mg/dL     Total Cholesterol: 248 mg/dL   4. Generalized anxiety disorder Stable.  No medication changes.  Return in about 3 months (around 02/22/2023) for chronic cough, gynecological examination.     Nolon Nations  APRN, MSN, FNP-C Patient Care [facility] [address] [place], [place] [address] [phone]

## 2022-11-23 LAB — BASIC METABOLIC PANEL
BUN/Creatinine Ratio: 19 (ref 9–23)
BUN: 14 mg/dL (ref 6–24)
CO2: 16 mmol/L — ABNORMAL LOW (ref 20–29)
Calcium: 10.2 mg/dL (ref 8.7–10.2)
Chloride: 99 mmol/L (ref 96–106)
Creatinine, Ser: 0.74 mg/dL (ref 0.57–1.00)
Glucose: 150 mg/dL — ABNORMAL HIGH (ref 70–99)
Potassium: 4.2 mmol/L (ref 3.5–5.2)
Sodium: 136 mmol/L (ref 134–144)
eGFR: 96 mL/min/{1.73_m2} (ref >59)

## 2022-12-20 ENCOUNTER — Ambulatory Visit
Admission: RE | Admit: 2022-12-20 | Discharge: 2022-12-20 | Disposition: A | Discharge status: Home / Self Care | Payer: 59 | Source: Ambulatory Visit | Attending: Family Medicine | Admitting: Family Medicine

## 2022-12-20 ENCOUNTER — Inpatient Hospital Stay: Admission: RE | Admit: 2022-12-20 | Payer: 59 | Source: Ambulatory Visit

## 2022-12-20 DIAGNOSIS — Z1231 Encounter for screening mammogram for malignant neoplasm of breast: Secondary | ICD-10-CM | POA: Diagnosis not present

## 2023-02-21 ENCOUNTER — Encounter: Payer: Self-pay | Admitting: Family Medicine

## 2023-02-21 ENCOUNTER — Ambulatory Visit (INDEPENDENT_AMBULATORY_CARE_PROVIDER_SITE_OTHER): Payer: 59 | Admitting: Family Medicine

## 2023-02-21 VITALS — BP 153/86 | HR 76 | Wt 150.4 lb

## 2023-02-21 DIAGNOSIS — E1169 Type 2 diabetes mellitus with other specified complication: Secondary | ICD-10-CM

## 2023-02-21 DIAGNOSIS — I10 Essential (primary) hypertension: Secondary | ICD-10-CM | POA: Diagnosis not present

## 2023-02-21 DIAGNOSIS — E785 Hyperlipidemia, unspecified: Secondary | ICD-10-CM | POA: Diagnosis not present

## 2023-02-21 DIAGNOSIS — E11630 Type 2 diabetes mellitus with periodontal disease: Secondary | ICD-10-CM

## 2023-02-21 DIAGNOSIS — E119 Type 2 diabetes mellitus without complications: Secondary | ICD-10-CM | POA: Diagnosis not present

## 2023-02-21 LAB — POCT GLYCOSYLATED HEMOGLOBIN (HGB A1C): Hemoglobin A1C: 8.4 % — AB (ref 4.0–5.6)

## 2023-02-21 MED ORDER — AMLODIPINE BESYLATE 10 MG PO TABS: 10.0000 mg | ORAL_TABLET | Freq: Every day | ORAL | 1 refills | Status: DC

## 2023-02-21 MED ORDER — SITAGLIPTIN PHOSPHATE 25 MG PO TABS: 25.0000 mg | ORAL_TABLET | Freq: Every day | ORAL | 2 refills | Status: DC

## 2023-02-21 NOTE — Patient Instructions (Signed)
Referral to [name], LCSW for community resources  Continue medications without changes

## 2023-02-21 NOTE — Progress Notes (Signed)
Established Patient Office Visit  Subjective   Patient ID: Jessica Garrett, female    DOB: [birth year]  Age: 52 y.o. MRN: [id number]  Chief Complaint  Patient presents with   Diabetes   Medical Management of Chronic Issues     Jessica Garrett is a very pleasant 52 year old female with a medical history significant for type 2 diabetes mellitus, essential hypertension, and hyperlipidemia that presents for follow-up of chronic conditions.  Jessica Garrett has been doing very well and is without complaint on today. She does not check blood pressure or blood glucose at home. History of type 2 diabetes mellitus.  Patient denies any blurry vision, polyuria, polydipsia, or polyphagia.  She has not been following a carbohydrate modified diet or exercising. History of essential hypertension.  Patient did not take blood pressure medications prior to arrival.  She denies any chest pain, headache, orthopnea, or lower extremity swelling.    Patient Active Problem List   Diagnosis Date Noted   Rhinosinusitis 04/18/2022   Eye pain, bilateral 04/18/2022   Pyelonephritis 04/18/2022   Type 2 diabetes mellitus without complication, without long-term current use of insulin (HCC) 04/18/2022   Current mild episode of major depressive disorder without prior episode (HCC) 05/19/2020   Right foot pain 09/10/2019   Generalized anxiety disorder 05/01/2017   Chronic right shoulder pain 09/26/2016   Essential hypertension 05/05/2016   Past Medical History:  Diagnosis Date   Anemia    only with pregnancy   Diabetes mellitus without complication (HCC)    GERD (gastroesophageal reflux disease)    Headache    " I just go to bed when I have a headache"   Hypertension    Past Surgical History:  Procedure Laterality Date   CHOLECYSTECTOMY N/A 04/26/2016   Procedure: LAPAROSCOPIC CHOLECYSTECTOMY;  Surgeon: Axel Filler, MD;  Location: [facility] OR;  Service: General;  Laterality: N/A;   TUBAL LIGATION  1992   VAGINAL DELIVERY      x3   WISDOM TOOTH EXTRACTION     only on the R side , pt. reports that she was a wake for the procedure    Social History   Tobacco Use   Smoking status: Never   Smokeless tobacco: Never  Vaping Use   Vaping status: Never Used  Substance Use Topics   Alcohol use: Yes    Comment: occasional   Drug use: No   Social History   Socioeconomic History   Marital status: Single    Spouse name: Not on file   Number of children: Not on file   Years of education: Not on file   Highest education level: Not on file  Occupational History   Not on file  Tobacco Use   Smoking status: Never   Smokeless tobacco: Never  Vaping Use   Vaping status: Never Used  Substance and Sexual Activity   Alcohol use: Yes    Comment: occasional   Drug use: No   Sexual activity: Yes    Birth control/protection: None  Other Topics Concern   Not on file  Social History Narrative   Pt stated she is a social drinker   Social Determinants of Corporate investment banker Strain: Not on file  Food Insecurity: Not on file  Transportation Needs: Not on file  Physical Activity: Not on file  Stress: Not on file  Social Connections: Not on file  Intimate Partner Violence: Not on file   Family Status  Relation Name Status   Mother  Deceased  Father  (Not Specified)   MGM  (Not Specified)   Brother  (Not Specified)   Neg Hx  (Not Specified)  No partnership data on file   Family History  Problem Relation Age of Onset   Heart disease Mother    Colon cancer Father    Hypertension Father    Diabetes Maternal Grandmother    Diabetes Brother    Hypertension Brother    Colon polyps Neg Hx    Esophageal cancer Neg Hx    Rectal cancer Neg Hx    Stomach cancer Neg Hx    Breast cancer Neg Hx    No Known Allergies    Review of Systems  Constitutional: Negative.   HENT: Negative.    Eyes: Negative.   Cardiovascular:  Negative for chest pain, palpitations, orthopnea, claudication, leg swelling  and PND.  Gastrointestinal: Negative.   Genitourinary: Negative.   Musculoskeletal: Negative.   Skin: Negative.   Endo/Heme/Allergies: Negative.   Psychiatric/Behavioral: Negative.        Objective:     BP (!) 153/86   Pulse 76   Wt 150 lb 6.4 oz (68.2 kg)   LMP  (LMP Unknown)   SpO2 98%   BMI 25.82 kg/m  BP Readings from Last 3 Encounters:  02/21/23 (!) 153/86  11/22/22 135/82  08/23/22 (!) 169/88   Wt Readings from Last 3 Encounters:  02/21/23 150 lb 6.4 oz (68.2 kg)  11/22/22 147 lb 6.4 oz (66.9 kg)  08/23/22 151 lb 12.8 oz (68.9 kg)      Physical Exam Constitutional:      Appearance: Normal appearance.  Eyes:     Pupils: Pupils are equal, round, and reactive to light.  Cardiovascular:     Rate and Rhythm: Normal rate and regular rhythm.     Pulses: Normal pulses.  Pulmonary:     Effort: Pulmonary effort is normal.  Abdominal:     General: Bowel sounds are normal.      Results for orders placed or performed in visit on 02/21/23  POCT glycosylated hemoglobin (Hb A1C)  Result Value Ref Range   Hemoglobin A1C 8.4 (A) 4.0 - 5.6 %   HbA1c POC (<> result, manual entry)     HbA1c, POC (prediabetic range)     HbA1c, POC (controlled diabetic range)      Last CBC Lab Results  Component Value Date   WBC 13.9 (H) 04/08/2022   HGB 14.1 04/08/2022   HCT 44.2 04/08/2022   MCV 96.1 04/08/2022   MCH 30.7 04/08/2022   RDW 13.3 04/08/2022   PLT 329 04/08/2022   Last metabolic panel Lab Results  Component Value Date   GLUCOSE CANCELED 02/21/2023   NA CANCELED 02/21/2023   K CANCELED 02/21/2023   CL CANCELED 02/21/2023   CO2 CANCELED 02/21/2023   BUN CANCELED 02/21/2023   CREATININE CANCELED 02/21/2023   EGFR 96 11/22/2022   CALCIUM CANCELED 02/21/2023   PROT CANCELED 02/21/2023   ALBUMIN CANCELED 02/21/2023   LABGLOB CANCELED 02/21/2023   AGRATIO 1.5 04/21/2020   BILITOT CANCELED 02/21/2023   ALKPHOS CANCELED 02/21/2023   AST CANCELED 02/21/2023    ALT CANCELED 02/21/2023   ANIONGAP 9 04/08/2022   Last lipids Lab Results  Component Value Date   CHOL 248 (H) 08/23/2022   HDL 65 08/23/2022   LDLCALC 163 (H) 08/23/2022   TRIG 114 08/23/2022   CHOLHDL 3.8 08/23/2022   Last hemoglobin A1c Lab Results  Component Value Date   HGBA1C 8.4 (  A) 02/21/2023   Last thyroid functions Lab Results  Component Value Date   TSH 1.500 05/01/2017   Last vitamin D No results found for: "25OHVITD2", "25OHVITD3", "VD25OH" Last vitamin B12 and Folate No results found for: "VITAMINB12", "FOLATE"    The 10-year ASCVD risk score (Arnett DK, et al., 2019) is: 19.8%    Assessment & Plan:   Problem List Items Addressed This Visit       Endocrine   Type 2 diabetes mellitus without complication, without long-term current use of insulin (HCC) - Primary   Relevant Orders   Microalbumin/Creatinine Ratio, Urine   POCT glycosylated hemoglobin (Hb A1C) (Completed)  1. Type 2 diabetes mellitus without complication, without long-term current use of insulin (HCC) Patient's hemoglobin A1c has increased to 8.4 from 7.3 four months prior.  Will add sitagliptin 25 mg daily to current medication regimen.  Discussed at length.  Recommend carbohydrate modified diet divided over small meals throughout the day. - Microalbumin/Creatinine Ratio, Urine - POCT glycosylated hemoglobin (Hb A1C) - sitaGLIPtin (JANUVIA) 25 MG tablet; Take 1 tablet (25 mg total) by mouth daily.  Dispense: 180 tablet; Refill: 2 - Comprehensive metabolic panel - Urinalysis, Routine w reflex microscopic  2. Essential hypertension Discussed the importance of taking medications consistently in order to achieve positive outcomes.  Patient expressed understanding. - amLODipine (NORVASC) 10 MG tablet; Take 1 tablet (10 mg total) by mouth daily.  Dispense: 90 tablet; Refill: 1 - Comprehensive metabolic panel - Urinalysis, Routine w reflex microscopic  3. Hyperlipidemia associated with type 2  diabetes mellitus (HCC) The 10-year ASCVD risk score (Arnett DK, et al., 2019) is: 19.8%   Values used to calculate the score:     Age: 26 years     Sex: Female     Is Non-Hispanic African American: Yes     Diabetic: Yes     Tobacco smoker: No     Systolic Blood Pressure: 153 mmHg     Is BP treated: Yes     HDL Cholesterol: 65 mg/dL     Total Cholesterol: 248 mg/dL   4. 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  Recommend follow-up with a general dentist. - Ambulatory referral to Dentistry   Return in about 3 months (around 05/24/2023).   Nolon Nations  APRN, MSN, FNP-C [facility] [address] [place], [place] [address] [phone]

## 2023-02-22 LAB — COMPREHENSIVE METABOLIC PANEL

## 2023-02-22 LAB — MICROSCOPIC EXAMINATION
Casts: NONE SEEN /[LPF]
Epithelial Cells (non renal): >10 /[HPF] — AB (ref 0–10)
RBC, Urine: NONE SEEN /[HPF] (ref 0–2)

## 2023-02-22 LAB — URINALYSIS, ROUTINE W REFLEX MICROSCOPIC
Bilirubin, UA: NEGATIVE
Glucose, UA: NEGATIVE
Ketones, UA: NEGATIVE
Nitrite, UA: NEGATIVE
RBC, UA: NEGATIVE
Specific Gravity, UA: 1.022 (ref 1.005–1.030)
Urobilinogen, Ur: 0.2 mg/dL (ref 0.2–1.0)
pH, UA: 5.5 (ref 5.0–7.5)

## 2023-02-23 LAB — MICROALBUMIN / CREATININE URINE RATIO
Creatinine, Urine: 148 mg/dL
Microalb/Creat Ratio: 78 mg/g{creat} — ABNORMAL HIGH (ref 0–29)
Microalbumin, Urine: 116 ug/mL

## 2023-05-16 ENCOUNTER — Encounter: Payer: Self-pay | Admitting: Nurse Practitioner

## 2023-05-16 ENCOUNTER — Ambulatory Visit (INDEPENDENT_AMBULATORY_CARE_PROVIDER_SITE_OTHER): Payer: 59 | Admitting: Nurse Practitioner

## 2023-05-16 VITALS — BP 122/76 | HR 79 | Temp 97.4°F | Wt 159.0 lb

## 2023-05-16 DIAGNOSIS — E1165 Type 2 diabetes mellitus with hyperglycemia: Secondary | ICD-10-CM | POA: Diagnosis not present

## 2023-05-16 DIAGNOSIS — Z794 Long term (current) use of insulin: Secondary | ICD-10-CM

## 2023-05-16 DIAGNOSIS — E11630 Type 2 diabetes mellitus with periodontal disease: Secondary | ICD-10-CM | POA: Insufficient documentation

## 2023-05-16 DIAGNOSIS — Z1211 Encounter for screening for malignant neoplasm of colon: Secondary | ICD-10-CM

## 2023-05-16 MED ORDER — SITAGLIPTIN PHOSPHATE 25 MG PO TABS: 25.0000 mg | ORAL_TABLET | Freq: Every day | ORAL | 2 refills | Status: DC

## 2023-05-16 NOTE — Assessment & Plan Note (Addendum)
Lab Results  Component Value Date   HGBA1C 8.4 (A) 02/21/2023  Stated that she has been out of Januvia for about 3 weeks Januvia 25 mg daily refilled Continue metformin 1000 mg twice daily Patient counseled on low-carb diet Follow-up in 1 month

## 2023-05-16 NOTE — Progress Notes (Addendum)
 New Patient Office Visit  Subjective:  Patient ID: Jessica Garrett, female    DOB: 05/18/1970  Age: 52 y.o. MRN: [id number]  CC:  Chief Complaint  Patient presents with   Establish Care    Need referral      HPI Jessica Garrett is a 52 y.o. female  has a past medical history of Anemia, Diabetes mellitus without complication (HCC), GERD (gastroesophageal reflux disease), Headache, and Hypertension.   Patient presents to discuss referral to a periodontist.  She has chronic dental pain with bleeding, ongoing for over a year, stated that she uses a mouthwash that gives some relief.  She has seen dentist in the past and the recommended that she sees a biomedical scientist.  Her current insurance is not willing to pay more than 10% of the bill.  She is looking at getting a periodontist that be able to provide the care she needs and that is affordable    Past Medical History:  Diagnosis Date   Anemia    only with pregnancy   Diabetes mellitus without complication (HCC)    GERD (gastroesophageal reflux disease)    Headache     I just go to bed when I have a headache   Hypertension     Past Surgical History:  Procedure Laterality Date   CHOLECYSTECTOMY N/A 04/26/2016   Procedure: LAPAROSCOPIC CHOLECYSTECTOMY;  Surgeon: Lynda Leos, MD;  Location: [facility] OR;  Service: General;  Laterality: N/A;   TUBAL LIGATION  1992   VAGINAL DELIVERY     x3   WISDOM TOOTH EXTRACTION     only on the R side , pt. reports that she was a wake for the procedure     Family History  Problem Relation Age of Onset   Heart disease Mother    Colon cancer Father    Hypertension Father    Diabetes Maternal Grandmother    Diabetes Brother    Hypertension Brother    Colon polyps Neg Hx    Esophageal cancer Neg Hx    Rectal cancer Neg Hx    Stomach cancer Neg Hx    Breast cancer Neg Hx     Social History   Socioeconomic History   Marital status: Single    Spouse name: Not on file   Number of children:  Not on file   Years of education: Not on file   Highest education level: Not on file  Occupational History   Not on file  Tobacco Use   Smoking status: Never   Smokeless tobacco: Never  Vaping Use   Vaping status: Never Used  Substance and Sexual Activity   Alcohol use: Yes    Comment: occasional   Drug use: No   Sexual activity: Yes    Birth control/protection: None  Other Topics Concern   Not on file  Social History Narrative   Pt stated she is a social drinker   Social Drivers of Corporate Investment Banker Strain: Not on file  Food Insecurity: Not on file  Transportation Needs: Not on file  Physical Activity: Not on file  Stress: Not on file  Social Connections: Not on file  Intimate Partner Violence: Not on file    ROS Review of Systems  Constitutional:  Negative for appetite change, chills, fatigue and fever.  HENT:  Positive for dental problem. Negative for congestion, postnasal drip, rhinorrhea and sneezing.   Respiratory:  Negative for cough, shortness of breath and wheezing.   Cardiovascular:  Negative for  chest pain, palpitations and leg swelling.  Gastrointestinal:  Negative for abdominal pain, constipation, nausea and vomiting.  Genitourinary:  Negative for difficulty urinating, dysuria, flank pain and frequency.  Musculoskeletal:  Negative for arthralgias, back pain, joint swelling and myalgias.  Skin:  Negative for color change, pallor, rash and wound.  Neurological:  Negative for dizziness, facial asymmetry, weakness, numbness and headaches.  Psychiatric/Behavioral:  Negative for behavioral problems, confusion, self-injury and suicidal ideas.     Objective:   Today's Vitals: BP 122/76   Pulse 79   Temp (!) 97.4 F (36.3 C)   Wt 159 lb (72.1 kg)   LMP  (LMP Unknown)   SpO2 99%   BMI 27.29 kg/m   Physical Exam Vitals and nursing note reviewed.  Constitutional:      General: She is not in acute distress.    Appearance: Normal appearance. She  is not ill-appearing, toxic-appearing or diaphoretic.  HENT:     Mouth/Throat:     Mouth: Mucous membranes are moist.     Dentition: Abnormal dentition. Dental caries present. No dental abscesses.     Pharynx: Oropharynx is clear. No oropharyngeal exudate or posterior oropharyngeal erythema.  Eyes:     General: No scleral icterus.       Right eye: No discharge.        Left eye: No discharge.     Extraocular Movements: Extraocular movements intact.     Conjunctiva/sclera: Conjunctivae normal.  Cardiovascular:     Rate and Rhythm: Normal rate and regular rhythm.     Pulses: Normal pulses.     Heart sounds: Normal heart sounds. No murmur heard.    No friction rub. No gallop.  Pulmonary:     Effort: Pulmonary effort is normal. No respiratory distress.     Breath sounds: Normal breath sounds. No stridor. No wheezing, rhonchi or rales.  Chest:     Chest wall: No tenderness.  Abdominal:     General: There is no distension.     Palpations: Abdomen is soft.     Tenderness: There is no abdominal tenderness. There is no right CVA tenderness, left CVA tenderness or guarding.  Musculoskeletal:        General: No swelling, tenderness, deformity or signs of injury.     Right lower leg: No edema.     Left lower leg: No edema.  Skin:    General: Skin is warm and dry.     Capillary Refill: Capillary refill takes less than 2 seconds.     Coloration: Skin is not jaundiced or pale.     Findings: No bruising, erythema or lesion.  Neurological:     Mental Status: She is alert and oriented to person, place, and time.     Motor: No weakness.     Coordination: Coordination normal.     Gait: Gait normal.  Psychiatric:        Mood and Affect: Mood normal.        Behavior: Behavior normal.        Thought Content: Thought content normal.        Judgment: Judgment normal.     Assessment & Plan:   Problem List Items Addressed This Visit       Digestive   Periodontal disease due to type 2  diabetes mellitus (HCC)    - Ambulatory referral to Dentistry       Relevant Orders   Ambulatory referral to Dentistry     Endocrine   Uncontrolled  type 2 diabetes mellitus with hyperglycemia, with long-term current use of insulin (HCC) - Primary   Lab Results  Component Value Date   HGBA1C 8.4 (A) 02/21/2023  Stated that she has been out of Januvia  for about 3 weeks Januvia  25 mg daily refilled Continue metformin  1000 mg twice daily Patient counseled on low-carb diet Follow-up in 1 month      Other Visit Diagnoses       Screening for colon cancer       Relevant Orders   Cologuard       Outpatient Encounter Medications as of 05/16/2023  Medication Sig   atorvastatin  (LIPITOR) 20 MG tablet Take 1 tablet (20 mg total) by mouth daily.   fluticasone  (FLONASE ) 50 MCG/ACT nasal spray Place 2 sprays into both nostrils daily.   sertraline  (ZOLOFT ) 50 MG tablet Take 1 tablet (50 mg total) by mouth daily.   sodium chloride  (OCEAN) 0.65 % SOLN nasal spray Place 1 spray into both nostrils as needed for congestion.   [DISCONTINUED] amLODipine  (NORVASC ) 10 MG tablet Take 1 tablet (10 mg total) by mouth daily.   [DISCONTINUED] metFORMIN  (GLUCOPHAGE ) 1000 MG tablet Take 1 tablet (1,000 mg total) by mouth 2 (two) times daily with a meal.   PEG-KCl-NaCl-NaSulf-Na Asc-C (PLENVU ) 140 g SOLR Take 1 kit by mouth as directed. (Patient not taking: Reported on 02/22/2022)   [DISCONTINUED] sitaGLIPtin  (JANUVIA ) 25 MG tablet Take 1 tablet (25 mg total) by mouth daily. (Patient not taking: Reported on 05/16/2023)   [DISCONTINUED] sitaGLIPtin  (JANUVIA ) 25 MG tablet Take 1 tablet (25 mg total) by mouth daily.   No facility-administered encounter medications on file as of 05/16/2023.    Follow-up: Return in about 4 weeks (around 06/13/2023) for DM.   Miriana Gaertner R Yvone Slape, FNP

## 2023-05-16 NOTE — Assessment & Plan Note (Signed)
-   Ambulatory referral to Dentistry

## 2023-05-16 NOTE — Patient Instructions (Signed)
.   Type 2 diabetes mellitus without complication, without long-term current use of insulin (HCC)  - sitaGLIPtin  (JANUVIA ) 25 MG tablet; Take 1 tablet (25 mg total) by mouth daily.  Dispense: 180 tablet; Refill: 2   . 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 Choose  clean, green, fresh or frozen over canned, processed or packaged foods which are more sugary, salty and fatty. 70 to 75% of food eaten should be vegetables and fruit. Three meals at set times with snacks allowed between meals, but they must be fruit or vegetables. Aim to eat over a 12 hour period , example 7 am to 7 pm, and STOP after  your last meal of the day. Drink water,generally about 64 ounces per day, no other drink is as healthy. Fruit juice is best enjoyed in a healthy way, by EATING the fruit.  [facility] for choosing [facility] we consider it a privelige to serve you.

## 2023-05-26 ENCOUNTER — Encounter: Payer: Self-pay | Admitting: Pharmacist

## 2023-05-29 DIAGNOSIS — E785 Hyperlipidemia, unspecified: Secondary | ICD-10-CM | POA: Diagnosis not present

## 2023-05-29 DIAGNOSIS — Z809 Family history of malignant neoplasm, unspecified: Secondary | ICD-10-CM | POA: Diagnosis not present

## 2023-05-29 DIAGNOSIS — E11630 Type 2 diabetes mellitus with periodontal disease: Secondary | ICD-10-CM | POA: Diagnosis not present

## 2023-05-29 DIAGNOSIS — Z8249 Family history of ischemic heart disease and other diseases of the circulatory system: Secondary | ICD-10-CM | POA: Diagnosis not present

## 2023-05-29 DIAGNOSIS — I10 Essential (primary) hypertension: Secondary | ICD-10-CM | POA: Diagnosis not present

## 2023-05-29 DIAGNOSIS — Z008 Encounter for other general examination: Secondary | ICD-10-CM | POA: Diagnosis not present

## 2023-05-29 DIAGNOSIS — Z833 Family history of diabetes mellitus: Secondary | ICD-10-CM | POA: Diagnosis not present

## 2023-05-29 DIAGNOSIS — Z7984 Long term (current) use of oral hypoglycemic drugs: Secondary | ICD-10-CM | POA: Diagnosis not present

## 2023-05-29 DIAGNOSIS — F325 Major depressive disorder, single episode, in full remission: Secondary | ICD-10-CM | POA: Diagnosis not present

## 2023-05-29 DIAGNOSIS — Z5986 Financial insecurity: Secondary | ICD-10-CM | POA: Diagnosis not present

## 2023-05-29 DIAGNOSIS — F411 Generalized anxiety disorder: Secondary | ICD-10-CM | POA: Diagnosis not present

## 2023-05-30 ENCOUNTER — Ambulatory Visit: Payer: Self-pay | Admitting: Family Medicine

## 2023-06-13 ENCOUNTER — Ambulatory Visit (INDEPENDENT_AMBULATORY_CARE_PROVIDER_SITE_OTHER): Payer: 59 | Admitting: Nurse Practitioner

## 2023-06-13 ENCOUNTER — Encounter: Payer: Self-pay | Admitting: Nurse Practitioner

## 2023-06-13 VITALS — BP 136/73 | HR 87 | Temp 97.0°F | Wt 151.0 lb

## 2023-06-13 DIAGNOSIS — Z794 Long term (current) use of insulin: Secondary | ICD-10-CM | POA: Diagnosis not present

## 2023-06-13 DIAGNOSIS — I10 Essential (primary) hypertension: Secondary | ICD-10-CM

## 2023-06-13 DIAGNOSIS — E785 Hyperlipidemia, unspecified: Secondary | ICD-10-CM | POA: Diagnosis not present

## 2023-06-13 DIAGNOSIS — E1165 Type 2 diabetes mellitus with hyperglycemia: Secondary | ICD-10-CM

## 2023-06-13 LAB — POCT GLYCOSYLATED HEMOGLOBIN (HGB A1C): Hemoglobin A1C: 8.1 % — AB (ref 4.0–5.6)

## 2023-06-13 MED ORDER — SITAGLIPTIN PHOSPHATE 50 MG PO TABS: 50.0000 mg | ORAL_TABLET | Freq: Every day | ORAL | 1 refills | Status: DC

## 2023-06-13 MED ORDER — AMLODIPINE BESYLATE 10 MG PO TABS: 10.0000 mg | ORAL_TABLET | Freq: Every day | ORAL | 1 refills | Status: DC

## 2023-06-13 MED ORDER — METFORMIN HCL 1000 MG PO TABS: 1000.0000 mg | ORAL_TABLET | Freq: Two times a day (BID) | ORAL | 2 refills | Status: DC

## 2023-06-13 MED ORDER — SITAGLIPTIN PHOSPHATE 50 MG PO TABS: 50.0000 mg | ORAL_TABLET | Freq: Every day | ORAL | 0 refills | Status: DC

## 2023-06-13 MED ORDER — EMPAGLIFLOZIN 10 MG PO TABS: 10.0000 mg | ORAL_TABLET | Freq: Every day | ORAL | 1 refills | Status: DC

## 2023-06-13 NOTE — Assessment & Plan Note (Signed)
Lab Results  Component Value Date   CHOL 248 (H) 08/23/2022   HDL 65 08/23/2022   LDLCALC 163 (H) 08/23/2022   TRIG 114 08/23/2022   CHOLHDL 3.8 08/23/2022  LDL goal is less than 70 Currently on atorvastatin 20 mg daily Checking lipid panel

## 2023-06-13 NOTE — Patient Instructions (Addendum)
Goal for fasting blood sugar ranges from 80 to 120 and 2 hours after any meal or at bedtime should be between 130 to 170.    Marland Kitchen Uncontrolled type 2 diabetes mellitus with hyperglycemia, with long-term current use of insulin (HCC) (Primary)  - CMP14+EGFR; Future - Lipid panel; Future - POCT glycosylated hemoglobin (Hb A1C) - sitaGLIPtin (JANUVIA) 50 MG tablet; Take 1 tablet (50 mg total) by mouth daily.  Dispense: 90 tablet; Refill: 0    Essential hypertension  Around 3 times per week, check your blood pressure 2 times per day. once in the morning and once in the evening. The readings should be at least one minute apart. Write down these values and bring them to your next nurse visit/appointment.  When you check your BP, make sure you have been doing something calm/relaxing 5 minutes prior to checking. Both feet should be flat on the floor and you should be sitting. Use your left arm and make sure it is in a relaxed position (on a table), and that the cuff is at the approximate level/height of your heart.   Blood pressure goal is less than 130/80    It is important that you exercise regularly at least 30 minutes 5 times a week as tolerated  Think about what you will eat, plan ahead. Choose " clean, green, fresh or frozen" over canned, processed or packaged foods which are more sugary, salty and fatty. 70 to 75% of food eaten should be vegetables and fruit. Three meals at set times with snacks allowed between meals, but they must be fruit or vegetables. Aim to eat over a 12 hour period , example 7 am to 7 pm, and STOP after  your last meal of the day. Drink water,generally about 64 ounces per day, no other drink is as healthy. Fruit juice is best enjoyed in a healthy way, by EATING the fruit.  Thanks for choosing [facility] we consider it a privelige to serve you.

## 2023-06-13 NOTE — Progress Notes (Signed)
Established Patient Office Visit  Subjective:  Patient ID: Jessica Garrett, female    DOB: 02-05-1971  Age: 53 y.o. MRN: [id number]  CC:  Chief Complaint  Patient presents with   Diabetes    HPI Jessica Garrett is a 53 y.o. female  has a past medical history of Anemia, Diabetes mellitus without complication (HCC), GERD (gastroesophageal reflux disease), Headache, and Hypertension.  Patient presents for follow-up for her chronic medical conditions She denies any adverse reactions to current medications Please see assessment and plan section for full HPI     Past Medical History:  Diagnosis Date   Anemia    only with pregnancy   Diabetes mellitus without complication (HCC)    GERD (gastroesophageal reflux disease)    Headache    " I just go to bed when I have a headache"   Hypertension     Past Surgical History:  Procedure Laterality Date   CHOLECYSTECTOMY N/A 04/26/2016   Procedure: LAPAROSCOPIC CHOLECYSTECTOMY;  Surgeon: Axel Filler, MD;  Location: [facility] OR;  Service: General;  Laterality: N/A;   TUBAL LIGATION  1992   VAGINAL DELIVERY     x3   WISDOM TOOTH EXTRACTION     only on the R side , pt. reports that she was a wake for the procedure     Family History  Problem Relation Age of Onset   Heart disease Mother    Colon cancer Father    Hypertension Father    Diabetes Maternal Grandmother    Diabetes Brother    Hypertension Brother    Colon polyps Neg Hx    Esophageal cancer Neg Hx    Rectal cancer Neg Hx    Stomach cancer Neg Hx    Breast cancer Neg Hx     Social History   Socioeconomic History   Marital status: Single    Spouse name: Not on file   Number of children: Not on file   Years of education: Not on file   Highest education level: Not on file  Occupational History   Not on file  Tobacco Use   Smoking status: Never   Smokeless tobacco: Never  Vaping Use   Vaping status: Never Used  Substance and Sexual Activity   Alcohol use: Yes     Comment: occasional   Drug use: No   Sexual activity: Yes    Birth control/protection: None  Other Topics Concern   Not on file  Social History Narrative   Pt stated she is a social drinker   Social Drivers of Corporate investment banker Strain: Not on file  Food Insecurity: Not on file  Transportation Needs: Not on file  Physical Activity: Not on file  Stress: Not on file  Social Connections: Not on file  Intimate Partner Violence: Not on file    Outpatient Medications Prior to Visit  Medication Sig Dispense Refill   atorvastatin (LIPITOR) 20 MG tablet Take 1 tablet (20 mg total) by mouth daily. 90 tablet 1   fluticasone (FLONASE) 50 MCG/ACT nasal spray Place 2 sprays into both nostrils daily. 16 g 6   sertraline (ZOLOFT) 50 MG tablet Take 1 tablet (50 mg total) by mouth daily. 90 tablet 1   sodium chloride (OCEAN) 0.65 % SOLN nasal spray Place 1 spray into both nostrils as needed for congestion. 30 mL 0   amLODipine (NORVASC) 10 MG tablet Take 1 tablet (10 mg total) by mouth daily. 90 tablet 1   metFORMIN (GLUCOPHAGE) 1000  MG tablet Take 1 tablet (1,000 mg total) by mouth 2 (two) times daily with a meal. 180 tablet 2   sitaGLIPtin (JANUVIA) 25 MG tablet Take 1 tablet (25 mg total) by mouth daily. 180 tablet 2   PEG-KCl-NaCl-NaSulf-Na Asc-C (PLENVU) 140 g SOLR Take 1 kit by mouth as directed. (Patient not taking: Reported on 02/22/2022) 1 each 0   No facility-administered medications prior to visit.    No Known Allergies  ROS Review of Systems  Constitutional:  Negative for activity change, appetite change, chills, fatigue and fever.  HENT:  Negative for ear discharge, ear pain, hearing loss and sore throat.   Respiratory:  Negative for cough, chest tightness, shortness of breath and wheezing.   Cardiovascular:  Negative for chest pain, palpitations and leg swelling.  Gastrointestinal:  Negative for abdominal distention, abdominal pain, anal bleeding and blood in stool.   Genitourinary:  Negative for difficulty urinating, dysuria, flank pain, frequency, hematuria, menstrual problem, pelvic pain and vaginal bleeding.  Musculoskeletal:  Negative for arthralgias, back pain, gait problem and joint swelling.  Skin:  Negative for color change, pallor, rash and wound.  Neurological:  Negative for dizziness, tremors, facial asymmetry, weakness and headaches.  Hematological:  Negative for adenopathy. Does not bruise/bleed easily.  Psychiatric/Behavioral:  Negative for confusion, decreased concentration, hallucinations, self-injury and suicidal ideas.       Objective:    Physical Exam Vitals and nursing note reviewed.  Constitutional:      General: She is not in acute distress.    Appearance: Normal appearance. She is not ill-appearing, toxic-appearing or diaphoretic.  HENT:     Mouth/Throat:     Mouth: Mucous membranes are moist.     Pharynx: Oropharynx is clear. No oropharyngeal exudate or posterior oropharyngeal erythema.  Eyes:     General: No scleral icterus.       Right eye: No discharge.        Left eye: No discharge.     Extraocular Movements: Extraocular movements intact.     Conjunctiva/sclera: Conjunctivae normal.  Cardiovascular:     Rate and Rhythm: Normal rate and regular rhythm.     Pulses: Normal pulses.     Heart sounds: Normal heart sounds. No murmur heard.    No friction rub. No gallop.  Pulmonary:     Effort: Pulmonary effort is normal. No respiratory distress.     Breath sounds: Normal breath sounds. No stridor. No wheezing, rhonchi or rales.  Chest:     Chest wall: No tenderness.  Abdominal:     General: There is no distension.     Palpations: Abdomen is soft.     Tenderness: There is no abdominal tenderness. There is no right CVA tenderness, left CVA tenderness or guarding.  Musculoskeletal:        General: No swelling, tenderness, deformity or signs of injury.     Right lower leg: No edema.     Left lower leg: No edema.   Skin:    General: Skin is warm and dry.     Capillary Refill: Capillary refill takes less than 2 seconds.     Coloration: Skin is not jaundiced or pale.     Findings: No bruising, erythema or lesion.  Neurological:     Mental Status: She is alert and oriented to person, place, and time.     Motor: No weakness.     Coordination: Coordination normal.     Gait: Gait normal.  Psychiatric:  Mood and Affect: Mood normal.        Behavior: Behavior normal.        Thought Content: Thought content normal.        Judgment: Judgment normal.     BP 136/73   Pulse 87   Temp (!) 97 F (36.1 C)   Wt 151 lb (68.5 kg)   LMP  (LMP Unknown)   SpO2 98%   BMI 25.92 kg/m  Wt Readings from Last 3 Encounters:  06/13/23 151 lb (68.5 kg)  05/16/23 159 lb (72.1 kg)  02/21/23 150 lb 6.4 oz (68.2 kg)    Lab Results  Component Value Date   TSH 1.500 05/01/2017   Lab Results  Component Value Date   WBC 13.9 (H) 04/08/2022   HGB 14.1 04/08/2022   HCT 44.2 04/08/2022   MCV 96.1 04/08/2022   PLT 329 04/08/2022   Lab Results  Component Value Date   NA CANCELED 02/21/2023   K CANCELED 02/21/2023   CO2 CANCELED 02/21/2023   GLUCOSE CANCELED 02/21/2023   BUN CANCELED 02/21/2023   CREATININE CANCELED 02/21/2023   BILITOT CANCELED 02/21/2023   ALKPHOS CANCELED 02/21/2023   AST CANCELED 02/21/2023   ALT CANCELED 02/21/2023   PROT CANCELED 02/21/2023   ALBUMIN CANCELED 02/21/2023   CALCIUM CANCELED 02/21/2023   ANIONGAP 9 04/08/2022   EGFR 96 11/22/2022   Lab Results  Component Value Date   CHOL 248 (H) 08/23/2022   Lab Results  Component Value Date   HDL 65 08/23/2022   Lab Results  Component Value Date   LDLCALC 163 (H) 08/23/2022   Lab Results  Component Value Date   TRIG 114 08/23/2022   Lab Results  Component Value Date   CHOLHDL 3.8 08/23/2022   Lab Results  Component Value Date   HGBA1C 8.1 (A) 06/13/2023      Assessment & Plan:   Problem List Items  Addressed This Visit       Cardiovascular and Mediastinum   Essential hypertension   Systolic blood pressure is slightly elevated currently on amlodipine 10 mg daily, Continue current medication we will add an ACE or ARB at next visit if systolic BP remains elevated DASH diet and commitment to daily physical activity for a minimum of 30 minutes discussed and encouraged, as a part of hypertension management.     06/13/2023    4:07 PM 06/13/2023    3:19 PM 06/13/2023    3:14 PM 05/16/2023   10:53 AM 05/16/2023   10:40 AM 02/21/2023    9:40 AM 02/21/2023    9:26 AM  BP/Weight  Systolic BP 136 135 141 122 145 153 167  Diastolic BP 73 73 88 76 80 86 88  Wt. (Lbs)   151  159  150.4  BMI   25.92 kg/m2  27.29 kg/m2  25.82 kg/m2           Relevant Medications   amLODipine (NORVASC) 10 MG tablet     Endocrine   Uncontrolled type 2 diabetes mellitus with hyperglycemia, with long-term current use of insulin (HCC) - Primary   Lab Results  Component Value Date   HGBA1C 8.1 (A) 06/13/2023  Chronic uncontrolled condition Will increase Januvia to 50 mg daily and continue metformin 1000 mg twice daily Patient counseled on low-carb diet CBG goals discussed encouraged to monitor blood sugar at home Follow-up in 3 months  Jardiance 10 mg daily was initially ordered today  but I have discontinued the medication and updated  the patient with plans to increase Januvia to 50 mg daily      Relevant Medications   sitaGLIPtin (JANUVIA) 50 MG tablet   metFORMIN (GLUCOPHAGE) 1000 MG tablet   Other Relevant Orders   CMP14+EGFR   Lipid panel   POCT glycosylated hemoglobin (Hb A1C) (Completed)     Other   Dyslipidemia, goal LDL below 70   Lab Results  Component Value Date   CHOL 248 (H) 08/23/2022   HDL 65 08/23/2022   LDLCALC 163 (H) 08/23/2022   TRIG 114 08/23/2022   CHOLHDL 3.8 08/23/2022  LDL goal is less than 70 Currently on atorvastatin 20 mg daily Checking lipid panel      Relevant  Medications   amLODipine (NORVASC) 10 MG tablet   Other Relevant Orders   Lipid panel    Meds ordered this encounter  Medications   DISCONTD: empagliflozin (JARDIANCE) 10 MG TABS tablet    Sig: Take 1 tablet (10 mg total) by mouth daily before breakfast.    Dispense:  30 tablet    Refill:  1   DISCONTD: sitaGLIPtin (JANUVIA) 50 MG tablet    Sig: Take 1 tablet (50 mg total) by mouth daily.    Dispense:  90 tablet    Refill:  0   sitaGLIPtin (JANUVIA) 50 MG tablet    Sig: Take 1 tablet (50 mg total) by mouth daily.    Dispense:  90 tablet    Refill:  1   metFORMIN (GLUCOPHAGE) 1000 MG tablet    Sig: Take 1 tablet (1,000 mg total) by mouth 2 (two) times daily with a meal.    Dispense:  180 tablet    Refill:  2   amLODipine (NORVASC) 10 MG tablet    Sig: Take 1 tablet (10 mg total) by mouth daily.    Dispense:  90 tablet    Refill:  1    Follow-up: Return in about 4 weeks (around 07/11/2023) for DM.    Donell Beers, FNP

## 2023-06-13 NOTE — Assessment & Plan Note (Signed)
Systolic blood pressure is slightly elevated currently on amlodipine 10 mg daily, Continue current medication we will add an ACE or ARB at next visit if systolic BP remains elevated DASH diet and commitment to daily physical activity for a minimum of 30 minutes discussed and encouraged, as a part of hypertension management.     06/13/2023    4:07 PM 06/13/2023    3:19 PM 06/13/2023    3:14 PM 05/16/2023   10:53 AM 05/16/2023   10:40 AM 02/21/2023    9:40 AM 02/21/2023    9:26 AM  BP/Weight  Systolic BP 136 135 141 122 145 153 167  Diastolic BP 73 73 88 76 80 86 88  Wt. (Lbs)   151  159  150.4  BMI   25.92 kg/m2  27.29 kg/m2  25.82 kg/m2

## 2023-06-13 NOTE — Assessment & Plan Note (Addendum)
Lab Results  Component Value Date   HGBA1C 8.1 (A) 06/13/2023  Chronic uncontrolled condition Will increase Januvia to 50 mg daily and continue metformin 1000 mg twice daily Patient counseled on low-carb diet CBG goals discussed encouraged to monitor blood sugar at home Follow-up in 3 months  Jardiance 10 mg daily was initially ordered today  but I have discontinued the medication and updated the patient with plans to increase Januvia to 50 mg daily

## 2023-06-15 ENCOUNTER — Other Ambulatory Visit: Payer: 59

## 2023-06-15 DIAGNOSIS — Z794 Long term (current) use of insulin: Secondary | ICD-10-CM | POA: Diagnosis not present

## 2023-06-15 DIAGNOSIS — E1165 Type 2 diabetes mellitus with hyperglycemia: Secondary | ICD-10-CM

## 2023-06-15 DIAGNOSIS — E785 Hyperlipidemia, unspecified: Secondary | ICD-10-CM

## 2023-06-16 ENCOUNTER — Other Ambulatory Visit: Payer: Self-pay | Admitting: Nurse Practitioner

## 2023-06-16 ENCOUNTER — Other Ambulatory Visit: Payer: Self-pay

## 2023-06-16 DIAGNOSIS — E1169 Type 2 diabetes mellitus with other specified complication: Secondary | ICD-10-CM

## 2023-06-16 LAB — CMP14+EGFR
ALT: 16 [IU]/L (ref 0–32)
AST: 13 [IU]/L (ref 0–40)
Albumin: 4.3 g/dL (ref 3.8–4.9)
Alkaline Phosphatase: 87 [IU]/L (ref 44–121)
BUN/Creatinine Ratio: 21 (ref 9–23)
BUN: 16 mg/dL (ref 6–24)
Bilirubin Total: 0.4 mg/dL (ref 0.0–1.2)
CO2: 25 mmol/L (ref 20–29)
Calcium: 9.3 mg/dL (ref 8.7–10.2)
Chloride: 104 mmol/L (ref 96–106)
Creatinine, Ser: 0.77 mg/dL (ref 0.57–1.00)
Globulin, Total: 2.8 g/dL (ref 1.5–4.5)
Glucose: 207 mg/dL — ABNORMAL HIGH (ref 70–99)
Potassium: 5 mmol/L (ref 3.5–5.2)
Sodium: 139 mmol/L (ref 134–144)
Total Protein: 7.1 g/dL (ref 6.0–8.5)
eGFR: 92 mL/min/{1.73_m2} (ref >59)

## 2023-06-16 LAB — LIPID PANEL
Chol/HDL Ratio: 2.7 {ratio} (ref 0.0–4.4)
Cholesterol, Total: 126 mg/dL (ref 100–199)
HDL: 47 mg/dL (ref >39)
LDL Chol Calc (NIH): 65 mg/dL (ref 0–99)
Triglycerides: 66 mg/dL (ref 0–149)
VLDL Cholesterol Cal: 14 mg/dL (ref 5–40)

## 2023-06-16 MED ORDER — ATORVASTATIN CALCIUM 20 MG PO TABS: 20.0000 mg | ORAL_TABLET | Freq: Every day | ORAL | 1 refills | Status: DC

## 2023-07-10 ENCOUNTER — Other Ambulatory Visit: Payer: Self-pay | Admitting: Pharmacist

## 2023-07-10 DIAGNOSIS — E1165 Type 2 diabetes mellitus with hyperglycemia: Secondary | ICD-10-CM

## 2023-07-10 MED ORDER — BLOOD GLUCOSE MONITORING SUPPL DEVI: 1.0000 | Freq: Three times a day (TID) | 0 refills | Status: AC

## 2023-07-10 MED ORDER — LANCETS MISC. MISC: 1.0000 | Freq: Three times a day (TID) | 0 refills | Status: AC

## 2023-07-10 MED ORDER — BLOOD GLUCOSE TEST VI STRP: 1.0000 | ORAL_STRIP | Freq: Three times a day (TID) | 0 refills | Status: AC

## 2023-07-10 MED ORDER — LANCET DEVICE MISC: 1.0000 | Freq: Three times a day (TID) | 0 refills | Status: AC

## 2023-07-10 NOTE — Progress Notes (Unsigned)
 07/10/2023 Name: Jessica Garrett MRN: [id number] DOB: January 22, 1971  No chief complaint on file.   Jessica Garrett is a 53 y.o. year old female who presented for a telephone visit.  Patient was identified as falling into the [place] Measure - Diabetes.   Patient was: Referred to pharmacy for chronic disease management.   Subjective:  Care Team: Primary Care Provider: Donell Beers, FNP ; Next Scheduled Visit: 07/11/2023 - needs to reschedule for later  Medication Access/Adherence  Current Pharmacy:  [facility] [address] [place]), [place] - [address] [place] ([place]) [place] [address] Phone: [phone] Fax: [phone]  [facility] [address], [address] [place] [address] Phone: [phone] Fax: [phone]   Patient reports affordability concerns with their medications: No  Patient reports access/transportation concerns to their pharmacy: No  Patient reports adherence concerns with their medications:  No     Diabetes:  Current medications: Januvia 50 mg daily (not taking), metformin 1000 mg BID  Patient reports she has been out of Januvia since her last visit with PCP. Reports that copay was too expensive when she went to fill it, but states she will be able to afford this going forward without issues. Patient thought she needed to request a new prescription from [place] to have this filled again so she had been waiting. Has not been checking BG because her glucometer broke and she is requesting a new one.  Patient denies hypoglycemic s/sx including dizziness, shakiness, sweating. Patient denies hyperglycemic symptoms including polyuria, polydipsia, polyphagia, nocturia, neuropathy, blurred vision.  Current meal patterns: ~1-2 meals per day - Lunch: sandwich, fruit, leftovers from night before - Supper: chicken, beef, pork, trying to cut back on pasta- has swapped out for wheat  pasta, limiting fried foods ~1-2x per week, vegetables (cabbage, collard greens) - Snacks: berries, apples oranges, crackers and cheese, pickles, nuts, tries to avoid cookies - Drinks: water mainly, ginger ale, alcohol once or twice a week (1-2 drinks at a time)   Objective:  Lab Results  Component Value Date   HGBA1C 8.1 (A) 06/13/2023    Lab Results  Component Value Date   CREATININE 0.77 06/15/2023   BUN 16 06/15/2023   NA 139 06/15/2023   K 5.0 06/15/2023   CL 104 06/15/2023   CO2 25 06/15/2023    Lab Results  Component Value Date   CHOL 126 06/15/2023   HDL 47 06/15/2023   LDLCALC 65 06/15/2023   TRIG 66 06/15/2023   CHOLHDL 2.7 06/15/2023    Medications Reviewed Today     Reviewed by Vela Prose, RPH (Pharmacist) on 07/10/23 at 1139  Med List Status: <None>   Medication Order Taking? Sig Documenting Provider Last Dose Status Informant  amLODipine (NORVASC) 10 MG tablet [id number] Yes Take 1 tablet (10 mg total) by mouth daily. Donell Beers, FNP Taking Active   atorvastatin (LIPITOR) 20 MG tablet [id number] Yes Take 1 tablet (20 mg total) by mouth daily. Donell Beers, FNP Taking Active   fluticasone (FLONASE) 50 MCG/ACT nasal spray [id number] Yes Place 2 sprays into both nostrils daily. Donell Beers, FNP Taking Active            Med Note [facility], [place]   Tue Nov 22, 2022  8:55 AM) PRN   metFORMIN (GLUCOPHAGE) 1000 MG tablet [id number] Yes Take 1 tablet (1,000 mg total) by mouth 2 (two) times daily with a meal. [place],  Baird Kay, FNP Taking Active   PEG-KCl-NaCl-NaSulf-Na Asc-C (PLENVU) 140 g SOLR [id number]  Take 1 kit by mouth as directed.  Patient not taking: Reported on 02/22/2022   Napoleon Form, MD  Active   sertraline (ZOLOFT) 50 MG tablet [id number] Yes Take 1 tablet (50 mg total) by mouth daily. Massie Maroon, FNP Taking Active            Med Note Sherilyn Cooter, [facility]   Tue Feb 21, 2023  9:28 AM) Prn   sitaGLIPtin  (JANUVIA) 50 MG tablet [id number] No Take 1 tablet (50 mg total) by mouth daily.  Patient not taking: Reported on 07/10/2023   Donell Beers, FNP Not Taking Active   sodium chloride (OCEAN) 0.65 % SOLN nasal spray [id number]  Place 1 spray into both nostrils as needed for congestion. Donell Beers, FNP  Active            Med Note Junius Finner, [place]   Tue Nov 22, 2022  8:55 AM) PRN               Assessment/Plan:   Diabetes: - Currently uncontrolled based on last A1c 8.1% on 06/13/2023. Difficult to assess BG today given no patient reported BG readings. - Reviewed long term cardiovascular and renal outcomes of uncontrolled blood sugar - Reviewed goal A1c, goal fasting, and goal 2 hour post prandial glucose - Reviewed dietary modifications including limiting intake of sugary drinks like ginger ale and limiting intake of fried foods - Recommend to continue Januvia 50 mg daily. Counseled patient that prescription is on file at pharmacy and she needs to request fill from [facility]. Encouraged her to fill Januvia today. - Continue metformin 1000 mg twice daily  - Consider addition of ARB given dx of T2DM and UACR 78 mg/g - Recommend to check fasting blood glucose at least every other day. New RX for testing supplies sent today.    Follow Up Plan: PharmD on 08/07/2023, Patient to reschedule PCP visit that is currently scheduled for tomorrow, 2/25. Per  PCP note on 1/31 patient okay to cancel this appt and reschedule for 3 months.  Jarrett Ables, PharmD PGY-1 Pharmacy Resident

## 2023-07-11 ENCOUNTER — Ambulatory Visit: Payer: Self-pay | Admitting: Nurse Practitioner

## 2023-07-16 ENCOUNTER — Telehealth: Payer: Self-pay | Admitting: Nurse Practitioner

## 2023-07-16 NOTE — Telephone Encounter (Signed)
 Patient was identified as falling into the [place] Measure - Diabetes.   Patient was: Left voicemail to schedule with primary care provider.

## 2023-08-07 ENCOUNTER — Other Ambulatory Visit: Payer: Self-pay

## 2023-08-07 ENCOUNTER — Other Ambulatory Visit (HOSPITAL_COMMUNITY): Payer: Self-pay

## 2023-08-07 NOTE — Progress Notes (Signed)
 08/07/2023 Name: Jessica Garrett MRN: [id number] DOB: 1970/06/09  Chief Complaint  Patient presents with   Diabetes   Hypertension   Hyperlipidemia    Jessica Garrett is a 53 y.o. year old female who presented for a telephone visit.  Patient was identified as falling into the [place] Measure - Diabetes.   Patient was: Referred to pharmacy for chronic disease management.   Subjective: Patient reports she is doing okay. She reports that her dad was recently diagnosed with stage 4 cancer which has understandably caused increased stress.  Care Team: Primary Care Provider: Donell Beers, FNP ; Next Scheduled Visit: 08/09/23  Medication Access/Adherence  Current Pharmacy:  [facility] [address] [place]), [place] - [address] [place] ([place]) [place] [address] Phone: [phone] Fax: [phone]  [facility] - [facility] [address] [place] [address] Phone: [phone] Fax: [phone]   Patient reports affordability concerns with their medications: Yes - Januvia unaffordable Patient reports access/transportation concerns to their pharmacy: No  Patient reports adherence concerns with their medications:  Yes  - Patient reports she stopped taking her medications ~2 weeks ago.  Diabetes:  Current medications: metformin 1000 mg BID (stopped taking ~2 weeks ago), Januvia 50 mg daily (not taking due to cost, now says she will not be able to afford)  She reports she has been able to get testing supplies, but has not started checking her blood sugar.   Patient denies hypoglycemic s/sx including dizziness, shakiness, sweating. Patient denies hyperglycemic symptoms including polyuria, polydipsia, polyphagia, nocturia, neuropathy, blurred vision.   Current meal patterns:  ~1-2 meals per day - Lunch: sandwich, fruit, leftovers from night before - Supper: chicken, beef, pork, trying to cut  back on pasta- has swapped out for wheat pasta, limiting fried foods ~1-2x per week, vegetables (cabbage, collard greens) - Snacks: berries, apples oranges, crackers and cheese, pickles, nuts, tries to avoid cookies - Drinks: water mainly, ginger ale, alcohol once or twice a week (1-2 drinks at a time)   Hypertension:  Current medications: amlodipine 10 mg daily (stopped taking ~2 weeks ago)   Hyperlipidemia/ASCVD Risk Reduction  Current lipid lowering medications: atorvastatin 20 mg daily (stopped taking ~2 weeks ago)  Risk Factors: aortic atherosclerosis noted on CT in 2023, DM, HTN   Objective:  Lab Results  Component Value Date   HGBA1C 8.1 (A) 06/13/2023    Lab Results  Component Value Date   CREATININE 0.77 06/15/2023   BUN 16 06/15/2023   NA 139 06/15/2023   K 5.0 06/15/2023   CL 104 06/15/2023   CO2 25 06/15/2023    Lab Results  Component Value Date   CHOL 126 06/15/2023   HDL 47 06/15/2023   LDLCALC 65 06/15/2023   TRIG 66 06/15/2023   CHOLHDL 2.7 06/15/2023    Medications Reviewed Today     Reviewed by Vela Prose, RPH (Pharmacist) on 08/07/23 at 1343  Med List Status: <None>   Medication Order Taking? Sig Documenting Provider Last Dose Status Informant  amLODipine (NORVASC) 10 MG tablet [id number] No Take 1 tablet (10 mg total) by mouth daily.  Patient not taking: Reported on 08/07/2023   Donell Beers, FNP Not Taking Active   atorvastatin (LIPITOR) 20 MG tablet [id number] No Take 1 tablet (20 mg total) by mouth daily.  Patient not taking: Reported on 08/07/2023   Donell Beers, FNP Not Taking Active   Blood Glucose Monitoring  Suppl DEVI [id number]  1 each by Does not apply route in the morning, at noon, and at bedtime. May substitute to any manufacturer covered by patient's insurance. Donell Beers, FNP  Active   fluticasone (FLONASE) 50 MCG/ACT nasal spray [id number]  Place 2 sprays into both nostrils daily. Donell Beers, FNP   Active            Med Note Junius Finner, GEORGINA   Tue Nov 22, 2022  8:55 AM) PRN   Glucose Blood (BLOOD GLUCOSE TEST STRIPS) STRP [id number]  1 each by In Vitro route in the morning, at noon, and at bedtime. May substitute to any manufacturer covered by patient's insurance. Donell Beers, FNP  Active   Lancet Device MISC [id number]  1 each by Does not apply route in the morning, at noon, and at bedtime. May substitute to any manufacturer covered by patient's insurance. Donell Beers, FNP  Active   Lancets Misc. MISC [id number]  1 each by Does not apply route in the morning, at noon, and at bedtime. May substitute to any manufacturer covered by patient's insurance. Donell Beers, FNP  Active   metFORMIN (GLUCOPHAGE) 1000 MG tablet [id number] No Take 1 tablet (1,000 mg total) by mouth 2 (two) times daily with a meal.  Patient not taking: Reported on 08/07/2023   Donell Beers, FNP Not Taking Active   PEG-KCl-NaCl-NaSulf-Na Asc-C (PLENVU) 140 g SOLR [id number]  Take 1 kit by mouth as directed.  Patient not taking: Reported on 02/22/2022   Napoleon Form, MD  Active   sertraline (ZOLOFT) 50 MG tablet [id number] No Take 1 tablet (50 mg total) by mouth daily.  Patient not taking: Reported on 08/07/2023   Massie Maroon, FNP Not Taking Active            Med Note Sherilyn Cooter, [facility]   Tue Feb 21, 2023  9:28 AM) Prn   sitaGLIPtin (JANUVIA) 50 MG tablet [id number] No Take 1 tablet (50 mg total) by mouth daily.  Patient not taking: Reported on 08/07/2023   Donell Beers, FNP Not Taking Active   sodium chloride (OCEAN) 0.65 % SOLN nasal spray [id number]  Place 1 spray into both nostrils as needed for congestion. Donell Beers, FNP  Active            Med Note Junius Finner, GEORGINA   Tue Nov 22, 2022  8:55 AM) PRN               Assessment/Plan:   Diabetes: - Currently uncontrolled with last A1c 8.1% on 06/13/23, above goal <7% likely due to  non-adherence to medications. Suspect more long term non-adherence given metformin last fill date 08/23/22. Today, she admits she does sometimes forget to take medication, but lately stress has been contributing to her not taking medications with her dad's recent stage 4 cancer diagnosis. Stopped taking all her medications ~2 weeks ago. Encouraged her to restart metformin. Will discontinue Januvia as she has not been taking and now says she will not be able to afford. Also suspect improved BG control with improved adherence to metformin. Let her know that we are here to support her during this time and encouraged her to reach out if we can assist in any way.  - Reviewed long term cardiovascular and renal outcomes of uncontrolled blood sugar - Reviewed goal A1c, goal fasting, and goal 2 hour post prandial glucose - Recommend to take metformin 1000 mg BID as prescribed  - Recommend to  discontinue Januvia - Recommend to check fasting blood glucose daily - A1c due 09/11/23    Hypertension: - Currently uncontrolled, but close to goal <130/80 based on last clinic BP 136/73. Encouraged her to take amlodipine as prescribed. Patient with UACR 78 mg/g indicating need for RAAS therapy. Would recommend adding ARB for renal benefit and additional hypertension control in the future. Will focus on adherence to current medications today given current life stressors. - Reviewed long term cardiovascular and renal outcomes of uncontrolled blood pressure - Recommend to continue amlodipine 10 mg daily as prescribed     Hyperlipidemia/ASCVD Risk Reduction: - Currently controlled based on last lipid panel with LDL 65 mg/dL, below goal <78 mg/dL given risk factors aortic atherosclerosis, DM, HTN. - Reviewed long term complications of uncontrolled cholesterol - Recommend to continue atorvastatin 20 mg daily as prescribed.   Follow Up Plan: PharmD 08/22/23, patient has a visit scheduled with PCP on 08/09/23, but informed me  she needs to reschedule. Will reach out to coordinate rescheduling.   Jarrett Ables, PharmD PGY-1 Pharmacy Resident

## 2023-08-09 ENCOUNTER — Ambulatory Visit: Payer: Self-pay | Admitting: Nurse Practitioner

## 2023-08-22 ENCOUNTER — Other Ambulatory Visit: Payer: Self-pay

## 2023-08-22 ENCOUNTER — Other Ambulatory Visit (HOSPITAL_COMMUNITY): Payer: Self-pay

## 2023-08-22 NOTE — Progress Notes (Unsigned)
 08/22/2023 Name: Jessica Garrett MRN: [id number] DOB: 23-Jul-1970  No chief complaint on file.   Jessica Garrett is a 53 y.o. year old female who presented for a telephone visit.  Patient was identified as falling into the [place] Measure - Diabetes.   Patient was: Referred to pharmacy for chronic disease management.   Subjective: Patient reports she is doing okay. She reports that her dad was recently diagnosed with stage 4 cancer which has understandably caused increased stress.  Insurance?  Adherence to metformin?  Possibly add ARB with elevated UACR? Did reschedule fola to 09/15/23 - will be due for repeat A1C  Care Team: Primary Care Provider: Donell Beers, FNP ; Next Scheduled Visit: 09/15/23  Medication Access/Adherence  Current Pharmacy:  [facility] [address] [place]), [place] - [address] [place] ([place]) [place] [address] Phone: [phone] Fax: [phone]  [facility] - [facility] [address] [place] [address] Phone: [phone] Fax: [phone]   Patient reports affordability concerns with their medications: Yes - Januvia unaffordable Patient reports access/transportation concerns to their pharmacy: No  Patient reports adherence concerns with their medications:  Yes  - Patient reports she stopped taking her medications ~2 weeks ago.  Diabetes:  Current medications: metformin 1000 mg BID (stopped taking ~2 weeks ago), Januvia 50 mg daily (not taking due to cost, now says she will not be able to afford)  She reports she has been able to get testing supplies, but has not started checking her blood sugar.   Patient denies hypoglycemic s/sx including dizziness, shakiness, sweating. Patient denies hyperglycemic symptoms including polyuria, polydipsia, polyphagia, nocturia, neuropathy, blurred vision.   Current meal patterns:  ~1-2 meals per day - Lunch: sandwich,  fruit, leftovers from night before - Supper: chicken, beef, pork, trying to cut back on pasta- has swapped out for wheat pasta, limiting fried foods ~1-2x per week, vegetables (cabbage, collard greens) - Snacks: berries, apples oranges, crackers and cheese, pickles, nuts, tries to avoid cookies - Drinks: water mainly, ginger ale, alcohol once or twice a week (1-2 drinks at a time)   Hypertension:  Current medications: amlodipine 10 mg daily (stopped taking ~2 weeks ago)   Hyperlipidemia/ASCVD Risk Reduction  Current lipid lowering medications: atorvastatin 20 mg daily (stopped taking ~2 weeks ago)  Risk Factors: aortic atherosclerosis noted on CT in 2023, DM, HTN   Objective:  Lab Results  Component Value Date   HGBA1C 8.1 (A) 06/13/2023    Lab Results  Component Value Date   CREATININE 0.77 06/15/2023   BUN 16 06/15/2023   NA 139 06/15/2023   K 5.0 06/15/2023   CL 104 06/15/2023   CO2 25 06/15/2023    Lab Results  Component Value Date   CHOL 126 06/15/2023   HDL 47 06/15/2023   LDLCALC 65 06/15/2023   TRIG 66 06/15/2023   CHOLHDL 2.7 06/15/2023    Medications Reviewed Today   Medications were not reviewed in this encounter       Assessment/Plan:   Diabetes: - Currently uncontrolled with last A1c 8.1% on 06/13/23, above goal <7% likely due to non-adherence to medications. Suspect more long term non-adherence given metformin last fill date 08/23/22. Today, she admits she does sometimes forget to take medication, but lately stress has been contributing to her not taking medications with her dad's recent stage 4 cancer diagnosis. Stopped taking all her medications ~2 weeks ago. Encouraged her to restart metformin.  Will discontinue Januvia as she has not been taking and now says she will not be able to afford. Also suspect improved BG control with improved adherence to metformin. Let her know that we are here to support her during this time and encouraged her to reach  out if we can assist in any way.  - Reviewed long term cardiovascular and renal outcomes of uncontrolled blood sugar - Reviewed goal A1c, goal fasting, and goal 2 hour post prandial glucose - Recommend to take metformin 1000 mg BID as prescribed  - Recommend to discontinue Januvia - Recommend to check fasting blood glucose daily - A1c due 09/11/23    Hypertension: - Currently uncontrolled, but close to goal <130/80 based on last clinic BP 136/73. Encouraged her to take amlodipine as prescribed. Patient with UACR 78 mg/g indicating need for RAAS therapy. Would recommend adding ARB for renal benefit and additional hypertension control in the future. Will focus on adherence to current medications today given current life stressors. - Reviewed long term cardiovascular and renal outcomes of uncontrolled blood pressure - Recommend to continue amlodipine 10 mg daily as prescribed     Hyperlipidemia/ASCVD Risk Reduction: - Currently controlled based on last lipid panel with LDL 65 mg/dL, below goal <57 mg/dL given risk factors aortic atherosclerosis, DM, HTN. - Reviewed long term complications of uncontrolled cholesterol - Recommend to continue atorvastatin 20 mg daily as prescribed.   Follow Up Plan: PharmD 08/22/23, patient has a visit scheduled with PCP on 08/09/23, but informed me she needs to reschedule. Will reach out to coordinate rescheduling.   Jarrett Ables, PharmD PGY-1 Pharmacy Resident

## 2023-08-22 NOTE — Progress Notes (Signed)
 Attempted to contact patient for scheduled appointment for medication management. Spoke with patient earlier today, when she requested to reschedule appointment and stated she would be available this evening at 6:30PM. There was no option to leave a voicemail with a call back number. Will attempt to outreach patient via MyChart.   PCP follow-up scheduled for 09/15/23.   Nils Pyle, PharmD PGY1 Pharmacy Resident

## 2023-09-12 ENCOUNTER — Telehealth: Payer: Self-pay | Admitting: *Deleted

## 2023-09-12 NOTE — Progress Notes (Signed)
 Complex Care Management Care Guide Note  09/12/2023 Name: Jessica Garrett MRN: [id number] DOB: [birth year]  Jessica Garrett is a 53 y.o. year old female who is a primary care patient of Paseda, Folashade R, FNP and is actively engaged with the care management team. I reached out to Jessica Garrett by phone today to assist with re-scheduling  with the Pharmacist.  Follow up plan: Unsuccessful telephone outreach attempt made.   Jessica Garrett  [facility]  [facility] Guide  Direct Dial: [phone]  Fax [phone]

## 2023-09-15 ENCOUNTER — Encounter: Payer: Self-pay | Admitting: Nurse Practitioner

## 2023-09-15 ENCOUNTER — Ambulatory Visit (INDEPENDENT_AMBULATORY_CARE_PROVIDER_SITE_OTHER): Payer: Self-pay | Admitting: Nurse Practitioner

## 2023-09-15 VITALS — BP 131/67 | HR 65 | Temp 97.1°F | Ht 64.5 in | Wt 149.0 lb

## 2023-09-15 DIAGNOSIS — I10 Essential (primary) hypertension: Secondary | ICD-10-CM | POA: Diagnosis not present

## 2023-09-15 DIAGNOSIS — Z794 Long term (current) use of insulin: Secondary | ICD-10-CM

## 2023-09-15 DIAGNOSIS — R809 Proteinuria, unspecified: Secondary | ICD-10-CM | POA: Insufficient documentation

## 2023-09-15 DIAGNOSIS — E785 Hyperlipidemia, unspecified: Secondary | ICD-10-CM

## 2023-09-15 DIAGNOSIS — E1165 Type 2 diabetes mellitus with hyperglycemia: Secondary | ICD-10-CM

## 2023-09-15 LAB — POCT GLYCOSYLATED HEMOGLOBIN (HGB A1C): Hemoglobin A1C: 8.4 % — AB (ref 4.0–5.6)

## 2023-09-15 MED ORDER — GLIPIZIDE 5 MG PO TABS: 5.0000 mg | ORAL_TABLET | Freq: Two times a day (BID) | ORAL | 3 refills | Status: DC

## 2023-09-15 MED ORDER — AMLODIPINE BESYLATE 5 MG PO TABS: 5.0000 mg | ORAL_TABLET | Freq: Every day | ORAL | 1 refills | Status: DC

## 2023-09-15 MED ORDER — GLIPIZIDE 5 MG PO TABS: 5.0000 mg | ORAL_TABLET | Freq: Two times a day (BID) | ORAL | 1 refills | Status: DC

## 2023-09-15 MED ORDER — VALSARTAN 80 MG PO TABS: 80.0000 mg | ORAL_TABLET | Freq: Every day | ORAL | 1 refills | Status: DC

## 2023-09-15 NOTE — Assessment & Plan Note (Addendum)
 Starting valsartan 80 mg daily Need to get diabetes under control discussed

## 2023-09-15 NOTE — Progress Notes (Addendum)
 Complex Care Management Care Guide Note  09/15/2023 Name: Dajia Roberti MRN: [id number] DOB: 06-Jul-1970  Anah Wilkens is a 53 y.o. year old female who is a primary care patient of Paseda, Folashade R, FNP and is actively engaged with the care management team. I reached out to Lanya Riggan by phone today to assist with re-scheduling  with the Pharmacist.  Follow up plan: Telephone appointment with complex care management team member scheduled for:  10/22/23  At 11:30 AM   [facility]  [facility] Guide  Direct Dial: [phone]  Fax [phone]

## 2023-09-15 NOTE — Assessment & Plan Note (Addendum)
 Decrease amlodipine  to 5 mg daily and start valsartan 80 mg daily for kidney protection Blood pressure goal is less than 130/80 BMP today, repeat labs in 2 weeks and follow-up in 4 weeks DASH diet and commitment to daily physical activity for a minimum of 30 minutes discussed and encouraged, as a part of hypertension management.  Appreciate collaboration with the clinical pharmacist      09/15/2023    7:58 AM 06/13/2023    4:07 PM 06/13/2023    3:19 PM 06/13/2023    3:14 PM 05/16/2023   10:53 AM 05/16/2023   10:40 AM 02/21/2023    9:40 AM  BP/Weight  Systolic BP 131 136 135 141 122 145 153  Diastolic BP 67 73 73 88 76 80 86  Wt. (Lbs) 149   151  159   BMI 25.18 kg/m2   25.92 kg/m2  27.29 kg/m2

## 2023-09-15 NOTE — Assessment & Plan Note (Signed)
 Patient encouraged to take atorvastatin  20 mg daily as ordered Lab Results  Component Value Date   CHOL 126 06/15/2023   HDL 47 06/15/2023   LDLCALC 65 06/15/2023   TRIG 66 06/15/2023   CHOLHDL 2.7 06/15/2023

## 2023-09-15 NOTE — Progress Notes (Signed)
 Established Patient Office Visit  Subjective:  Patient ID: Jessica Garrett, female    DOB: 1970-06-30  Age: 53 y.o. MRN: [id number]  CC:  Chief Complaint  Patient presents with   Diabetes    HPI Jessica Garrett is a 53 y.o. female  has a past medical history of Anemia, Diabetes mellitus without complication (HCC), GERD (gastroesophageal reflux disease), Headache, and Hypertension.  Patient presents for follow-up for her chronic medical conditions  Hypertension.  Currently on amlodipine  10 mg daily, she denies chest pain, shortness of breath, edema  Uncontrolled type 2 diabetes.  Currently on metformin  1000 mg twice daily, Januvia  was ordered but she is not taking the medication due to cost.  States that she follows a low-carb diet, takes atorvastatin  20 mg tablets whenever she remembers to take the medication.  She denies polyuria polyphagia polydipsia.   She declined referral to ophthalmologist for diabetic eye exam, she has received the Cologuard test kit but has not completed the test.     Past Medical History:  Diagnosis Date   Anemia    only with pregnancy   Diabetes mellitus without complication (HCC)    GERD (gastroesophageal reflux disease)    Headache    " I just go to bed when I have a headache"   Hypertension     Past Surgical History:  Procedure Laterality Date   CHOLECYSTECTOMY N/A 04/26/2016   Procedure: LAPAROSCOPIC CHOLECYSTECTOMY;  Surgeon: Shela Derby, MD;  Location: [facility] OR;  Service: General;  Laterality: N/A;   TUBAL LIGATION  1992   VAGINAL DELIVERY     x3   WISDOM TOOTH EXTRACTION     only on the R side , pt. reports that she was a wake for the procedure     Family History  Problem Relation Age of Onset   Heart disease Mother    Colon cancer Father    Hypertension Father    Diabetes Maternal Grandmother    Diabetes Brother    Hypertension Brother    Colon polyps Neg Hx    Esophageal cancer Neg Hx    Rectal cancer Neg Hx    Stomach cancer  Neg Hx    Breast cancer Neg Hx     Social History   Socioeconomic History   Marital status: Single    Spouse name: Not on file   Number of children: Not on file   Years of education: Not on file   Highest education level: Not on file  Occupational History   Not on file  Tobacco Use   Smoking status: Never   Smokeless tobacco: Never  Vaping Use   Vaping status: Never Used  Substance and Sexual Activity   Alcohol use: Yes    Comment: occasional   Drug use: No   Sexual activity: Yes    Birth control/protection: None  Other Topics Concern   Not on file  Social History Narrative   Pt stated she is a social drinker   Social Drivers of Corporate investment banker Strain: Not on file  Food Insecurity: Not on file  Transportation Needs: Not on file  Physical Activity: Not on file  Stress: Not on file  Social Connections: Not on file  Intimate Partner Violence: Not on file    Outpatient Medications Prior to Visit  Medication Sig Dispense Refill   atorvastatin  (LIPITOR) 20 MG tablet Take 1 tablet (20 mg total) by mouth daily. 90 tablet 1   Blood Glucose Monitoring Suppl DEVI 1  each by Does not apply route in the morning, at noon, and at bedtime. May substitute to any manufacturer covered by patient's insurance. 1 each 0   fluticasone  (FLONASE ) 50 MCG/ACT nasal spray Place 2 sprays into both nostrils daily. 16 g 6   metFORMIN  (GLUCOPHAGE ) 1000 MG tablet Take 1 tablet (1,000 mg total) by mouth 2 (two) times daily with a meal. 180 tablet 2   sodium chloride  (OCEAN) 0.65 % SOLN nasal spray Place 1 spray into both nostrils as needed for congestion. 30 mL 0   amLODipine  (NORVASC ) 10 MG tablet Take 1 tablet (10 mg total) by mouth daily. 90 tablet 1   PEG-KCl-NaCl-NaSulf-Na Asc-C (PLENVU ) 140 g SOLR Take 1 kit by mouth as directed. (Patient not taking: Reported on 09/15/2023) 1 each 0   sertraline  (ZOLOFT ) 50 MG tablet Take 1 tablet (50 mg total) by mouth daily. (Patient not taking:  Reported on 09/15/2023) 90 tablet 1   No facility-administered medications prior to visit.    No Known Allergies  ROS Review of Systems  Constitutional:  Negative for appetite change, chills, fatigue and fever.  HENT:  Negative for congestion, postnasal drip, rhinorrhea and sneezing.   Respiratory:  Negative for cough, shortness of breath and wheezing.   Cardiovascular:  Negative for chest pain, palpitations and leg swelling.  Gastrointestinal:  Negative for abdominal pain, constipation, nausea and vomiting.  Genitourinary:  Negative for difficulty urinating, dysuria, flank pain and frequency.  Musculoskeletal:  Negative for arthralgias, back pain, joint swelling and myalgias.  Skin:  Negative for color change, pallor, rash and wound.  Neurological:  Negative for dizziness, facial asymmetry, weakness, numbness and headaches.  Psychiatric/Behavioral:  Negative for behavioral problems, confusion, self-injury and suicidal ideas.       Objective:    Physical Exam Vitals and nursing note reviewed.  Constitutional:      General: She is not in acute distress.    Appearance: Normal appearance. She is not ill-appearing, toxic-appearing or diaphoretic.  Eyes:     General: No scleral icterus.       Right eye: No discharge.        Left eye: No discharge.     Extraocular Movements: Extraocular movements intact.     Conjunctiva/sclera: Conjunctivae normal.  Cardiovascular:     Rate and Rhythm: Normal rate and regular rhythm.     Pulses: Normal pulses.     Heart sounds: Normal heart sounds. No murmur heard.    No friction rub. No gallop.  Pulmonary:     Effort: Pulmonary effort is normal. No respiratory distress.     Breath sounds: Normal breath sounds. No stridor. No wheezing, rhonchi or rales.  Chest:     Chest wall: No tenderness.  Abdominal:     General: There is no distension.     Palpations: Abdomen is soft.     Tenderness: There is no abdominal tenderness. There is no right CVA  tenderness, left CVA tenderness or guarding.  Musculoskeletal:        General: No swelling, tenderness, deformity or signs of injury.     Right lower leg: No edema.     Left lower leg: No edema.  Skin:    General: Skin is warm and dry.     Capillary Refill: Capillary refill takes less than 2 seconds.     Coloration: Skin is not jaundiced or pale.     Findings: No bruising, erythema or lesion.  Neurological:     Mental Status: She is  alert and oriented to person, place, and time.     Motor: No weakness.     Coordination: Coordination normal.     Gait: Gait normal.  Psychiatric:        Mood and Affect: Mood normal.        Behavior: Behavior normal.        Thought Content: Thought content normal.        Judgment: Judgment normal.     BP 131/67   Pulse 65   Temp (!) 97.1 F (36.2 C)   Ht 5' 4.5" (1.638 m)   Wt 149 lb (67.6 kg)   LMP  (LMP Unknown)   SpO2 100%   BMI 25.18 kg/m  Wt Readings from Last 3 Encounters:  09/15/23 149 lb (67.6 kg)  06/13/23 151 lb (68.5 kg)  05/16/23 159 lb (72.1 kg)    Lab Results  Component Value Date   TSH 1.500 05/01/2017   Lab Results  Component Value Date   WBC 13.9 (H) 04/08/2022   HGB 14.1 04/08/2022   HCT 44.2 04/08/2022   MCV 96.1 04/08/2022   PLT 329 04/08/2022   Lab Results  Component Value Date   NA 139 06/15/2023   K 5.0 06/15/2023   CO2 25 06/15/2023   GLUCOSE 207 (H) 06/15/2023   BUN 16 06/15/2023   CREATININE 0.77 06/15/2023   BILITOT 0.4 06/15/2023   ALKPHOS 87 06/15/2023   AST 13 06/15/2023   ALT 16 06/15/2023   PROT 7.1 06/15/2023   ALBUMIN 4.3 06/15/2023   CALCIUM  9.3 06/15/2023   ANIONGAP 9 04/08/2022   EGFR 92 06/15/2023   Lab Results  Component Value Date   CHOL 126 06/15/2023   Lab Results  Component Value Date   HDL 47 06/15/2023   Lab Results  Component Value Date   LDLCALC 65 06/15/2023   Lab Results  Component Value Date   TRIG 66 06/15/2023   Lab Results  Component Value Date    CHOLHDL 2.7 06/15/2023   Lab Results  Component Value Date   HGBA1C 8.4 (A) 09/15/2023      Assessment & Plan:   Problem List Items Addressed This Visit       Cardiovascular and Mediastinum   Essential hypertension   Decrease amlodipine  to 5 mg daily and start valsartan 80 mg daily for kidney protection Blood pressure goal is less than 130/80 BMP today, repeat labs in 2 weeks and follow-up in 4 weeks DASH diet and commitment to daily physical activity for a minimum of 30 minutes discussed and encouraged, as a part of hypertension management.  Appreciate collaboration with the clinical pharmacist      09/15/2023    7:58 AM 06/13/2023    4:07 PM 06/13/2023    3:19 PM 06/13/2023    3:14 PM 05/16/2023   10:53 AM 05/16/2023   10:40 AM 02/21/2023    9:40 AM  BP/Weight  Systolic BP 131 136 135 141 122 145 153  Diastolic BP 67 73 73 88 76 80 86  Wt. (Lbs) 149   151  159   BMI 25.18 kg/m2   25.92 kg/m2  27.29 kg/m2            Relevant Medications   valsartan (DIOVAN) 80 MG tablet   amLODipine  (NORVASC ) 5 MG tablet   Other Relevant Orders   Basic Metabolic Panel   Basic Metabolic Panel     Endocrine   Uncontrolled type 2 diabetes mellitus with hyperglycemia, with long-term  current use of insulin (HCC) - Primary   Lab Results  Component Value Date   HGBA1C 8.4 (A) 09/15/2023  Chronic uncontrolled condition Continue metformin  1000 mg twice daily Start glipizide 5 mg twice daily CBG goals discussed Starting valsartan for kidney protection Encouraged to take atorvastatin  20 mg daily as ordered Appreciate collaboration with the clinical pharmacist      Relevant Medications   valsartan (DIOVAN) 80 MG tablet   glipiZIDE (GLUCOTROL) 5 MG tablet   Other Relevant Orders   POCT glycosylated hemoglobin (Hb A1C) (Completed)     Other   Dyslipidemia, goal LDL below 70   Patient encouraged to take atorvastatin  20 mg daily as ordered Lab Results  Component Value Date   CHOL  126 06/15/2023   HDL 47 06/15/2023   LDLCALC 65 06/15/2023   TRIG 66 06/15/2023   CHOLHDL 2.7 06/15/2023         Relevant Medications   valsartan (DIOVAN) 80 MG tablet   amLODipine  (NORVASC ) 5 MG tablet   Microalbuminuria   Starting valsartan 80 mg daily Need to get diabetes under control discussed       Meds ordered this encounter  Medications   valsartan (DIOVAN) 80 MG tablet    Sig: Take 1 tablet (80 mg total) by mouth daily.    Dispense:  90 tablet    Refill:  1   amLODipine  (NORVASC ) 5 MG tablet    Sig: Take 1 tablet (5 mg total) by mouth daily.    Dispense:  90 tablet    Refill:  1   DISCONTD: glipiZIDE (GLUCOTROL) 5 MG tablet    Sig: Take 1 tablet (5 mg total) by mouth 2 (two) times daily before a meal.    Dispense:  60 tablet    Refill:  3   glipiZIDE (GLUCOTROL) 5 MG tablet    Sig: Take 1 tablet (5 mg total) by mouth 2 (two) times daily before a meal.    Dispense:  180 tablet    Refill:  1    Follow-up: Return in about 4 weeks (around 10/13/2023) for HTN, CPE, PAP.    Maci Eickholt R Khalessi Blough, FNP

## 2023-09-15 NOTE — Assessment & Plan Note (Signed)
 Lab Results  Component Value Date   HGBA1C 8.4 (A) 09/15/2023  Chronic uncontrolled condition Continue metformin  1000 mg twice daily Start glipizide 5 mg twice daily CBG goals discussed Starting valsartan for kidney protection Encouraged to take atorvastatin  20 mg daily as ordered Appreciate collaboration with the clinical pharmacist

## 2023-09-15 NOTE — Patient Instructions (Signed)
 Goal for fasting blood sugar ranges from 80 to 120 and 2 hours after any meal or at bedtime should be between 130 to 170.   Around 3 times per week, check your blood pressure 2 times per day. once in the morning and once in the evening. The readings should be at least one minute apart. Write down these values and bring them to your next nurse visit/appointment.  When you check your BP, make sure you have been doing something calm/relaxing 5 minutes prior to checking. Both feet should be flat on the floor and you should be sitting. Use your left arm and make sure it is in a relaxed position (on a table), and that the cuff is at the approximate level/height of your heart. Blood pressure goal is less than 130/80   1. Uncontrolled type 2 diabetes mellitus with hyperglycemia, with long-term current use of insulin (HCC) (Primary)  - POCT glycosylated hemoglobin (Hb A1C) - glipiZIDE (GLUCOTROL) 5 MG tablet; Take 1 tablet (5 mg total) by mouth 2 (two) times daily before a meal.  Dispense: 60 tablet; Refill: 3  2. Essential hypertension  - valsartan (DIOVAN) 80 MG tablet; Take 1 tablet (80 mg total) by mouth daily.  Dispense: 90 tablet; Refill: 1 - amLODipine  (NORVASC ) 5 MG tablet; Take 1 tablet (5 mg total) by mouth daily.  Dispense: 90 tablet; Refill: 1 - Basic Metabolic Panel - Basic Metabolic Panel; Future  It is important that you exercise regularly at least 30 minutes 5 times a week as tolerated  Think about what you will eat, plan ahead. Choose " clean, green, fresh or frozen" over canned, processed or packaged foods which are more sugary, salty and fatty. 70 to 75% of food eaten should be vegetables and fruit. Three meals at set times with snacks allowed between meals, but they must be fruit or vegetables. Aim to eat over a 12 hour period , example 7 am to 7 pm, and STOP after  your last meal of the day. Drink water,generally about 64 ounces per day, no other drink is as healthy. Fruit juice  is best enjoyed in a healthy way, by EATING the fruit.  Thanks for choosing [facility] we consider it a privelige to serve you.

## 2023-09-16 LAB — BASIC METABOLIC PANEL WITH GFR
BUN/Creatinine Ratio: 17 (ref 9–23)
BUN: 12 mg/dL (ref 6–24)
CO2: 20 mmol/L (ref 20–29)
Calcium: 10.3 mg/dL — ABNORMAL HIGH (ref 8.7–10.2)
Chloride: 102 mmol/L (ref 96–106)
Creatinine, Ser: 0.72 mg/dL (ref 0.57–1.00)
Glucose: 160 mg/dL — ABNORMAL HIGH (ref 70–99)
Potassium: 4.5 mmol/L (ref 3.5–5.2)
Sodium: 138 mmol/L (ref 134–144)
eGFR: 99 mL/min/{1.73_m2} (ref >59)

## 2023-10-02 ENCOUNTER — Telehealth: Payer: Self-pay | Admitting: Nurse Practitioner

## 2023-10-02 NOTE — Telephone Encounter (Signed)
 Patient was identified as falling into the [place] Measure - Diabetes.   Patient was: Appointment scheduled with primary care provider in the next 30 days.  10/16/23

## 2023-10-16 ENCOUNTER — Encounter: Payer: Self-pay | Admitting: Nurse Practitioner

## 2023-10-16 ENCOUNTER — Ambulatory Visit (INDEPENDENT_AMBULATORY_CARE_PROVIDER_SITE_OTHER): Payer: Self-pay | Admitting: Nurse Practitioner

## 2023-10-16 VITALS — BP 141/71 | HR 71 | Temp 98.4°F | Wt 150.6 lb

## 2023-10-16 DIAGNOSIS — E1165 Type 2 diabetes mellitus with hyperglycemia: Secondary | ICD-10-CM

## 2023-10-16 DIAGNOSIS — Z794 Long term (current) use of insulin: Secondary | ICD-10-CM

## 2023-10-16 DIAGNOSIS — Z1211 Encounter for screening for malignant neoplasm of colon: Secondary | ICD-10-CM

## 2023-10-16 DIAGNOSIS — I10 Essential (primary) hypertension: Secondary | ICD-10-CM

## 2023-10-16 NOTE — Progress Notes (Signed)
 Subjective   Patient ID: Jessica Garrett, female    DOB: Mar 01, 1971, 53 y.o.   MRN: [id number]  Chief Complaint  Patient presents with   Medical Management of Chronic Issues    Last A1C was 1 month ago at 8.4    Referring provider: Paseda, Folashade R, FNP  Jessica Garrett is a 53 y.o. female with Past Medical History: No date: Anemia     Comment:  only with pregnancy No date: Diabetes mellitus without complication (HCC) No date: GERD (gastroesophageal reflux disease) No date: Headache     Comment:  " I just go to bed when I have a headache" No date: Hypertension   HPI  Hypertension.  Was placed on amlodipine  5 mg daily and valsartan  80mg  [facility], she denies chest pain, shortness of breath, edema   Uncontrolled type 2 diabetes.  Currently on metformin  1000 mg twice daily, Januvia  was ordered but she is not taking the medication due to cost.  States that she follows a low-carb diet, takes atorvastatin  20 mg tablets whenever she remembers to take the medication.  She denies polyuria polyphagia polydipsia. Currently not checking blood sugar at home.   Note: Patient can not afford medications. Only taking amlodipine , metformin  and atorvastatin . Referral placed to pharmacy for medication management.   No Known Allergies  Immunization History  Administered Date(s) Administered   Janssen (J&J) SARS-COV-2 Vaccination 07/24/2019   Tdap 04/11/2016    Tobacco History: Social History   Tobacco Use  Smoking Status Never  Smokeless Tobacco Never   Counseling given: Not Answered   Outpatient Encounter Medications as of 10/16/2023  Medication Sig   amLODipine  (NORVASC ) 5 MG tablet Take 1 tablet (5 mg total) by mouth daily.   atorvastatin  (LIPITOR) 20 MG tablet Take 1 tablet (20 mg total) by mouth daily.   Blood Glucose Monitoring Suppl DEVI 1 each by Does not apply route in the morning, at noon, and at bedtime. May substitute to any manufacturer covered by patient's  insurance.   fluticasone  (FLONASE ) 50 MCG/ACT nasal spray Place 2 sprays into both nostrils daily.   metFORMIN  (GLUCOPHAGE ) 1000 MG tablet Take 1 tablet (1,000 mg total) by mouth 2 (two) times daily with a meal.   sertraline  (ZOLOFT ) 50 MG tablet Take 1 tablet (50 mg total) by mouth daily.   glipiZIDE  (GLUCOTROL ) 5 MG tablet Take 1 tablet (5 mg total) by mouth 2 (two) times daily before a meal. (Patient not taking: Reported on 10/16/2023)   PEG-KCl-NaCl-NaSulf-Na Asc-C (PLENVU ) 140 g SOLR Take 1 kit by mouth as directed. (Patient not taking: Reported on 09/15/2023)   sodium chloride  (OCEAN) 0.65 % SOLN nasal spray Place 1 spray into both nostrils as needed for congestion. (Patient not taking: Reported on 10/16/2023)   valsartan  (DIOVAN ) 80 MG tablet Take 1 tablet (80 mg total) by mouth daily. (Patient not taking: Reported on 10/16/2023)   No facility-administered encounter medications on file as of 10/16/2023.    Review of Systems  Review of Systems  Constitutional: Negative.   HENT: Negative.    Cardiovascular: Negative.   Gastrointestinal: Negative.   Allergic/Immunologic: Negative.   Neurological: Negative.   Psychiatric/Behavioral: Negative.       Objective:   BP (!) 141/71   Pulse 71   Temp 98.4 F (36.9 C) (Oral)   Wt 150 lb 9.6 oz (68.3 kg)   LMP  (LMP Unknown)   SpO2 100%   BMI 25.45 kg/m   Wt Readings from Last  5 Encounters:  10/16/23 150 lb 9.6 oz (68.3 kg)  09/15/23 149 lb (67.6 kg)  06/13/23 151 lb (68.5 kg)  05/16/23 159 lb (72.1 kg)  02/21/23 150 lb 6.4 oz (68.2 kg)     Physical Exam Vitals and nursing note reviewed.  Constitutional:      General: She is not in acute distress.    Appearance: She is well-developed.  Cardiovascular:     Rate and Rhythm: Normal rate and regular rhythm.  Pulmonary:     Effort: Pulmonary effort is normal.     Breath sounds: Normal breath sounds.  Neurological:     Mental Status: She is alert and oriented to person, place, and  time.       Assessment & Plan:   Screen for colon cancer -     Cologuard  Uncontrolled type 2 diabetes mellitus with hyperglycemia, with long-term current use of insulin (HCC) -     AMB Referral [facility]  Essential hypertension -     AMB Referral [facility]     Return in about 8 weeks (around 12/11/2023) for with Fola.    Jessica Morel, NP 10/16/2023

## 2023-10-16 NOTE — Patient Instructions (Signed)
     Dental Resources  [facility] Department   [address]   Cleaning: $5 [place], [place] [address]  Xray: $5 [phone]. [phone]  Call to get on waiting list   Dr. Royston Cornea McMasters/ Dr. Bonnielee Buttery [address]    Xray: $85 each [place], [place] [address]  Extraction $200 [phone]   Dr. Lovena Rubinstein Turner/ Dr. Glorine Laroche  [address]    Exam, Cleaning, Xray: $262 [place]   Extraction: $324-$401 [phone]  Dr. Michiel Ala   [address]   Extraction: $300 per tooth  [place] [address]  [phone]   Dr. Crissie Dome  [address]    Cleaning: $300  [place], [place] [address]  Extraction: [phone]  [place]   Dr. Alessandra Ancona  [address]    Exam: $85 [place], [place]    Extraction: $120 and up  [phone]    *full list of prices available*  [facility] [address]  Exam: $94 [place], [place]    Exam with Xrays: $380  [phone]    Xrays: $68 and up       Cleaning: $101       Extraction $190 and up  [facility]    [address]    Cleaning + Xray: $344 [place]    Extraction: pt must be seen first for price [phone]  [facility] [address]   Emergency Exam: $65 [place] [address]   Cleaning & Exam: $150  [phone]    Extractions: simple $180; surgical $250      Fillings: [id number]

## 2023-10-22 ENCOUNTER — Other Ambulatory Visit: Payer: Self-pay

## 2023-10-22 DIAGNOSIS — E1169 Type 2 diabetes mellitus with other specified complication: Secondary | ICD-10-CM

## 2023-10-22 DIAGNOSIS — E1165 Type 2 diabetes mellitus with hyperglycemia: Secondary | ICD-10-CM

## 2023-10-22 DIAGNOSIS — I10 Essential (primary) hypertension: Secondary | ICD-10-CM

## 2023-10-22 NOTE — Progress Notes (Unsigned)
 10/22/2023 Name: Jessica Garrett MRN: [id number] DOB: 18-Jun-1970  Chief Complaint  Patient presents with   Diabetes   Hypertension   Hyperlipidemia    Jessica Garrett is a 53 y.o. year old female who presented for a telephone visit.  Patient was identified as falling into the [place] Measure - Diabetes.   Patient was: Referred to pharmacy for chronic disease management.   Subjective: She saw PCP, Hillis Lu, NP on 09/15/23. A1C had increased from 8.1% to 8.4%. She reported taking metformin  1000 mg twice daily. She was instructed to start glipizide  5 mg twice daily. She reported intermittent adherence to atorvastatin  20 mg daily. Her BP was 131/67. She was instructed to decrease amlodipine  to 5 mg daily and start valsartan  80 mg daily for kidney protection. She followed up with Tonya on 10/16/23 and reported only taking amlodipine , metformin , and atorvastatin  due to cost.   Today, patient reports doing ok. She confirms that she has not had prescription insurance since January. She has attempted to contact [employer] insurance to be renenrolled but has been unsuccessful. She has never applied to medicaid. She did not pick up glipizide  or valsartan  from the pharmacy.   Care Team: Primary Care Provider: Paseda, Folashade R, FNP ; Next Scheduled Visit: 12/11/23  Medication Access/Adherence  Current Pharmacy:  [facility] [address] [place]), [place] - [address] [place] ([place]) [place] [address] Phone: [phone] Fax: [phone]  [facility] - [facility] [address] [place] [address] Phone: [phone] Fax: [phone]   Patient reports affordability concerns with their medications: Yes - uninsured Patient reports access/transportation concerns to their pharmacy: No  Patient reports adherence concerns with their medications:  Yes  - She takes many of her medications inconsistently. Has not  taken medications for the past 2 days. Reports that she has 1.5 bottles of amlodipine  left though it has not been filled since March 2025 for a 30ds. She stopped taking atorvastatin  but cannot report a reason that she stopped taking it.   Fill history:  - metformin  1000 mg last filled 08/23/23 for 90 ds #180 - atorvastatin  20 mg last filled 02/22/22 for 90ds - amlodipine  10 mg last filled 08/08/23 for 30ds - Valsartan  and glipizide  never filled  Diabetes:  Current medications: metformin  IR 1000 mg BID, glipizide  IR 5 mg BID with meals (has not started yet)  She reports she has been able to get testing supplies, but has not started checking her blood sugar.   Patient denies hypoglycemic s/sx including dizziness, shakiness, sweating. Patient endorses polydipsia, nocturia when she forgets her medication.  Denies hyperglycemic symptoms including polyuria,polyphagia, neuropathy, blurred vision.   Current meal patterns: Has added more fruits and vegetables. Cut out beef, pork.  ~1-2 meals per day - Lunch: sandwich, fruit, leftovers from night before - Supper: chicken, beef, pork, trying to cut back on pasta- has swapped out for wheat pasta, limiting fried foods ~1-2x per week, vegetables (cabbage, collard greens) - Snacks: berries, apples oranges, crackers and cheese, pickles, nuts, tries to avoid cookies - Drinks: water mainly, ginger ale (maybe twice a week), alcohol once or twice a week (1-2 drinks at a time)  Current physical activity: Light walking   Hypertension:  Current medications: amlodipine  10 mg daily, valsartan  80 mg daily (has not started) She does have a machine at home. She has not been checking.    Hyperlipidemia/ASCVD Risk Reduction  Current lipid lowering medications: atorvastatin   20 mg daily (not taking consistently)  Risk Factors: aortic atherosclerosis noted on CT in 2023, DM, HTN   Objective:  BP Readings from Last 3 Encounters:  10/16/23 (!) 141/71  09/15/23  131/67  06/13/23 136/73    Lab Results  Component Value Date   HGBA1C 8.4 (A) 09/15/2023    Lab Results  Component Value Date   CREATININE 0.72 09/15/2023   BUN 12 09/15/2023   NA 138 09/15/2023   K 4.5 09/15/2023   CL 102 09/15/2023   CO2 20 09/15/2023    Lab Results  Component Value Date   CHOL 126 06/15/2023   HDL 47 06/15/2023   LDLCALC 65 06/15/2023   TRIG 66 06/15/2023   CHOLHDL 2.7 06/15/2023    Medications Reviewed Today     Reviewed by Adra Alanis, RPH (Pharmacist) on 10/22/23 at 1318  Med List Status: <None>   Medication Order Taking? Sig Documenting Provider Last Dose Status Informant  amLODipine  (NORVASC ) 5 MG tablet [id number] Yes Take 1 tablet (5 mg total) by mouth daily. Paseda, Folashade R, FNP Taking Active   atorvastatin  (LIPITOR) 20 MG tablet [id number] Yes Take 1 tablet (20 mg total) by mouth daily. Paseda, Folashade R, FNP Taking Active            Med Note Jacquetta Mattocks   Fri Sep 15, 2023  8:24 AM) [place] when she remember to take   Blood Glucose Monitoring Suppl DEVI [id number]  1 each by Does not apply route in the morning, at noon, and at bedtime. May substitute to any manufacturer covered by patient's insurance. Paseda, Folashade R, FNP  Active   fluticasone  (FLONASE ) 50 MCG/ACT nasal spray [id number]  Place 2 sprays into both nostrils daily. Paseda, Folashade R, FNP  Active            Med Note (HERNANDEZ-GARCIA, GEORGINA   Tue Nov 22, 2022  8:55 AM) PRN   glipiZIDE  (GLUCOTROL ) 5 MG tablet [id number] No Take 1 tablet (5 mg total) by mouth 2 (two) times daily before a meal.  Patient not taking: Reported on 10/22/2023   Paseda, Folashade R, FNP Not Taking Active   metFORMIN  (GLUCOPHAGE ) 1000 MG tablet [id number] Yes Take 1 tablet (1,000 mg total) by mouth 2 (two) times daily with a meal. Paseda, Folashade R, FNP Taking Active   PEG-KCl-NaCl-NaSulf-Na Asc-C (PLENVU ) 140 g SOLR [id number]  Take 1 kit by mouth as directed.  Patient not taking: Reported  on 09/15/2023   Nandigam, Kavitha V, MD  Active   sodium chloride  (OCEAN) 0.65 % SOLN nasal spray [id number]  Place 1 spray into both nostrils as needed for congestion.  Patient not taking: Reported on 10/16/2023   Paseda, Folashade R, FNP  Active            Med Note (HERNANDEZ-GARCIA, GEORGINA   Tue Nov 22, 2022  8:55 AM) PRN   valsartan  (DIOVAN ) 80 MG tablet [id number] No Take 1 tablet (80 mg total) by mouth daily.  Patient not taking: Reported on 10/22/2023   Paseda, Folashade R, FNP Not Taking Active               Assessment/Plan:   Diabetes: - Currently uncontrolled with last A1c 8.4% on 09/15/23 above goal <7% likely due to non-adherence to medications. Slightly worsened from last A1C of 8.1%. Will primarily focus on interventions to help with adherence today including sending all medications to [facility], where she may be eligible for [facility] vs  discounted drug prices. Patient is amenable to this plan. She would be a good candidate for SGLT2i given elevated UACR/microalbuminuria - but will focus on adherence before adding additional medications.  - Reviewed long term cardiovascular and renal outcomes of uncontrolled blood sugar - Reviewed goal A1c, goal fasting, and goal 2 hour post prandial glucose - Recommend to take metformin  1000 mg BID as prescribed  - Recommend to start glipizie IR 5 mg BID with meals.  - In the future, consider addition of Farxiga 10  mg daily - if she continues to be uninsured, will need to pursue patient assistance.  - Recommend to check fasting blood glucose daily - A1c due July 2025   Hypertension: - Currently uncontrolled, with last clinic BP 141/71 above goal <130/80. Encouraged her to take amlodipine  as prescribed and start valsartan  80 mg daily for BP and kidney protection. Patient will need repeat BMP after starting ARB to monitor K and SCr. - Reviewed long term cardiovascular and renal outcomes of uncontrolled blood pressure - Recommend  to continue amlodipine  10 mg daily as prescribed  - Recommend to start valsartan  80 mg daily as prescribed    Hyperlipidemia/ASCVD Risk Reduction: - Currently controlled based on last lipid panel with LDL 65 mg/dL, below goal <16 mg/dL given risk factors aortic atherosclerosis, DM, HTN. Emphasized importance of adherence to atorvastatin . Moderate-intensity statin is appropriate. - Reviewed long term complications of uncontrolled cholesterol - Recommend to continue atorvastatin  20 mg daily as prescribed.  Medication Management: - Encouraged patient to apply for Medicaid and provided with information for Medicaid office - Sent all maintenance medications to [facility] supply as she is currently uninsured   Follow Up Plan: PharmD 12/04/23, PCP 12/11/23   Arthea Larsson, PharmD PGY1 Pharmacy Resident

## 2023-10-23 ENCOUNTER — Other Ambulatory Visit: Payer: Self-pay

## 2023-10-23 MED ORDER — ATORVASTATIN CALCIUM 20 MG PO TABS
20.0000 mg | ORAL_TABLET | Freq: Every day | ORAL | 1 refills | Status: DC
  Filled 2023-10-23: qty 90, 90d supply, fill #0
  Filled 2024-02-28: qty 90, 90d supply, fill #1

## 2023-10-23 MED ORDER — AMLODIPINE BESYLATE 5 MG PO TABS
5.0000 mg | ORAL_TABLET | Freq: Every day | ORAL | 1 refills | Status: DC
  Filled 2023-10-23: qty 90, 90d supply, fill #0
  Filled 2024-02-28: qty 90, 90d supply, fill #1

## 2023-10-23 MED ORDER — METFORMIN HCL 1000 MG PO TABS
1000.0000 mg | ORAL_TABLET | Freq: Two times a day (BID) | ORAL | 2 refills | Status: DC
Start: 2023-10-23 — End: 2024-03-18
  Filled 2023-10-23: qty 180, 90d supply, fill #0
  Filled 2024-02-28: qty 180, 90d supply, fill #1

## 2023-10-23 MED ORDER — GLIPIZIDE 5 MG PO TABS
5.0000 mg | ORAL_TABLET | Freq: Two times a day (BID) | ORAL | 1 refills | Status: DC
Start: 2023-10-23 — End: 2023-12-04
  Filled 2023-10-23: qty 180, 90d supply, fill #0

## 2023-10-23 MED ORDER — VALSARTAN 80 MG PO TABS
80.0000 mg | ORAL_TABLET | Freq: Every day | ORAL | 1 refills | Status: DC
  Filled 2023-10-23: qty 90, 90d supply, fill #0
  Filled 2024-02-28: qty 30, 30d supply, fill #1

## 2023-10-31 ENCOUNTER — Emergency Department (HOSPITAL_COMMUNITY)
Admission: EM | Admit: 2023-10-31 | Discharge: 2023-10-31 | Disposition: A | Discharge status: Home / Self Care | Payer: Self-pay | Attending: Emergency Medicine | Admitting: Emergency Medicine

## 2023-10-31 ENCOUNTER — Emergency Department (HOSPITAL_COMMUNITY): Payer: Self-pay

## 2023-10-31 ENCOUNTER — Other Ambulatory Visit: Payer: Self-pay

## 2023-10-31 DIAGNOSIS — M79671 Pain in right foot: Secondary | ICD-10-CM | POA: Insufficient documentation

## 2023-10-31 DIAGNOSIS — M7989 Other specified soft tissue disorders: Secondary | ICD-10-CM | POA: Insufficient documentation

## 2023-10-31 DIAGNOSIS — X501XXA Overexertion from prolonged static or awkward postures, initial encounter: Secondary | ICD-10-CM | POA: Insufficient documentation

## 2023-10-31 MED ORDER — KETOROLAC TROMETHAMINE 15 MG/ML IJ SOLN
15.0000 mg | Freq: Once | INTRAMUSCULAR | Status: AC
  Administered 2023-10-31: 15 mg via INTRAMUSCULAR
  Filled 2023-10-31: qty 1

## 2023-10-31 NOTE — Discharge Instructions (Signed)
 Today you were seen for a right foot injury.  You may rest, ice, compress, and elevate the affected limb.  You may alternate taking Tylenol /Motrin as needed for pain and swelling.  Thank you for letting us  treat you today. After reviewing your imaging, I feel you are safe to go home. Please follow up with your PCP in the next several days and provide them with your records from this visit. Return to the Emergency Room if pain becomes severe or symptoms worsen.

## 2023-10-31 NOTE — ED Triage Notes (Signed)
 Pt twisted her right foot today, is now painful and swelling- unable to bear weight.

## 2023-10-31 NOTE — ED Provider Notes (Signed)
 Jessica Garrett Provider Note   CSN: [id number] Arrival date & time: 10/31/23  1715     Patient presents with: Foot Pain   Jessica Garrett is a 53 y.o. female presents today after twisting her right foot today.  Patient endorses pain and swelling.  Patient states that she is unable to bear weight.    Foot Pain       Prior to Admission medications   Medication Sig Start Date End Date Taking? Authorizing Provider  amLODipine  (NORVASC ) 5 MG tablet Take 1 tablet (5 mg total) by mouth daily. 10/23/23   Paseda, Folashade R, FNP  atorvastatin  (LIPITOR) 20 MG tablet Take 1 tablet (20 mg total) by mouth daily. 10/23/23   Paseda, Folashade R, FNP  Blood Glucose Monitoring Suppl DEVI 1 each by Does not apply route in the morning, at noon, and at bedtime. May substitute to any manufacturer covered by patient's insurance. 07/10/23   Paseda, Folashade R, FNP  fluticasone  (FLONASE ) 50 MCG/ACT nasal spray Place 2 sprays into both nostrils daily. 04/18/22   Paseda, Folashade R, FNP  glipiZIDE  (GLUCOTROL ) 5 MG tablet Take 1 tablet (5 mg total) by mouth 2 (two) times daily before a meal. 10/23/23   Paseda, Folashade R, FNP  metFORMIN  (GLUCOPHAGE ) 1000 MG tablet Take 1 tablet (1,000 mg total) by mouth 2 (two) times daily with a meal. 10/23/23   Paseda, Folashade R, FNP  PEG-KCl-NaCl-NaSulf-Na Asc-C (PLENVU ) 140 g SOLR Take 1 kit by mouth as directed. Patient not taking: Reported on 09/15/2023 06/23/21   Nandigam, Kavitha V, MD  sodium chloride  (OCEAN) 0.65 % SOLN nasal spray Place 1 spray into both nostrils as needed for congestion. Patient not taking: Reported on 10/16/2023 04/18/22   Paseda, Folashade R, FNP  valsartan  (DIOVAN ) 80 MG tablet Take 1 tablet (80 mg total) by mouth daily. 10/23/23   Paseda, Folashade R, FNP    Allergies: Patient has no known allergies.    Review of Systems  Musculoskeletal:  Positive for arthralgias.    Updated Vital Signs BP 135/76 (BP  Location: Left Arm)   Pulse 91   Temp 98.4 F (36.9 C) (Oral)   Resp 18   LMP  (LMP Unknown)   SpO2 100%   Physical Exam Vitals and nursing note reviewed.  Constitutional:      General: She is not in acute distress.    Appearance: Normal appearance. She is well-developed. She is not ill-appearing.  HENT:     Head: Normocephalic and atraumatic.     Right Ear: External ear normal.     Left Ear: External ear normal.     Nose: Nose normal.     Mouth/Throat:     Mouth: Mucous membranes are moist.     Pharynx: Oropharynx is clear.   Eyes:     Conjunctiva/sclera: Conjunctivae normal.    Cardiovascular:     Rate and Rhythm: Normal rate and regular rhythm.     Heart sounds: No murmur heard. Pulmonary:     Effort: Pulmonary effort is normal. No respiratory distress.     Breath sounds: Normal breath sounds.  Abdominal:     Palpations: Abdomen is soft.     Tenderness: There is no abdominal tenderness.   Musculoskeletal:        General: Swelling and tenderness present. No deformity.     Cervical back: Neck supple.     Comments: Patient with tenderness to palpation of the proximal dorsal right foot with  mild swelling.  No tenderness to palpation of the posterior medial or lateral malleoli.  Patient neurovascularly intact.  +2 dorsalis pedis pulses.   Skin:    General: Skin is warm and dry.     Capillary Refill: Capillary refill takes less than 2 seconds.   Neurological:     General: No focal deficit present.     Mental Status: She is alert.   Psychiatric:        Mood and Affect: Mood normal.     (all labs ordered are listed, but only abnormal results are displayed) Labs Reviewed - No data to display  EKG: None  Radiology: DG Foot Complete Right Result Date: 10/31/2023 CLINICAL DATA:  Twisting injury foot pain EXAM: RIGHT FOOT COMPLETE - 3+ VIEW COMPARISON:  None Available. FINDINGS: There is no evidence of fracture or dislocation. There is no evidence of arthropathy or  other focal bone abnormality. Soft tissues are unremarkable. IMPRESSION: Negative. Electronically Signed   By: Esmeralda Hedge M.D.   On: 10/31/2023 17:50     Procedures   Medications Ordered in the ED  ketorolac  (TORADOL ) 15 MG/ML injection 15 mg (15 mg Intramuscular Given 10/31/23 1819)                                    Medical Decision Making Amount and/or Complexity of Data Reviewed Radiology: ordered.  Risk Prescription drug management.   This patient presents to the ED for concern of right foot injury differential diagnosis includes musculoskeletal pain, fracture, dislocation  Imaging Studies ordered:  I ordered imaging studies including right foot x-ray I independently visualized and interpreted imaging which showed negative I agree with the radiologist interpretation   Medicines ordered and prescription drug management:  I ordered medication including Toradol     I have reviewed the patients home medicines and have made adjustments as needed   Problem List / ED Course:  Patient placed in lace up brace and given crutches as she states she cannot bear weight Considered for admission or further workup however patient's vital signs, physical exam, and imaging been reassuring.  Patient advised to rest, ice, compress, and elevate affected limb.  Patient may take Tylenol  Motrin as needed for pain and swelling.  Patient given return precautions.  I feel patient is safe for discharge at this time.      Final diagnoses:  Foot pain, right    ED Discharge Orders     None          Merryl Abraham 10/31/23 1829    Iva Mariner, MD 10/31/23 [phone]

## 2023-10-31 NOTE — Progress Notes (Signed)
 Orthopedic Tech Progress Note Patient Details:  Jessica Garrett 17-Feb-1971 [id number]  Ortho Devices Type of Ortho Device: ASO Ortho Device/Splint Location: right Ortho Device/Splint Interventions: Ordered, Application, Adjustment   Post Interventions Patient Tolerated: Well Instructions Provided: Adjustment of device, Care of device  [name] 10/31/2023, 6:23 PM

## 2023-11-01 ENCOUNTER — Other Ambulatory Visit: Payer: Self-pay

## 2023-11-01 ENCOUNTER — Telehealth: Payer: Self-pay

## 2023-11-01 NOTE — Transitions of Care (Post Inpatient/ED Visit) (Signed)
 11/01/2023  Name: Jessica Garrett MRN: [id number] DOB: 1970-11-01  Today's TOC FU Call Status:   Patient's Name and Date of Birth confirmed.  Transition Care Management Follow-up Telephone Call Date of Discharge: 10/31/23 Discharge Facility: [facility]) Type of Discharge: Emergency Department Reason for ED Visit: Other: How have you been since you were released from the hospital?: Better Any questions or concerns?: No  Items Reviewed: Did you receive and understand the discharge instructions provided?: No Medications obtained,verified, and reconciled?: No Any new allergies since your discharge?: No Dietary orders reviewed?: No Do you have support at home?: Yes People in Home [RPT]: child(ren), adult  Medications Reviewed Today: Medications Reviewed Today     Reviewed by Angelita Bares, CMA (Certified Medical Assistant) on 11/01/23 at 1137  Med List Status: <None>   Medication Order Taking? Sig Documenting Provider Last Dose Status Informant  amLODipine  (NORVASC ) 5 MG tablet [id number] Yes Take 1 tablet (5 mg total) by mouth daily. Paseda, Folashade R, FNP  Active   atorvastatin  (LIPITOR) 20 MG tablet [id number] Yes Take 1 tablet (20 mg total) by mouth daily. Paseda, Folashade R, FNP  Active   Blood Glucose Monitoring Suppl DEVI [id number] Yes 1 each by Does not apply route in the morning, at noon, and at bedtime. May substitute to any manufacturer covered by patient's insurance. Paseda, Folashade R, FNP  Active   fluticasone  (FLONASE ) 50 MCG/ACT nasal spray [id number] Yes Place 2 sprays into both nostrils daily. Paseda, Folashade R, FNP  Active            Med Note (HERNANDEZ-GARCIA, GEORGINA   Tue Nov 22, 2022  8:55 AM) PRN   glipiZIDE  (GLUCOTROL ) 5 MG tablet [id number] Yes Take 1 tablet (5 mg total) by mouth 2 (two) times daily before a meal. Paseda, Folashade R, FNP  Active   metFORMIN  (GLUCOPHAGE ) 1000 MG tablet [id number] Yes Take 1 tablet (1,000 mg total) by mouth 2 (two)  times daily with a meal. Paseda, Folashade R, FNP  Active   PEG-KCl-NaCl-NaSulf-Na Asc-C (PLENVU ) 140 g SOLR [id number]  Take 1 kit by mouth as directed.  Patient not taking: Reported on 09/15/2023   Nandigam, Kavitha V, MD  Active   sodium chloride  (OCEAN) 0.65 % SOLN nasal spray [id number]  Place 1 spray into both nostrils as needed for congestion.  Patient not taking: Reported on 10/16/2023   Paseda, Folashade R, FNP  Active            Med Note (HERNANDEZ-GARCIA, GEORGINA   Tue Nov 22, 2022  8:55 AM) PRN   valsartan  (DIOVAN ) 80 MG tablet [id number] Yes Take 1 tablet (80 mg total) by mouth daily. Paseda, Folashade R, FNP  Active             Home Care and Equipment/Supplies: Were Home Health Services Ordered?: NA Any new equipment or medical supplies ordered?: NA  Functional Questionnaire: Do you need assistance with bathing/showering or dressing?: No Do you need assistance with meal preparation?: No Do you need assistance with eating?: No Do you have difficulty maintaining continence: No Do you need assistance with getting out of bed/getting out of a chair/moving?: No Do you have difficulty managing or taking your medications?: No  Follow up appointments reviewed: PCP Follow-up appointment confirmed?: Yes Date of PCP follow-up appointment?: 11/14/23 Specialist Hospital Follow-up appointment confirmed?: NA Do you need transportation to your follow-up appointment?: No Do you understand care options if your condition(s) worsen?: Yes-patient verbalized understanding    SIGNATURE Tahje Borawski  Conlin Brahm, RMA

## 2023-11-14 ENCOUNTER — Other Ambulatory Visit: Payer: Self-pay

## 2023-11-14 ENCOUNTER — Encounter: Payer: Self-pay | Admitting: Nurse Practitioner

## 2023-11-14 ENCOUNTER — Ambulatory Visit (INDEPENDENT_AMBULATORY_CARE_PROVIDER_SITE_OTHER): Payer: Self-pay | Admitting: Nurse Practitioner

## 2023-11-14 VITALS — BP 129/81 | HR 71 | Temp 96.5°F | Wt 150.0 lb

## 2023-11-14 DIAGNOSIS — F419 Anxiety disorder, unspecified: Secondary | ICD-10-CM | POA: Insufficient documentation

## 2023-11-14 DIAGNOSIS — I10 Essential (primary) hypertension: Secondary | ICD-10-CM

## 2023-11-14 DIAGNOSIS — M545 Low back pain, unspecified: Secondary | ICD-10-CM | POA: Insufficient documentation

## 2023-11-14 DIAGNOSIS — E785 Hyperlipidemia, unspecified: Secondary | ICD-10-CM

## 2023-11-14 DIAGNOSIS — Z09 Encounter for follow-up examination after completed treatment for conditions other than malignant neoplasm: Secondary | ICD-10-CM | POA: Insufficient documentation

## 2023-11-14 DIAGNOSIS — E1165 Type 2 diabetes mellitus with hyperglycemia: Secondary | ICD-10-CM

## 2023-11-14 DIAGNOSIS — F32A Depression, unspecified: Secondary | ICD-10-CM

## 2023-11-14 DIAGNOSIS — Z794 Long term (current) use of insulin: Secondary | ICD-10-CM

## 2023-11-14 DIAGNOSIS — G629 Polyneuropathy, unspecified: Secondary | ICD-10-CM | POA: Insufficient documentation

## 2023-11-14 DIAGNOSIS — M79671 Pain in right foot: Secondary | ICD-10-CM

## 2023-11-14 MED ORDER — DULOXETINE HCL 30 MG PO CPEP
30.0000 mg | ORAL_CAPSULE | Freq: Every day | ORAL | 1 refills | Status: DC
  Filled 2023-11-14: qty 30, 30d supply, fill #0

## 2023-11-14 NOTE — Patient Instructions (Addendum)
 Goal for fasting blood sugar ranges from 80 to 120 and 2 hours after any meal or at bedtime should be between 130 to 170.     SABRA Neuropathy . Anxiety and depression  - DULoxetine (CYMBALTA) 30 MG capsule; Take 1 capsule (30 mg total) by mouth daily.  Take 30 mg daily for 1 week after 1 week increase to 60 mg daily  It is important that you exercise regularly at least 30 minutes 5 times a week as tolerated  Think about what you will eat, plan ahead. Choose  clean, green, fresh or frozen over canned, processed or packaged foods which are more sugary, salty and fatty. 70 to 75% of food eaten should be vegetables and fruit. Three meals at set times with snacks allowed between meals, but they must be fruit or vegetables. Aim to eat over a 12 hour period , example 7 am to 7 pm, and STOP after  your last meal of the day. Drink water,generally about 64 ounces per day, no other drink is as healthy. Fruit juice is best enjoyed in a healthy way, by EATING the fruit.  Thanks for choosing [facility] we consider it a privelige to serve you.

## 2023-11-14 NOTE — Assessment & Plan Note (Signed)
 Declined Toradol  injection in the office today Continue OTC Motrin and Tylenol  Stretching exercises application of heat encouraged

## 2023-11-14 NOTE — Assessment & Plan Note (Signed)
    11/14/2023    9:08 AM 09/15/2023    8:00 AM 06/13/2023    3:15 PM  Depression screen PHQ 2/9  Decreased Interest 0 0 0  Down, Depressed, Hopeless 0 0 0  PHQ - 2 Score 0 0 0  Altered sleeping 2 0 0  Tired, decreased energy 2 0 0  Change in appetite 0 3 0  Feeling bad or failure about yourself  0 0 0  Trouble concentrating 0 1 0  Moving slowly or fidgety/restless 3 0 0  Suicidal thoughts 0 0 0  PHQ-9 Score 7 4 0  Difficult doing work/chores Somewhat difficult Not difficult at all Not difficult at all       11/14/2023    9:11 AM 09/15/2023    8:01 AM 06/13/2023    3:16 PM 02/21/2023    9:33 AM  GAD 7 : Generalized Anxiety Score  Nervous, Anxious, on Edge 1 0 0 0  Control/stop worrying 3 0 0 1  Worry too much - different things 3 0 0 1  Trouble relaxing 2 0 0 1  Restless 1 0 0 0  Easily annoyed or irritable 3 0 0 0  Afraid - awful might happen 0 0 0 0  Total GAD 7 Score 13 0 0 3  Anxiety Difficulty Somewhat difficult Not difficult at all Not difficult at all Not difficult at all  Start Cymbalta 30 mg daily after 1 week increase to 60 mg daily Follow-up as planned

## 2023-11-14 NOTE — Assessment & Plan Note (Signed)
 Hospital chart reviewed, including discharge summary Medications reconciled and reviewed with the patient in detail

## 2023-11-14 NOTE — Assessment & Plan Note (Signed)
 Continue glipizide  5 mg twice daily, metformin  1000 mg twice daily Patient counseled on low-carb diet She does not monitor her blood sugar, signs of hypoglycemia reviewed CBG goals provided encouraged to monitor blood sugar if she experiences any sign of hypoglycemia Appreciate collaboration with the clinical pharmacist Will recheck A1c at next visit

## 2023-11-14 NOTE — Assessment & Plan Note (Signed)
 Started duloxetine 30 mg daily Checking B12

## 2023-11-14 NOTE — Assessment & Plan Note (Signed)
 On atorvastatin  20 mg daily Will check lipid panel at next visit
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
## 2023-11-14 NOTE — Assessment & Plan Note (Signed)
 Controlled on valsartan  80 mg daily, amlodipine  5 mg daily, Continue current medications DASH diet and commitment to daily physical activity for a minimum of 30 minutes discussed and encouraged, as a part of hypertension management. BMP next week     11/14/2023    9:04 AM 10/31/2023    5:21 PM 10/16/2023    9:39 AM 10/16/2023    9:22 AM 09/15/2023    7:58 AM 06/13/2023    4:07 PM 06/13/2023    3:19 PM  BP/Weight  Systolic BP 129 135 141 154 131 136 135  Diastolic BP 81 76 71 85 67 73 73  Wt. (Lbs) 150   150.6 149    BMI 25.35 kg/m2   25.45 kg/m2 25.18 kg/m2

## 2023-11-16 ENCOUNTER — Other Ambulatory Visit: Payer: Self-pay

## 2023-11-21 ENCOUNTER — Other Ambulatory Visit (INDEPENDENT_AMBULATORY_CARE_PROVIDER_SITE_OTHER): Payer: Self-pay

## 2023-11-21 DIAGNOSIS — I10 Essential (primary) hypertension: Secondary | ICD-10-CM

## 2023-11-21 DIAGNOSIS — G629 Polyneuropathy, unspecified: Secondary | ICD-10-CM

## 2023-11-22 LAB — BASIC METABOLIC PANEL WITH GFR
BUN/Creatinine Ratio: 17 (ref 9–23)
BUN: 15 mg/dL (ref 6–24)
CO2: 24 mmol/L (ref 20–29)
Calcium: 10.1 mg/dL (ref 8.7–10.2)
Chloride: 104 mmol/L (ref 96–106)
Creatinine, Ser: 0.88 mg/dL (ref 0.57–1.00)
Glucose: 160 mg/dL — ABNORMAL HIGH (ref 70–99)
Potassium: 4.6 mmol/L (ref 3.5–5.2)
Sodium: 143 mmol/L (ref 134–144)
eGFR: 78 mL/min/1.73 (ref >59)

## 2023-11-22 LAB — VITAMIN B12: Vitamin B-12: 589 pg/mL (ref 232–1245)

## 2023-11-27 ENCOUNTER — Ambulatory Visit: Payer: Self-pay | Admitting: Nurse Practitioner

## 2023-11-27 ENCOUNTER — Telehealth: Payer: Self-pay | Admitting: Nurse Practitioner

## 2023-11-27 NOTE — Telephone Encounter (Signed)
 Patient was identified as falling into the [place] Measure - Diabetes.   Patient was: Appointment already scheduled for:  12/11/23.

## 2023-12-04 ENCOUNTER — Other Ambulatory Visit: Payer: Self-pay

## 2023-12-04 DIAGNOSIS — E1165 Type 2 diabetes mellitus with hyperglycemia: Secondary | ICD-10-CM

## 2023-12-04 MED ORDER — GLIPIZIDE 5 MG PO TABS
5.0000 mg | ORAL_TABLET | Freq: Every day | ORAL | 1 refills | Status: DC
  Filled 2023-12-04: qty 90, 90d supply, fill #0

## 2023-12-04 NOTE — Progress Notes (Signed)
 12/04/2023 Name: Maranatha Gehrig MRN: [id number] DOB: 05/24/1970  Chief Complaint  Patient presents with   Diabetes   Hyperlipidemia   Hypertension    Zannah Accardi is a 53 y.o. year old female who presented for a telephone visit.  Patient was identified as falling into the [place] Measure - Diabetes.   Patient was: Referred to pharmacy for chronic disease management.   Subjective: She saw PCP, Lorice Shall, NP on 09/15/23. A1C had increased from 8.1% to 8.4%. She reported taking metformin  1000 mg twice daily. She was instructed to start glipizide  5 mg twice daily. She reported intermittent adherence to atorvastatin  20 mg daily. Her BP was 131/67. She was instructed to decrease amlodipine  to 5 mg daily and start valsartan  80 mg daily for kidney protection. She followed up with Tonya on 10/16/23 and reported only taking amlodipine , metformin , and atorvastatin  due to cost. She was last engaged by pharmacy on 10/22/23 and all her medications were sent to [facility] for affordability. She followed up with her PCP on 11/21/23 and reported taking all her medications as prescribed. Duloxetine  was initiated for mood and neuropathy.  Today, patient reports doing ok. She reports that after she sprained her ankle her neuropathy spread to her whole body. 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 She feels the duloxetine  is helping a little bit, but is interested in increasing the dose. She reports her medications were affordable at [facility].   Care Team: Primary Care Provider: Paseda, Folashade R, FNP ; Next Scheduled Visit: 12/11/23  Medication Access/Adherence  Current Pharmacy:  [facility] [address], [address] [place] [address] Phone: [phone] Fax: [phone]   Patient reports affordability concerns with their medications: Yes -  uninsured Patient reports access/transportation concerns to their pharmacy: No  Patient reports adherence concerns with their medications:  Yes  - She uses a pill box. She has been adherent to her medications since picking them up from [facility] on 11/02/23.  Fill history: all maintenance meds last filled on 11/02/23 for 90ds at [facility]  Diabetes:  Current medications: metformin  IR 1000 mg BID, glipizide  IR 5 mg BID with meals (taking once daily - was unaware it was BID dosing).   She reports she has been able to get testing supplies, but has not started checking her blood sugar. She asks about a blood sugar watch - we discussed CGM and she reported this would be too expensive for her. Of note, patient may qualify for LIBERATE study in the future if her A1C remains above 8%.   Patient endorses 1-2x episodes of hypoglycemic s/sx including dizziness, shakiness, sweating a couple weeks. Patient endorses polydipsia, nocturia when she forgets her medication.  Denies hyperglycemic symptoms including polyuria,polyphagia, neuropathy, blurred vision.   Current meal patterns: Has added more fruits and vegetables. Cut out beef, pork.  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 [facility]   Hypertension:  Current medications: amlodipine  10 mg daily, valsartan  80 mg daily She does have a machine at home. She has not been checking.    Hyperlipidemia/ASCVD Risk Reduction  Current lipid lowering medications: atorvastatin   20 mg daily   Risk Factors: aortic atherosclerosis noted on CT in 2023, DM, HTN   Objective:  BP Readings from Last 3 Encounters:  11/14/23 129/81  10/31/23 135/76  10/16/23 (!) 141/71    Lab Results  Component Value Date   HGBA1C 8.4 (A) 09/15/2023    Lab Results  Component Value Date   CREATININE 0.88 11/21/2023   BUN 15 11/21/2023   NA 143 11/21/2023   K 4.6 11/21/2023   CL 104 11/21/2023   CO2 24 11/21/2023    Lab Results  Component Value Date   CHOL 126 06/15/2023   HDL 47 06/15/2023   LDLCALC 65 06/15/2023   TRIG 66 06/15/2023   CHOLHDL 2.7 06/15/2023    Medications Reviewed Today     Reviewed by Brinda Lorain SQUIBB, RPH (Pharmacist) on 12/04/23 at 1420  Med List Status: <None>   Medication Order Taking? Sig Documenting Provider Last Dose Status Informant  amLODipine  (NORVASC ) 5 MG tablet [id number] Yes Take 1 tablet (5 mg total) by mouth daily. Paseda, Folashade R, FNP  Active   atorvastatin  (LIPITOR) 20 MG tablet [id number] Yes Take 1 tablet (20 mg total) by mouth daily. Paseda, Folashade R, FNP  Active   Blood Glucose Monitoring Suppl DEVI [id number]  1 each by Does not apply route in the morning, at noon, and at bedtime. May substitute to any manufacturer covered by patient's insurance. Paseda, Folashade R, FNP  Active   DULoxetine  (CYMBALTA ) 30 MG capsule [id number] Yes Take 1 capsule (30 mg total) by mouth daily. Paseda, Folashade R, FNP  Active   fluticasone  (FLONASE ) 50 MCG/ACT nasal spray [id number]  Place 2 sprays into both nostrils daily.  Patient not taking: Reported on 11/14/2023   Paseda, Folashade R, FNP  Active            Med Note (HERNANDEZ-GARCIA, GEORGINA   Tue Nov 22, 2022  8:55 AM) PRN   glipiZIDE  (GLUCOTROL ) 5 MG tablet [id number] Yes Take 1 tablet (5 mg total) by mouth 2 (two) times daily before a meal. Paseda, Folashade R, FNP  Active   metFORMIN  (GLUCOPHAGE ) 1000 MG tablet [id number] Yes Take 1 tablet (1,000 mg total) by mouth 2 (two) times daily  with a meal. Paseda, Folashade R, FNP  Active    Patient not taking:   Discontinued 12/04/23 1420   sodium chloride  (OCEAN) 0.65 % SOLN nasal spray [id number]  Place 1 spray into both nostrils as needed for congestion.  Patient not taking: Reported on 11/14/2023   Paseda, Folashade R, FNP  Active            Med Note (HERNANDEZ-GARCIA, GEORGINA   Tue Nov 22, 2022  8:55 AM) PRN   valsartan  (DIOVAN ) 80 MG tablet [id number] Yes Take 1 tablet (80 mg total) by mouth daily. Paseda, Folashade R, FNP  Active               Assessment/Plan:   Diabetes: - Currently uncontrolled with last A1c 8.4% on 09/15/23 above goal <7% likely due to non-adherence to medications. Slightly worsened from last A1C of 8.1%. Expect improvement with improved adherence. Patient has experienced 2 episodes consistent with s/sx of hypoglycemia. Will continue glipizide  5 mg once daily with first meal (instead of  with her evening meal) to reduce risk of hypoglycemia. She would be a good candidate for SGLT2i or GLP-1RA given elevated UACR/microalbuminuria if next A1C remains > 7%.  - Reviewed long term cardiovascular and renal outcomes of uncontrolled blood sugar - Reviewed goal A1c, goal fasting, and goal 2 hour post prandial glucose - Reviewed dietary modifications including utilizing the healthy plate method, limiting portion size of carbohydrate foods, increasing intake of protein and non-starchy vegetables. Counseled patient to stay hydrated with water throughout the day. - Reviewed lifestyle modifications including: aiming for 150 minutes of moderate intensity exercise every week.  - Recommend to continue metformin  1000 mg BID  - Recommend to reduce glipizide  to 5 mg once daily with the first meal of the day (lunch) - In the future, consider addition of Farxiga 10  mg daily - if she continues to be uninsured, will need to pursue patient assistance.  - Recommend to check fasting blood glucose daily. Advised to bring glucometer  to next appointment if she needs assistance with setting it up.  - A1c due August 2025   Hypertension: - Currently controlled, with last clinic BP 129/81 mmHg at goal <130/80. Repeat BMP after starting valsartan  demonstrated stable Scr and K WNL. Appropriate to continue current regimen at this time.  - Reviewed long term cardiovascular and renal outcomes of uncontrolled blood pressure - Recommend to continue amlodipine  10 mg daily and valsartan  80 mg daily   Hyperlipidemia/ASCVD Risk Reduction: - Currently controlled based on last lipid panel with LDL 65 mg/dL, below goal <29 mg/dL given risk factors aortic atherosclerosis, DM, HTN. Emphasized importance of adherence to atorvastatin . Moderate-intensity statin is appropriate. - Reviewed long term complications of uncontrolled cholesterol - Recommend to continue atorvastatin  20 mg daily as prescribed.   Advised patient again to apply for Medicaid at [facility] office.    Follow Up Plan: PCP 12/11/23, PharmD telephone 01/10/24   Lorain Baseman, PharmD [facility] [phone]

## 2023-12-11 ENCOUNTER — Other Ambulatory Visit: Payer: Self-pay

## 2023-12-11 ENCOUNTER — Ambulatory Visit (INDEPENDENT_AMBULATORY_CARE_PROVIDER_SITE_OTHER): Payer: Self-pay | Admitting: Nurse Practitioner

## 2023-12-11 VITALS — BP 138/75 | HR 72 | Temp 96.8°F | Wt 146.0 lb

## 2023-12-11 DIAGNOSIS — Z794 Long term (current) use of insulin: Secondary | ICD-10-CM

## 2023-12-11 DIAGNOSIS — G629 Polyneuropathy, unspecified: Secondary | ICD-10-CM

## 2023-12-11 DIAGNOSIS — I10 Essential (primary) hypertension: Secondary | ICD-10-CM

## 2023-12-11 DIAGNOSIS — E1165 Type 2 diabetes mellitus with hyperglycemia: Secondary | ICD-10-CM

## 2023-12-11 DIAGNOSIS — E785 Hyperlipidemia, unspecified: Secondary | ICD-10-CM

## 2023-12-11 DIAGNOSIS — F419 Anxiety disorder, unspecified: Secondary | ICD-10-CM

## 2023-12-11 DIAGNOSIS — F32A Depression, unspecified: Secondary | ICD-10-CM

## 2023-12-11 DIAGNOSIS — R051 Acute cough: Secondary | ICD-10-CM | POA: Insufficient documentation

## 2023-12-11 LAB — POCT INFLUENZA A/B
Influenza A, POC: NEGATIVE
Influenza B, POC: NEGATIVE

## 2023-12-11 LAB — POCT GLYCOSYLATED HEMOGLOBIN (HGB A1C): Hemoglobin A1C: 7.7 % — AB (ref 4.0–5.6)

## 2023-12-11 LAB — POC COVID19 BINAXNOW: SARS Coronavirus 2 Ag: NEGATIVE

## 2023-12-11 MED ORDER — DULOXETINE HCL 30 MG PO CPEP
60.0000 mg | ORAL_CAPSULE | Freq: Every day | ORAL | 1 refills | Status: AC
  Filled 2023-12-11: qty 120, 60d supply, fill #0
  Filled 2024-02-28: qty 60, 30d supply, fill #1

## 2023-12-11 MED ORDER — DULOXETINE HCL 30 MG PO CPEP
30.0000 mg | ORAL_CAPSULE | Freq: Every day | ORAL | 1 refills | Status: DC
  Filled 2023-12-11: qty 90, 90d supply, fill #0

## 2023-12-11 MED ORDER — GLIPIZIDE 5 MG PO TABS
5.0000 mg | ORAL_TABLET | Freq: Two times a day (BID) | ORAL | 2 refills | Status: AC
  Filled 2023-12-11: qty 120, 60d supply, fill #0
  Filled 2024-02-28: qty 60, 30d supply, fill #0
  Filled 2024-06-12: qty 60, 30d supply, fill #1

## 2023-12-11 NOTE — Patient Instructions (Signed)
 Goal for fasting blood sugar ranges from 80 to 120 and 2 hours after any meal or at bedtime should be between 130 to 170.    Around 3 times per week, check your blood pressure 2 times per day. once in the morning and once in the evening. The readings should be at least one minute apart. Write down these values and bring them to your next nurse visit/appointment.  When you check your BP, make sure you have been doing something calm/relaxing 5 minutes prior to checking. Both feet should be flat on the floor and you should be sitting. Use your left arm and make sure it is in a relaxed position (on a table), and that the cuff is at the approximate level/height of your heart.  Blood pressure goal is less than 130/80.   1. Uncontrolled type 2 diabetes mellitus with hyperglycemia, with long-term current use of insulin (HCC) (Primary)  - POCT glycosylated hemoglobin (Hb A1C) - glipiZIDE  (GLUCOTROL ) 5 MG tablet; Take 1 tablet (5 mg total) by mouth 2 (two) times daily before a meal.  Dispense: 120 tablet; Refill: 2  2. Anxiety and depression  - DULoxetine  (CYMBALTA ) 30 MG capsule; Take 2 capsules (60 mg total) by mouth daily.  Dispense: 120 capsule; Refill: 1  3. Neuropathy  - DULoxetine  (CYMBALTA ) 30 MG capsule; Take 2 capsules (60 mg total) by mouth daily.  Dispense: 120 capsule; Refill:     It is important that you exercise regularly at least 30 minutes 5 times a week as tolerated  Think about what you will eat, plan ahead. Choose  clean, green, fresh or frozen over canned, processed or packaged foods which are more sugary, salty and fatty. 70 to 75% of food eaten should be vegetables and fruit. Three meals at set times with snacks allowed between meals, but they must be fruit or vegetables. Aim to eat over a 12 hour period , example 7 am to 7 pm, and STOP after  your last meal of the day. Drink water,generally about 64 ounces per day, no other drink is as healthy. Fruit juice is best enjoyed  in a healthy way, by EATING the fruit.  Thanks for choosing [facility] we consider it a privelige to serve you.

## 2023-12-11 NOTE — Assessment & Plan Note (Addendum)
 Feels like her neuropathy is now spreading through body Currently on duloxetine  30 mg daily Start duloxetine  60 mg daily

## 2023-12-11 NOTE — Assessment & Plan Note (Signed)
 Lab Results  Component Value Date   HGBA1C 7.7 (A) 12/11/2023  A1c improved from 8.4 about 3 months ago Stated that she has started taking glipizide  twice daily since her last visit with the clinical pharmacist, stated that prior to that she was taking 5 mg daily Taking metformin  1000 mg twice daily Has a glucometer she does not check her blood sugar Patient encouraged to check blood sugar if she experiences any sign of hypoglycemia, signs of hypoglycemia reviewed Continue current medications Appreciate collaboration with the clinical pharmacist  Before was taking glipizide  oneca dya , started BID isnec last vist with pharamcy

## 2023-12-11 NOTE — Progress Notes (Signed)
 Established Patient Office Visit  Subjective:  Patient ID: Jessica Garrett, female    DOB: [birth year]  Age: 53 y.o. MRN: [id number]  CC:  Chief Complaint  Patient presents with   Diabetes    HPI Jessica Garrett is a 53 y.o. female  has a past medical history of Anemia, Diabetes mellitus without complication (HCC), GERD (gastroesophageal reflux disease), Headache, and Hypertension.  Patient presents for follow-up for her anxiety and depression Please see assessment and plan section for full HPI and plan   Past Medical History:  Diagnosis Date   Anemia    only with pregnancy   Diabetes mellitus without complication (HCC)    GERD (gastroesophageal reflux disease)    Headache     I just go to bed when I have a headache   Hypertension     Past Surgical History:  Procedure Laterality Date   CHOLECYSTECTOMY N/A 04/26/2016   Procedure: LAPAROSCOPIC CHOLECYSTECTOMY;  Surgeon: Lynda Leos, MD;  Location: [facility] OR;  Service: General;  Laterality: N/A;   TUBAL LIGATION  1992   VAGINAL DELIVERY     x3   WISDOM TOOTH EXTRACTION     only on the R side , pt. reports that she was a wake for the procedure     Family History  Problem Relation Age of Onset   Heart disease Mother    Colon cancer Father    Hypertension Father    Diabetes Maternal Grandmother    Diabetes Brother    Hypertension Brother    Colon polyps Neg Hx    Esophageal cancer Neg Hx    Rectal cancer Neg Hx    Stomach cancer Neg Hx    Breast cancer Neg Hx     Social History   Socioeconomic History   Marital status: Single    Spouse name: Not on file   Number of children: Not on file   Years of education: Not on file   Highest education level: Not on file  Occupational History   Not on file  Tobacco Use   Smoking status: Never   Smokeless tobacco: Never  Vaping Use   Vaping status: Never Used  Substance and Sexual Activity   Alcohol use: Yes    Comment: occasional   Drug use: No   Sexual activity:  Yes    Birth control/protection: None  Other Topics Concern   Not on file  Social History Narrative   Pt stated she is a social drinker   Social Drivers of Corporate investment banker Strain: Not on file  Food Insecurity: No Food Insecurity (10/16/2023)   Hunger Vital Sign    Worried About Running Out of Food in the Last Year: Never true    Ran Out of Food in the Last Year: Never true  Transportation Needs: No Transportation Needs (10/16/2023)   PRAPARE - Administrator, Civil Service (Medical): No    Lack of Transportation (Non-Medical): No  Physical Activity: Not on file  Stress: Not on file  Social Connections: Not on file  Intimate Partner Violence: Not on file    Outpatient Medications Prior to Visit  Medication Sig Dispense Refill   amLODipine  (NORVASC ) 5 MG tablet Take 1 tablet (5 mg total) by mouth daily. 90 tablet 1   atorvastatin  (LIPITOR) 20 MG tablet Take 1 tablet (20 mg total) by mouth daily. 90 tablet 1   metFORMIN  (GLUCOPHAGE ) 1000 MG tablet Take 1 tablet (1,000 mg total) by mouth 2 (two)  times daily with a meal. 180 tablet 2   valsartan  (DIOVAN ) 80 MG tablet Take 1 tablet (80 mg total) by mouth daily. 90 tablet 1   DULoxetine  (CYMBALTA ) 30 MG capsule Take 1 capsule (30 mg total) by mouth daily. 90 capsule 1   glipiZIDE  (GLUCOTROL ) 5 MG tablet Take 1 tablet (5 mg total) by mouth daily before breakfast. (Patient taking differently: Take 1 tablet (5 mg total) by mouth daily before breakfast.) 90 tablet 1   Blood Glucose Monitoring Suppl DEVI 1 each by Does not apply route in the morning, at noon, and at bedtime. May substitute to any manufacturer covered by patient's insurance. (Patient not taking: Reported on 12/11/2023) 1 each 0   fluticasone  (FLONASE ) 50 MCG/ACT nasal spray Place 2 sprays into both nostrils daily. (Patient not taking: Reported on 12/11/2023) 16 g 6   sodium chloride  (OCEAN) 0.65 % SOLN nasal spray Place 1 spray into both nostrils as needed for  congestion. (Patient not taking: Reported on 12/11/2023) 30 mL 0   No facility-administered medications prior to visit.    No Known Allergies  ROS Review of Systems  Constitutional:  Negative for appetite change, chills, fatigue and fever.  HENT:  Negative for dental problem, postnasal drip, rhinorrhea and sneezing.   Respiratory:  Positive for cough. Negative for shortness of breath and wheezing.   Cardiovascular:  Negative for chest pain, palpitations and leg swelling.  Gastrointestinal:  Negative for abdominal pain, constipation, nausea and vomiting.  Genitourinary:  Negative for difficulty urinating, dysuria, flank pain and frequency.  Musculoskeletal:  Negative for arthralgias, back pain, joint swelling and myalgias.  Skin:  Negative for color change, pallor, rash and wound.  Neurological:  Negative for dizziness, facial asymmetry, weakness and headaches.  Psychiatric/Behavioral:  Negative for behavioral problems, confusion, self-injury and suicidal ideas.       Objective:    Physical Exam Vitals and nursing note reviewed.  Constitutional:      General: She is not in acute distress.    Appearance: Normal appearance. She is not ill-appearing, toxic-appearing or diaphoretic.  HENT:     Mouth/Throat:     Mouth: Mucous membranes are moist.     Pharynx: Oropharynx is clear. No oropharyngeal exudate or posterior oropharyngeal erythema.  Eyes:     General: No scleral icterus.       Right eye: No discharge.        Left eye: No discharge.     Extraocular Movements: Extraocular movements intact.     Conjunctiva/sclera: Conjunctivae normal.  Cardiovascular:     Rate and Rhythm: Normal rate and regular rhythm.     Pulses: Normal pulses.     Heart sounds: Normal heart sounds. No murmur heard.    No friction rub. No gallop.  Pulmonary:     Effort: Pulmonary effort is normal. No respiratory distress.     Breath sounds: Normal breath sounds. No stridor. No wheezing, rhonchi or  rales.  Chest:     Chest wall: No tenderness.  Abdominal:     General: There is no distension.     Palpations: Abdomen is soft.     Tenderness: There is no abdominal tenderness. There is no right CVA tenderness, left CVA tenderness or guarding.  Musculoskeletal:        General: No tenderness or signs of injury.     Right lower leg: No edema.     Left lower leg: No edema.  Skin:    General: Skin is warm and  dry.     Capillary Refill: Capillary refill takes less than 2 seconds.     Coloration: Skin is not jaundiced or pale.     Findings: No bruising, erythema or lesion.  Neurological:     Mental Status: She is alert and oriented to person, place, and time.     Motor: No weakness.     Coordination: Coordination normal.     Gait: Gait normal.  Psychiatric:        Mood and Affect: Mood normal.        Behavior: Behavior normal.        Thought Content: Thought content normal.        Judgment: Judgment normal.     BP 138/75   Pulse 72   Temp (!) 96.8 F (36 C)   Wt 146 lb (66.2 kg)   LMP  (LMP Unknown)   SpO2 100%   BMI 24.67 kg/m  Wt Readings from Last 3 Encounters:  12/11/23 146 lb (66.2 kg)  11/14/23 150 lb (68 kg)  10/16/23 150 lb 9.6 oz (68.3 kg)    Lab Results  Component Value Date   TSH 1.500 05/01/2017   Lab Results  Component Value Date   WBC 13.9 (H) 04/08/2022   HGB 14.1 04/08/2022   HCT 44.2 04/08/2022   MCV 96.1 04/08/2022   PLT 329 04/08/2022   Lab Results  Component Value Date   NA 143 11/21/2023   K 4.6 11/21/2023   CO2 24 11/21/2023   GLUCOSE 160 (H) 11/21/2023   BUN 15 11/21/2023   CREATININE 0.88 11/21/2023   BILITOT 0.4 06/15/2023   ALKPHOS 87 06/15/2023   AST 13 06/15/2023   ALT 16 06/15/2023   PROT 7.1 06/15/2023   ALBUMIN 4.3 06/15/2023   CALCIUM  10.1 11/21/2023   ANIONGAP 9 04/08/2022   EGFR 78 11/21/2023   Lab Results  Component Value Date   CHOL 126 06/15/2023   Lab Results  Component Value Date   HDL 47 06/15/2023    Lab Results  Component Value Date   LDLCALC 65 06/15/2023   Lab Results  Component Value Date   TRIG 66 06/15/2023   Lab Results  Component Value Date   CHOLHDL 2.7 06/15/2023   Lab Results  Component Value Date   HGBA1C 7.7 (A) 12/11/2023      Assessment & Plan:   Problem List Items Addressed This Visit       Cardiovascular and Mediastinum   Essential hypertension   Systolic blood pressure is elevated today No changes made to medication Continue amlodipine  5 mg daily, valsartan  80 mg daily Has a blood pressure monitor at home but does not monitor her blood pressure Encouraged to monitor blood pressure and report blood pressure readings consistently greater than 130/80 Appreciate collaboration with the clinical pharmacist DASH diet and commitment to daily physical activity for a minimum of 30 minutes discussed and encouraged, as a part of hypertension management.      12/11/2023    9:01 AM 12/11/2023    8:53 AM 11/14/2023    9:04 AM 10/31/2023    5:21 PM 10/16/2023    9:39 AM 10/16/2023    9:22 AM 09/15/2023    7:58 AM  BP/Weight  Systolic BP 138 149 129 135 141 154 131  Diastolic BP 75 75 81 76 71 85 67  Wt. (Lbs)  146 150   150.6 149  BMI  24.67 kg/m2 25.35 kg/m2   25.45 kg/m2 25.18 kg/m2  Assessment: Pulmonary nodule evaluated        Endocrine   Uncontrolled type 2 diabetes mellitus with hyperglycemia, with long-term current use of insulin (HCC) - Primary   Lab Results  Component Value Date   HGBA1C 7.7 (A) 12/11/2023  A1c improved from 8.4 about 3 months ago Stated that she has started taking glipizide  twice daily since her last visit with the clinical pharmacist, stated that prior to that she was taking 5 mg daily Taking metformin  1000 mg twice daily Has a glucometer she does not check her blood sugar Patient encouraged to check blood sugar if she experiences any sign of hypoglycemia, signs of hypoglycemia reviewed Continue current  medications Appreciate collaboration with the clinical pharmacist  Before was taking glipizide  oneca dya , started BID isnec last vist with pharamcy       Relevant Medications   glipiZIDE  (GLUCOTROL ) 5 MG tablet   Other Relevant Orders   POCT glycosylated hemoglobin (Hb A1C) (Completed)   Lipid panel     Nervous and Auditory   Neuropathy   Feels like her neuropathy is now spreading through body Currently on duloxetine  30 mg daily Start duloxetine  60 mg daily      Relevant Medications   DULoxetine  (CYMBALTA ) 30 MG capsule     Other   Dyslipidemia, goal LDL below 70   Checking lipid panel On atorvastatin  20 mg daily Lab Results  Component Value Date   CHOL 126 06/15/2023   HDL 47 06/15/2023   LDLCALC 65 06/15/2023   TRIG 66 06/15/2023   CHOLHDL 2.7 06/15/2023         Anxiety and depression      12/11/2023    8:51 AM 11/14/2023    9:11 AM 09/15/2023    8:01 AM 06/13/2023    3:16 PM  GAD 7 : Generalized Anxiety Score  Nervous, Anxious, on Edge 3 1 0 0  Control/stop worrying 0 3 0 0  Worry too much - different things 0 3 0 0  Trouble relaxing 0 2 0 0  Restless 2 1 0 0  Easily annoyed or irritable 0 3 0 0  Afraid - awful might happen 0 0 0 0  Total GAD 7 Score 5 13 0 0  Anxiety Difficulty Somewhat difficult Somewhat difficult Not difficult at all Not difficult at all        12/11/2023    8:51 AM 11/14/2023    9:08 AM 09/15/2023    8:00 AM  Depression screen PHQ 2/9  Decreased Interest 0 0 0  Down, Depressed, Hopeless 0 0 0  PHQ - 2 Score 0 0 0  Altered sleeping 0 2 0  Tired, decreased energy 0 2 0  Change in appetite 0 0 3  Feeling bad or failure about yourself  0 0 0  Trouble concentrating 0 0 1  Moving slowly or fidgety/restless 0 3 0  Suicidal thoughts 0 0 0  PHQ-9 Score 0 7 4  Difficult doing work/chores Not difficult at all Somewhat difficult Not difficult at all  Doing well on duloxetine  30 mg daily, increase to 60 mg daily for neuropathy        Relevant Medications   DULoxetine  (CYMBALTA ) 30 MG capsule   Acute cough   Patient complains of cough that started 2 days ago, states that she had walked in a food truck and was exposed to smoke over the weekend Initially had sneezing, no runny nose, fever, chills, chest pain, shortness of breath Taking DayQuil  as needed Stated that her grandson also had a cold recently She stated that she did not receive the flu vaccine Flu and COVID test were negative today Continue DayQuil as needed        Relevant Orders   POC COVID-19 BinaxNow (Completed)   POCT Influenza A/B (Completed)    Meds ordered this encounter  Medications   DISCONTD: DULoxetine  (CYMBALTA ) 30 MG capsule    Sig: Take 1 capsule (30 mg total) by mouth daily.    Dispense:  90 capsule    Refill:  1   DULoxetine  (CYMBALTA ) 30 MG capsule    Sig: Take 2 capsules (60 mg total) by mouth daily.    Dispense:  120 capsule    Refill:  1   glipiZIDE  (GLUCOTROL ) 5 MG tablet    Sig: Take 1 tablet (5 mg total) by mouth 2 (two) times daily before a meal.    Dispense:  120 tablet    Refill:  2    DOH, uninsured, ok to dispense 30ds. Dose decrease.    Follow-up: Return in about 14 weeks (around 03/18/2024) for HTN, DM.    Chandria Rookstool R Radley Teston, FNP

## 2023-12-11 NOTE — Assessment & Plan Note (Addendum)
 Systolic blood pressure is elevated today No changes made to medication Continue amlodipine  5 mg daily, valsartan  80 mg daily Has a blood pressure monitor at home but does not monitor her blood pressure Encouraged to monitor blood pressure and report blood pressure readings consistently greater than 130/80 Appreciate collaboration with the clinical pharmacist DASH diet and commitment to daily physical activity for a minimum of 30 minutes discussed and encouraged, as a part of hypertension management.      12/11/2023    9:01 AM 12/11/2023    8:53 AM 11/14/2023    9:04 AM 10/31/2023    5:21 PM 10/16/2023    9:39 AM 10/16/2023    9:22 AM 09/15/2023    7:58 AM  BP/Weight  Systolic BP 138 149 129 135 141 154 131  Diastolic BP 75 75 81 76 71 85 67  Wt. (Lbs)  146 150   150.6 149  BMI  24.67 kg/m2 25.35 kg/m2   25.45 kg/m2 25.18 kg/m2    Assessment: Pulmonary nodule evaluated

## 2023-12-11 NOTE — Assessment & Plan Note (Addendum)
    12/11/2023    8:51 AM 11/14/2023    9:11 AM 09/15/2023    8:01 AM 06/13/2023    3:16 PM  GAD 7 : Generalized Anxiety Score  Nervous, Anxious, on Edge 3 1 0 0  Control/stop worrying 0 3 0 0  Worry too much - different things 0 3 0 0  Trouble relaxing 0 2 0 0  Restless 2 1 0 0  Easily annoyed or irritable 0 3 0 0  Afraid - awful might happen 0 0 0 0  Total GAD 7 Score 5 13 0 0  Anxiety Difficulty Somewhat difficult Somewhat difficult Not difficult at all Not difficult at all        12/11/2023    8:51 AM 11/14/2023    9:08 AM 09/15/2023    8:00 AM  Depression screen PHQ 2/9  Decreased Interest 0 0 0  Down, Depressed, Hopeless 0 0 0  PHQ - 2 Score 0 0 0  Altered sleeping 0 2 0  Tired, decreased energy 0 2 0  Change in appetite 0 0 3  Feeling bad or failure about yourself  0 0 0  Trouble concentrating 0 0 1  Moving slowly or fidgety/restless 0 3 0  Suicidal thoughts 0 0 0  PHQ-9 Score 0 7 4  Difficult doing work/chores Not difficult at all Somewhat difficult Not difficult at all  Doing well on duloxetine  30 mg daily, increase to 60 mg daily for neuropathy

## 2023-12-11 NOTE — Assessment & Plan Note (Signed)
 Checking lipid panel On atorvastatin  20 mg daily Lab Results  Component Value Date   CHOL 126 06/15/2023   HDL 47 06/15/2023   LDLCALC 65 06/15/2023   TRIG 66 06/15/2023   CHOLHDL 2.7 06/15/2023

## 2023-12-11 NOTE — Assessment & Plan Note (Signed)
 Patient complains of cough that started 2 days ago, states that she had walked in a food truck and was exposed to smoke over the weekend Initially had sneezing, no runny nose, fever, chills, chest pain, shortness of breath Taking DayQuil as needed Stated that her grandson also had a cold recently She stated that she did not receive the flu vaccine Flu and COVID test were negative today Continue DayQuil as needed

## 2023-12-12 ENCOUNTER — Ambulatory Visit: Payer: Self-pay | Admitting: Nurse Practitioner

## 2023-12-12 LAB — LIPID PANEL
Chol/HDL Ratio: 2.3 ratio (ref 0.0–4.4)
Cholesterol, Total: 127 mg/dL (ref 100–199)
HDL: 55 mg/dL (ref >39)
LDL Chol Calc (NIH): 61 mg/dL (ref 0–99)
Triglycerides: 47 mg/dL (ref 0–149)
VLDL Cholesterol Cal: 11 mg/dL (ref 5–40)

## 2023-12-18 ENCOUNTER — Other Ambulatory Visit: Payer: Self-pay

## 2024-01-10 ENCOUNTER — Other Ambulatory Visit: Payer: Self-pay

## 2024-01-10 ENCOUNTER — Telehealth: Payer: Self-pay

## 2024-01-10 NOTE — Progress Notes (Deleted)
 01/10/2024 Name: Jessica Garrett MRN: [id number] DOB: December 08, 1970  No chief complaint on file.   Jessica Garrett is a 53 y.o. year old female who presented for a telephone visit.  Patient was identified as falling into the [place] Measure - Diabetes.   Patient was: Referred to pharmacy for chronic disease management.   Subjective: She saw PCP, Lorice Shall, NP on 09/15/23. A1C had increased from 8.1% to 8.4%. She reported taking metformin  1000 mg twice daily. She was instructed to start glipizide  5 mg twice daily. She reported intermittent adherence to atorvastatin  20 mg daily. Her BP was 131/67. She was instructed to decrease amlodipine  to 5 mg daily and start valsartan  80 mg daily for kidney protection. She followed up with Tonya on 10/16/23 and reported only taking amlodipine , metformin , and atorvastatin  due to cost. She was last engaged by pharmacy on 10/22/23 and all her medications were sent to [facility] for affordability. She followed up with her PCP on 11/21/23 and reported taking all her medications as prescribed. Duloxetine  was initiated for mood and neuropathy. She was last engaged by pharmacy via telephone on 12/04/23. At that time she was instructed to continue metformin  and decrease glipizide  to 5 mg once daily due to s/sx c/w hypoglycemia. She saw her PCP on 12/11/23 and A1C had improved from 8.4% to 7.7%. She was instructed to continue current regimen, which included taking glipizide  5 mg BID as this is what the patient reported taking. Her duloxetine  was increased to 60 mg daily for pain.   Today, patient reports ***  Talk about adding trulicity or farxiga (may not qualify for Doreen until she is denied medicaid) - she only started meds ~1 mo prior to repeat A1C, can recheck again before determining next steps.  Care Team: Primary Care Provider: Paseda, Folashade R, FNP ; Next Scheduled Visit: 03/18/24  Medication Access/Adherence  Current Pharmacy:  [facility] [address] [place] [address] Phone: [phone] Fax: [phone]   Patient reports affordability concerns with their medications: Yes - uninsured Patient reports access/transportation concerns to their pharmacy: No  Patient reports adherence concerns with their medications:  Yes  - She uses a pill box. She has been adherent to her medications since picking them up from [facility] on 11/02/23.  Fill history: all maintenance meds last filled on 11/02/23 for 90ds at [facility]  Diabetes:  Current medications: metformin  IR 1000 mg BID, glipizide  IR 5 mg BID with meals (***).   She reports she has been able to get testing supplies, but has not started checking her blood sugar. She asks about a blood sugar watch - we discussed CGM and she reported this would be too expensive for her. Of note, patient may qualify for LIBERATE study in the future if her A1C remains above 8%.   Patient endorses 1-2x episodes of hypoglycemic s/sx including dizziness, shakiness, sweating a couple weeks. Patient endorses polydipsia, nocturia when she forgets her medication.  Denies hyperglycemic symptoms including polyuria,polyphagia, neuropathy, blurred vision.   Current meal patterns: Has added more fruits and vegetables. Cut out beef, pork.  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 [facility]   Hypertension:  Current medications: amlodipine  10 mg daily, valsartan  80 mg daily She does have  a machine at home. She has not been checking.    Hyperlipidemia/ASCVD Risk Reduction  Current lipid lowering medications: atorvastatin  20 mg daily   Risk Factors: aortic atherosclerosis noted on CT in 2023, DM, HTN   Objective:  BP Readings from Last 3 Encounters:  12/11/23 138/75  11/14/23 129/81  10/31/23 135/76    Lab Results  Component Value Date   HGBA1C 7.7 (A) 12/11/2023   HGBA1C 8.4 (A) 09/15/2023   HGBA1C 8.1 (A) 06/13/2023    Lab Results  Component Value Date   CREATININE 0.88 11/21/2023   BUN 15 11/21/2023   NA 143 11/21/2023   K 4.6 11/21/2023   CL 104 11/21/2023   CO2 24 11/21/2023    Lab Results  Component Value Date   CHOL 127 12/11/2023   HDL 55 12/11/2023   LDLCALC 61 12/11/2023   TRIG 47 12/11/2023   CHOLHDL 2.3 12/11/2023    Medications Reviewed Today   Medications were not reviewed in this encounter       Assessment/Plan:   Diabetes: - Currently uncontrolled with last A1c 8.4% on 09/15/23 above goal <7% likely due to non-adherence to medications. Slightly worsened from last A1C of 8.1%. Expect improvement with improved adherence. Patient has experienced 2 episodes consistent with s/sx of hypoglycemia. Will continue glipizide  5 mg once daily with first meal (instead of with her evening meal) to reduce risk of hypoglycemia. She would be a good candidate for SGLT2i or GLP-1RA given elevated UACR/microalbuminuria if next A1C remains > 7%.  - Reviewed long term cardiovascular and renal outcomes of uncontrolled blood sugar - Reviewed goal A1c, goal fasting, and goal 2 hour post prandial glucose - Reviewed dietary modifications including utilizing the healthy plate method, limiting portion size of carbohydrate foods, increasing intake of protein and non-starchy vegetables. Counseled patient to stay hydrated with water throughout the day. - Reviewed lifestyle modifications including: aiming for 150 minutes of moderate intensity exercise every week.   - Recommend to continue metformin  1000 mg BID  - Recommend to reduce glipizide  to 5 mg once daily with the first meal of the day (lunch) - In the future, consider addition of Farxiga 10  mg daily - if she continues to be uninsured, will need to pursue patient assistance.  - Recommend to check fasting blood glucose daily. Advised to bring glucometer to next appointment if she needs assistance with setting it up.  - A1c due August 2025   Hypertension: - Currently controlled, with last clinic BP 129/81 mmHg at goal <130/80. Repeat BMP after starting valsartan  demonstrated stable Scr and K WNL. Appropriate to continue current regimen at this time.  - Reviewed long term cardiovascular and renal outcomes of uncontrolled blood pressure - Recommend to continue amlodipine  10 mg daily and valsartan  80 mg daily   Hyperlipidemia/ASCVD Risk Reduction: - Currently controlled based on last lipid panel with LDL 65 mg/dL, below goal <29 mg/dL given risk factors aortic atherosclerosis, DM, HTN. Emphasized importance of adherence to atorvastatin . Moderate-intensity statin is appropriate. - Reviewed long term complications of uncontrolled cholesterol - Recommend to continue atorvastatin  20 mg daily as prescribed.   Advised patient again to apply for Medicaid at [facility] office.    Follow Up Plan:  PCP 03/18/24, PharmD telephone ***   Lorain Baseman, PharmD [facility] [phone]

## 2024-01-10 NOTE — Telephone Encounter (Signed)
 Attempted to contact patient for scheduled appointment for medication management. Unable to leave VM. Will outreach via [employer]. Confirmed that she has refills left on all her maintenance medications, which will last her until her next PCP appt with Lorice Shall, NP on 03/18/24. Will attempt to reschedule.  Lorain Baseman, PharmD [facility] [phone]

## 2024-02-07 ENCOUNTER — Ambulatory Visit: Payer: Self-pay | Admitting: Nurse Practitioner

## 2024-02-28 ENCOUNTER — Other Ambulatory Visit (INDEPENDENT_AMBULATORY_CARE_PROVIDER_SITE_OTHER): Payer: Self-pay

## 2024-02-28 ENCOUNTER — Other Ambulatory Visit: Payer: Self-pay

## 2024-02-28 DIAGNOSIS — Z794 Long term (current) use of insulin: Secondary | ICD-10-CM

## 2024-02-28 DIAGNOSIS — E1165 Type 2 diabetes mellitus with hyperglycemia: Secondary | ICD-10-CM

## 2024-02-28 NOTE — Progress Notes (Signed)
 02/28/2024 Name: Glorious Gidley MRN: [id number] DOB: 13-Oct-1970  Chief Complaint  Patient presents with   Diabetes   Hypertension   Hyperlipidemia    Yeny Fei is a 53 y.o. year old female who presented for a telephone visit.  Subjective: She saw PCP, Lorice Shall, NP on 09/15/23. A1C had increased from 8.1% to 8.4%. She reported taking metformin  1000 mg twice daily. She was instructed to start glipizide  5 mg twice daily. She reported intermittent adherence to atorvastatin  20 mg daily. Her BP was 131/67. She was instructed to decrease amlodipine  to 5 mg daily and start valsartan  80 mg daily for kidney protection. She followed up with Tonya on 10/16/23 and reported only taking amlodipine , metformin , and atorvastatin  due to cost. She was last engaged by pharmacy on 10/22/23 and all her medications were sent to [facility] for affordability. She followed up with her PCP on 11/21/23 and reported taking all her medications as prescribed. Duloxetine  was initiated for mood and neuropathy. At pharmacy telephone call on 12/04/23, patient confirmed medication affordability. Was having worsened neuropathy. She had some s/sx of hypoglycemia, so was instructed to reduce glipizide  to 5 mg once daily with first meal of the day. She saw PCP on 12/11/23 and A1C improved to 7.7%. Patient reported she was taking glipizide  twice daily.  Today, patient reports doing ok. She reports she has run out of several of her medications. Per fill hx she should be out of everything, but she says she is only completely out of amlodipine , but would like to fill everything if she can. She continues to have some financial barriers. Educated that she can get meds through [facility] free supply and discounted list, and use charge account if needed.   Care Team: Primary Care Provider: Paseda, Folashade R, FNP ; Next Scheduled Visit: 12/11/23  Medication Access/Adherence  Current Pharmacy:  [facility] [address] [place] [address] Phone: [phone] Fax: [phone]   Patient reports affordability concerns with their medications: Yes - uninsured Patient reports access/transportation concerns to their pharmacy: No  Patient reports adherence concerns with their medications:  Yes  - financial constraints lead to nonadherence  Diabetes:  Current medications: metformin  IR 1000 mg BID, glipizide  IR 5 mg BID with meals   She reports she has been able to get testing supplies, but has not started checking her blood sugar. Of note, patient may qualify for LIBERATE study in the future if next A1C is > 8%.   Patient denies hypoglycemic s/sx including dizziness, shakiness, sweating a couple weeks. Patient denies polydipsia, nocturia, polyuria,polyphagia, neuropathy, blurred vision.   Current meal patterns: Has added more fruits and vegetables. Cut out beef, pork.  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 [facility]   Hypertension:  Current medications: amlodipine  5 mg daily, valsartan  80 mg daily  She does have a machine at home. She has not been checking.   Hyperlipidemia/ASCVD Risk Reduction  Current lipid lowering medications: atorvastatin  20 mg daily  Risk Factors: aortic atherosclerosis noted on CT in 2023, DM, HTN   Objective:  BP Readings from Last 3 Encounters:  12/11/23 138/75  11/14/23 129/81  10/31/23 135/76    Lab Results  Component Value Date   HGBA1C 7.7 (A)  12/11/2023   HGBA1C 8.4 (A) 09/15/2023   HGBA1C 8.1 (A) 06/13/2023    Lab Results  Component Value Date   CREATININE 0.88 11/21/2023   BUN 15 11/21/2023   NA 143 11/21/2023   K 4.6 11/21/2023   CL 104 11/21/2023   CO2 24 11/21/2023    Lab Results  Component Value Date   CHOL 127 12/11/2023   HDL 55 12/11/2023   LDLCALC 61 12/11/2023   TRIG 47 12/11/2023   CHOLHDL 2.3 12/11/2023    Medications Reviewed Today     Reviewed by Brinda Lorain SQUIBB, RPH (Pharmacist) on 02/28/24 at 1612  Med List Status: <None>   Medication Order Taking? Sig Documenting Provider Last Dose Status Informant  amLODipine  (NORVASC ) 5 MG tablet [id number]  Take 1 tablet (5 mg total) by mouth daily.  Patient not taking: Reported on 02/28/2024   Paseda, Folashade R, FNP  Active   atorvastatin  (LIPITOR) 20 MG tablet [id number]  Take 1 tablet (20 mg total) by mouth daily. Paseda, Folashade R, FNP  Active   Blood Glucose Monitoring Suppl DEVI [id number]  1 each by Does not apply route in the morning, at noon, and at bedtime. May substitute to any manufacturer covered by patient's insurance.  Patient not taking: Reported on 12/11/2023   Paseda, Folashade R, FNP  Active   DULoxetine  (CYMBALTA ) 30 MG capsule [id number]  Take 2 capsules (60 mg total) by mouth daily. Paseda, Folashade R, FNP  Active   fluticasone  (FLONASE ) 50 MCG/ACT nasal spray [id number]  Place 2 sprays into both nostrils daily.  Patient not taking: Reported on 12/11/2023   Paseda, Folashade R, FNP  Active            Med Note (HERNANDEZ-GARCIA, GEORGINA   Tue Nov 22, 2022  8:55 AM) PRN   glipiZIDE  (GLUCOTROL ) 5 MG tablet [id number]  Take 1 tablet (5 mg total) by mouth 2 (two) times daily before a meal. Paseda, Folashade R, FNP  Active   metFORMIN  (GLUCOPHAGE ) 1000 MG tablet [id number]  Take 1 tablet (1,000 mg total) by mouth 2 (two) times daily with a meal. Paseda, Folashade R, FNP  Active   sodium chloride  (OCEAN) 0.65 % SOLN nasal spray [id number]  Place 1  spray into both nostrils as needed for congestion.  Patient not taking: Reported on 12/11/2023   Paseda, Folashade R, FNP  Active            Med Note (HERNANDEZ-GARCIA, GEORGINA   Tue Nov 22, 2022  8:55 AM) PRN   valsartan  (DIOVAN ) 80 MG tablet [id number]  Take 1 tablet (80 mg total) by mouth daily. Paseda, Folashade R, FNP  Active               Assessment/Plan:   Diabetes: - Currently uncontrolled with last A1c 7.7% above goal <7% likely due to non-adherence to medications. She would be a good candidate for SGLT2i or GLP-1RA given elevated UACR/microalbuminuria if next A1C remains > 7%. Can consider enrollment in LIBERATE study if next A1C is > 8%.  - Reviewed long term cardiovascular and renal outcomes of uncontrolled blood sugar - Reviewed goal A1c, goal fasting, and goal 2 hour post prandial glucose - Reviewed dietary modifications including utilizing the healthy  plate method, limiting portion size of carbohydrate foods, increasing intake of protein and non-starchy vegetables. Counseled patient to stay hydrated with water throughout the day. - Reviewed lifestyle modifications including: aiming for 150 minutes of moderate intensity exercise every week.  - Recommend to continue metformin  1000 mg BID  - Recommend to reduce glipizide  to 5 mg BID - If next A1C is > 7%, will recommend that PCP discuss initiation of Farxiga and I will follow-up on patient assistance forms. - Recommend to check fasting blood glucose daily.  - A1c due now - Requested that [facility] pharmacy fill all maintenance medications on [facility]/discount drug list as able. Advised patient she can use a charge account if needed.    Hypertension: - Currently uncontrolled clinic BP slightly above goal <130/80. Variable control may be related to adherence issues. Advised patient to restart amlodipine . Can titrate valsartan  at PCP f/u if needed.  - Reviewed long term cardiovascular and renal outcomes of uncontrolled blood pressure -  Recommend to continue amlodipine  10 mg daily and valsartan  80 mg daily   Hyperlipidemia/ASCVD Risk Reduction: - Currently controlled based on last lipid panel with LDL 65 mg/dL, below goal <29 mg/dL given risk factors aortic atherosclerosis, DM, HTN. Moderate-intensity statin is appropriate. - Reviewed long term complications of uncontrolled cholesterol - Recommend to continue atorvastatin  20 mg daily as prescribed.   Patient has previously been advised to apply for Medicaid. Did not follow-up on this today.    Follow Up Plan: PCP 03/18/24, PharmD telephone 04/17/24   Lorain Baseman, PharmD [facility] [phone]

## 2024-03-01 ENCOUNTER — Other Ambulatory Visit: Payer: Self-pay

## 2024-03-05 ENCOUNTER — Emergency Department (HOSPITAL_COMMUNITY): Payer: Self-pay

## 2024-03-05 ENCOUNTER — Ambulatory Visit: Payer: Self-pay

## 2024-03-05 ENCOUNTER — Emergency Department (HOSPITAL_COMMUNITY)
Admission: EM | Admit: 2024-03-05 | Discharge: 2024-03-05 | Disposition: A | Discharge status: Home / Self Care | Payer: Self-pay

## 2024-03-05 ENCOUNTER — Other Ambulatory Visit: Payer: Self-pay

## 2024-03-05 ENCOUNTER — Encounter (HOSPITAL_COMMUNITY): Payer: Self-pay

## 2024-03-05 DIAGNOSIS — R002 Palpitations: Secondary | ICD-10-CM | POA: Insufficient documentation

## 2024-03-05 DIAGNOSIS — J40 Bronchitis, not specified as acute or chronic: Secondary | ICD-10-CM | POA: Insufficient documentation

## 2024-03-05 LAB — CBC
HCT: 46.5 % — ABNORMAL HIGH (ref 36.0–46.0)
Hemoglobin: 14.6 g/dL (ref 12.0–15.0)
MCH: 30 pg (ref 26.0–34.0)
MCHC: 31.4 g/dL (ref 30.0–36.0)
MCV: 95.5 fL (ref 80.0–100.0)
Platelets: 376 K/uL (ref 150–400)
RBC: 4.87 MIL/uL (ref 3.87–5.11)
RDW: 13.6 % (ref 11.5–15.5)
WBC: 9.6 K/uL (ref 4.0–10.5)
nRBC: 0 % (ref 0.0–0.2)

## 2024-03-05 LAB — BASIC METABOLIC PANEL WITH GFR
Anion gap: 13 (ref 5–15)
BUN: 17 mg/dL (ref 6–20)
CO2: 25 mmol/L (ref 22–32)
Calcium: 10.5 mg/dL — ABNORMAL HIGH (ref 8.9–10.3)
Chloride: 102 mmol/L (ref 98–111)
Creatinine, Ser: 0.81 mg/dL (ref 0.44–1.00)
GFR, Estimated: >60 mL/min (ref >60)
Glucose, Bld: 162 mg/dL — ABNORMAL HIGH (ref 70–99)
Potassium: 4 mmol/L (ref 3.5–5.1)
Sodium: 140 mmol/L (ref 135–145)

## 2024-03-05 MED ORDER — ALBUTEROL SULFATE HFA 108 (90 BASE) MCG/ACT IN AERS
2.0000 | INHALATION_SPRAY | RESPIRATORY_TRACT | Status: DC
  Filled 2024-03-05: qty 6.7

## 2024-03-05 NOTE — ED Triage Notes (Addendum)
 Patient feels that her heart is racing and she is short of breath for 3 weeks. No chest pain. Has had cough for 2 months with yellow mucus.

## 2024-03-05 NOTE — Telephone Encounter (Signed)
 FYI Only or Action Required?: Action required by provider: request for appointment.: please call pt  Patient was last seen in primary care on 12/11/2023 by Paseda, Folashade R, FNP.  Called Nurse Triage reporting Chest Pain.  Symptoms began x 2 weeks and ongoing.  Interventions attempted: Nothing.  Symptoms are: unchanged.  Triage Disposition: See PCP When Office is Open (Within 3 Days)  Patient/caregiver understands and will follow disposition?: YesCopied from CRM [id number]. Topic: Clinical - Red Word Triage >> Mar 05, 2024  1:51 PM Antony RAMAN wrote: Red Word that prompted transfer to Nurse Triage: chest tightness, rapid heartbeat Reason for Disposition  [1] Chest pain lasting < 5 minutes AND [2] has not taken prescribed nitroglycerin    Attempted to schedule appt with PCP but no openings within 2 weeks  History of hyperthyroidism or taking thyroid medication  Answer Assessment - Initial Assessment Questions 1. LOCATION: Where does it hurt?       Center of chest - smoke is feeling/surrounding  2. RADIATION: Does the pain go anywhere else? (e.g., into neck, jaw, arms, back)     Dental pain related gum disease & loose teeth 3. ONSET: When did the chest pain begin? (Minutes, hours or days)      X 2  weeks 4. PATTERN: Does the pain come and go, or has it been constant since it started?  Does it get worse with exertion?      Comes and goes 5. DURATION: How long does it last (e.g., seconds, minutes, hours)     na 6. SEVERITY: How bad is the pain?  (e.g., Scale 1-10; mild, moderate, or severe)     mild 7. CARDIAC RISK FACTORS: Do you have any history of heart problems or risk factors for heart disease? (e.g., angina, prior heart attack; diabetes, high blood pressure, high cholesterol, smoker, or strong family history of heart disease)     HTN 8. PULMONARY RISK FACTORS: Do you have any history of lung disease?  (e.g., blood clots in lung, asthma, emphysema,  birth control pills)     na 9. CAUSE: What do you think is causing the chest pain?     unknown 10. OTHER SYMPTOMS: Do you have any other symptoms? (e.g., dizziness, nausea, vomiting, sweating, fever, difficulty breathing, cough)       SOB at times, increased HR, diarrhea, nausea, sweating, cough x month -productive at times-clear & yellowish, jittery 11. PREGNANCY: Is there any chance you are pregnant? When was your last menstrual period?       na  Currently not taking any of anxiety medication:  pt stated she does not think or know of anything that is making or has made her anxious however pt is jittery and keeps having these s/s come and go.  Attempted to schedule pt appt but no opening prior to upcoming appt in 2 weeks. Pt would like to know if it is safe to wait 2 weeks of what.  Please call pt and inform of PCP recommendations of plan of care moving forward.  Answer Assessment - Initial Assessment Questions 1. DESCRIPTION: Please describe your heart rate or heartbeat that you are having (e.g., fast/slow, regular/irregular, skipped or extra beats, palpitations)     Increased heart rate and pounding 2. ONSET: When did it start? (e.g., minutes, hours, days)      X 2 weeks 3. DURATION: How long does it last (e.g., seconds, minutes, hours)     unsure 4. PATTERN Does it  come and go, or has it been constant since it started?  Does it get worse with exertion?   Are you feeling it now?     Comes and goes 5. TAP: Using your hand, can you tap out what you are feeling on a chair or table in front of you, so that I can hear? Note: Not all patients can do this.       na 6. HEART RATE: Can you tell me your heart rate? How many beats in 15 secounds?  Note: Not all patients can do this.       na 7. RECURRENT SYMPTOM: Have you ever had this before? If Yes, ask: When was the last time? and What happened that time?     Yes a long times ago 8. CAUSE: What do you think is  causing the palpitations?     unsure 9. CARDIAC HISTORY: Do you have any history of heart disease? (e.g., heart attack, angina, bypass surgery, angioplasty, arrhythmia)      na 10. OTHER SYMPTOMS: Do you have any other symptoms? (e.g., dizziness, chest pain, sweating, difficulty breathing)       Nausea, SOB at times, sweating, jittery, chest cloudiness/smokiness 11. PREGNANCY: Is there any chance you are pregnant? When was your last menstrual period?       na  Protocols used: Chest Pain-A-AH, Heart Rate and Heartbeat Questions-A-AH

## 2024-03-05 NOTE — ED Provider Notes (Signed)
 [place] EMERGENCY DEPARTMENT AT [facility] Provider Note   CSN: [id number] Arrival date & time: 03/05/24  1733     Patient presents with: Palpitations   Jessica Garrett is a 53 y.o. female.   53 year old female presents for evaluation of palpitations.  States she seems to get palpitations and mild shortness of breath when she exerts herself.  States she does not have any chest pain.  She states has been going for a few weeks.  She states for that time she has also had a cough which is productive of yellow sputum.  She denies any cardiac history.  Denies any other symptoms or concerns at this time.   Palpitations Associated symptoms: cough and shortness of breath   Associated symptoms: no back pain, no chest pain and no vomiting        Prior to Admission medications   Medication Sig Start Date End Date Taking? Authorizing Provider  amLODipine  (NORVASC ) 5 MG tablet Take 1 tablet (5 mg total) by mouth daily. Patient not taking: Reported on 02/28/2024 10/23/23   Paseda, Folashade R, FNP  atorvastatin  (LIPITOR) 20 MG tablet Take 1 tablet (20 mg total) by mouth daily. 10/23/23   Paseda, Folashade R, FNP  Blood Glucose Monitoring Suppl DEVI 1 each by Does not apply route in the morning, at noon, and at bedtime. May substitute to any manufacturer covered by patient's insurance. Patient not taking: Reported on 12/11/2023 07/10/23   Paseda, Folashade R, FNP  DULoxetine  (CYMBALTA ) 30 MG capsule Take 2 capsules (60 mg total) by mouth daily. 12/11/23   Paseda, Folashade R, FNP  fluticasone  (FLONASE ) 50 MCG/ACT nasal spray Place 2 sprays into both nostrils daily. Patient not taking: Reported on 12/11/2023 04/18/22   Paseda, Folashade R, FNP  glipiZIDE  (GLUCOTROL ) 5 MG tablet Take 1 tablet (5 mg total) by mouth 2 (two) times daily before a meal. 12/11/23   Paseda, Folashade R, FNP  metFORMIN  (GLUCOPHAGE ) 1000 MG tablet Take 1 tablet (1,000 mg total) by mouth 2 (two) times daily with a meal.  10/23/23   Paseda, Folashade R, FNP  sodium chloride  (OCEAN) 0.65 % SOLN nasal spray Place 1 spray into both nostrils as needed for congestion. Patient not taking: Reported on 12/11/2023 04/18/22   Paseda, Folashade R, FNP  valsartan  (DIOVAN ) 80 MG tablet Take 1 tablet (80 mg total) by mouth daily. 10/23/23   Paseda, Folashade R, FNP    Allergies: Patient has no known allergies.    Review of Systems  Constitutional:  Negative for chills and fever.  HENT:  Negative for ear pain and sore throat.   Eyes:  Negative for pain and visual disturbance.  Respiratory:  Positive for cough and shortness of breath.   Cardiovascular:  Positive for palpitations. Negative for chest pain.  Gastrointestinal:  Negative for abdominal pain and vomiting.  Genitourinary:  Negative for dysuria and hematuria.  Musculoskeletal:  Negative for arthralgias and back pain.  Skin:  Negative for color change and rash.  Neurological:  Negative for seizures and syncope.  All other systems reviewed and are negative.   Updated Vital Signs BP (!) 144/77   Pulse 80   Temp 98.3 F (36.8 C) (Oral)   Resp 19   Ht 5' 4.5 (1.638 m)   Wt 67.1 kg   LMP  (LMP Unknown)   SpO2 100%   BMI 25.01 kg/m   Physical Exam Vitals and nursing note reviewed.  Constitutional:      General: She is not  in acute distress.    Appearance: Normal appearance. She is well-developed. She is not ill-appearing.  HENT:     Head: Normocephalic and atraumatic.  Eyes:     Conjunctiva/sclera: Conjunctivae normal.  Cardiovascular:     Rate and Rhythm: Normal rate and regular rhythm.     Pulses: Normal pulses.     Heart sounds: Normal heart sounds. No murmur heard. Pulmonary:     Effort: Pulmonary effort is normal. No respiratory distress.     Breath sounds: Normal breath sounds.  Abdominal:     Palpations: Abdomen is soft.     Tenderness: There is no abdominal tenderness.  Musculoskeletal:        General: No swelling.     Cervical back: Neck  supple.  Skin:    General: Skin is warm and dry.     Capillary Refill: Capillary refill takes less than 2 seconds.  Neurological:     General: No focal deficit present.     Mental Status: She is alert.  Psychiatric:        Mood and Affect: Mood normal.     (all labs ordered are listed, but only abnormal results are displayed) Labs Reviewed  BASIC METABOLIC PANEL WITH GFR - Abnormal; Notable for the following components:      Result Value   Glucose, Bld 162 (*)    Calcium  10.5 (*)    All other components within normal limits  CBC - Abnormal; Notable for the following components:   HCT 46.5 (*)    All other components within normal limits    EKG: EKG Interpretation Date/Time:  Tuesday March 05 2024 17:43:01 EDT Ventricular Rate:  79 PR Interval:  130 QRS Duration:  72 QT Interval:  346 QTC Calculation: 397 R Axis:   61  Text Interpretation: Sinus rhythm Left ventricular hypertrophy Compared with prior EKG from 04/08/2022 Confirmed by Gennaro Bouchard ([id number]) on 03/05/2024 7:41:57 PM  Radiology: [facility] Chest 2 View Result Date: 03/05/2024 CLINICAL DATA:  Shortness of breath, tachycardia EXAM: CHEST - 2 VIEW COMPARISON:  01/23/2015 FINDINGS: The heart size and mediastinal contours are within normal limits. Both lungs are clear. The visualized skeletal structures are unremarkable. IMPRESSION: No active cardiopulmonary disease. Electronically Signed   By: Ozell Daring M.D.   On: 03/05/2024 18:17     Procedures   Medications Ordered in the ED  albuterol (VENTOLIN HFA) 108 (90 Base) MCG/ACT inhaler 2 puff (has no administration in time range)                                    Medical Decision Making Patient here for palpitations.  She appears well on exam with normal vitals and heart rate within normal limits.  Lab workup and EKG are unremarkable.  I will give her an albuterol inhaler to use as needed for shortness of breath.  Sounds like she has had a cough for about a  month now consistent with probably bronchitis.  Advise close up with primary care to discuss a Holter monitor with them.  Advised return for new or worsening symptoms.  She feels comfortable with plan to be discharged home.  Problems Addressed: Bronchitis: acute illness or injury Palpitations: acute illness or injury  Amount and/or Complexity of Data Reviewed External Data Reviewed: notes.    Details: Previous ED visits reviewed and patient seen 6/17-25 for foot pain Labs: ordered. Decision-making details documented in ED  Course.    Details: Ordered and reviewed by me and unremarkable Radiology: ordered and independent interpretation performed. Decision-making details documented in ED Course.    Details: Ordered and interpreted by me independently radiology Chest x-ray: Shows no acute abnormality ECG/medicine tests: ordered and independent interpretation performed. Decision-making details documented in ED Course.    Details: Ordered and interpreted by me in the absence of cardiology shows sinus rhythm, no STEMI or acute abnormality  Risk OTC drugs. Prescription drug management.     Final diagnoses:  Bronchitis  Palpitations    ED Discharge Orders     None          Gennaro Duwaine CROME, DO 03/05/24 2001

## 2024-03-05 NOTE — Discharge Instructions (Signed)
 Take your inhaler as needed for shortness of breath.  Should call and follow-up with your primary care doctor and discuss with them getting a Holter monitor.  Return to the ER for new or worsening symptoms.

## 2024-03-07 ENCOUNTER — Telehealth: Payer: Self-pay

## 2024-03-07 NOTE — Transitions of Care (Post Inpatient/ED Visit) (Signed)
 03/07/2024  Name: Jessica Garrett MRN: [id number] DOB: 02-16-1971  Today's TOC FU Call Status: Today's TOC FU Call Status:: Successful TOC FU Call Completed TOC FU Call Complete Date: 03/07/24 Patient's Name and Date of Birth confirmed.  Transition Care Management Follow-up Telephone Call Date of Discharge: 03/05/24 Discharge Facility: [facility]) Type of Discharge: Emergency Department Reason for ED Visit: Respiratory, Cardiac Conditions Respiratory Diagnosis:  (Bronchitis) Cardiac Conditions Diagnosis:  (Palpitations) How have you been since you were released from the hospital?: Better Any questions or concerns?: Yes Patient Questions/Concerns:: (S) Still having intermittent palpitations and shortness of breath Patient Questions/Concerns Addressed: Notified Provider of Patient Questions/Concerns  Items Reviewed: Did you receive and understand the discharge instructions provided?: Yes Medications obtained,verified, and reconciled?: Yes (Medications Reviewed) Any new allergies since your discharge?: No Dietary orders reviewed?: NA Do you have support at home?: Yes  Medications Reviewed Today: Medications Reviewed Today     Reviewed by Lavelle Charmaine NOVAK, LPN (Licensed Practical Nurse) on 03/07/24 at 1317  Med List Status: <None>   Medication Order Taking? Sig Documenting Provider Last Dose Status Informant  amLODipine  (NORVASC ) 5 MG tablet [id number]  Take 1 tablet (5 mg total) by mouth daily.  Patient not taking: Reported on 02/28/2024   Paseda, Folashade R, FNP  Active   atorvastatin  (LIPITOR) 20 MG tablet [id number]  Take 1 tablet (20 mg total) by mouth daily. Paseda, Folashade R, FNP  Active   Blood Glucose Monitoring Suppl DEVI [id number]  1 each by Does not apply route in the morning, at noon, and at bedtime. May substitute to any manufacturer covered by patient's insurance.  Patient not taking: Reported on 12/11/2023   Paseda, Folashade R, FNP  Active   DULoxetine   (CYMBALTA ) 30 MG capsule [id number]  Take 2 capsules (60 mg total) by mouth daily. Paseda, Folashade R, FNP  Active   fluticasone  (FLONASE ) 50 MCG/ACT nasal spray [id number]  Place 2 sprays into both nostrils daily.  Patient not taking: Reported on 12/11/2023   Paseda, Folashade R, FNP  Active            Med Note (HERNANDEZ-GARCIA, GEORGINA   Tue Nov 22, 2022  8:55 AM) PRN   glipiZIDE  (GLUCOTROL ) 5 MG tablet [id number]  Take 1 tablet (5 mg total) by mouth 2 (two) times daily before a meal. Paseda, Folashade R, FNP  Active   metFORMIN  (GLUCOPHAGE ) 1000 MG tablet [id number]  Take 1 tablet (1,000 mg total) by mouth 2 (two) times daily with a meal. Paseda, Folashade R, FNP  Active   sodium chloride  (OCEAN) 0.65 % SOLN nasal spray [id number]  Place 1 spray into both nostrils as needed for congestion.  Patient not taking: Reported on 12/11/2023   Paseda, Folashade R, FNP  Active            Med Note (HERNANDEZ-GARCIA, GEORGINA   Tue Nov 22, 2022  8:55 AM) PRN   valsartan  (DIOVAN ) 80 MG tablet [id number]  Take 1 tablet (80 mg total) by mouth daily. Paseda, Folashade R, FNP  Active             Home Care and Equipment/Supplies: Were Home Health Services Ordered?: NA Any new equipment or medical supplies ordered?: NA  Functional Questionnaire: Do you need assistance with bathing/showering or dressing?: No Do you need assistance with meal preparation?: No Do you need assistance with eating?: No Do you have difficulty maintaining continence: No Do you need assistance with getting out of bed/getting out of a chair/moving?:  No Do you have difficulty managing or taking your medications?: No  Follow up appointments reviewed: PCP Follow-up appointment confirmed?: Yes Date of PCP follow-up appointment?: 03/08/24 Follow-up Provider: Folashade Paseda FNP [facility] Follow-up appointment confirmed?: NA Do you need transportation to your follow-up appointment?: No Do you understand care options if  your condition(s) worsen?: Yes-patient verbalized understanding    SIGNATURE Charmaine Bloodgood, LPN [facility] Advisor [place] [facility] You Are. We Are. [facility] Direct Dial [phone]

## 2024-03-08 ENCOUNTER — Other Ambulatory Visit: Payer: Self-pay

## 2024-03-08 ENCOUNTER — Encounter: Payer: Self-pay | Admitting: Nurse Practitioner

## 2024-03-08 ENCOUNTER — Ambulatory Visit: Payer: Self-pay | Attending: Nurse Practitioner

## 2024-03-08 ENCOUNTER — Ambulatory Visit (INDEPENDENT_AMBULATORY_CARE_PROVIDER_SITE_OTHER): Payer: Self-pay | Admitting: Nurse Practitioner

## 2024-03-08 VITALS — BP 144/71 | HR 72 | Wt 148.0 lb

## 2024-03-08 DIAGNOSIS — E785 Hyperlipidemia, unspecified: Secondary | ICD-10-CM

## 2024-03-08 DIAGNOSIS — R002 Palpitations: Secondary | ICD-10-CM

## 2024-03-08 DIAGNOSIS — I517 Cardiomegaly: Secondary | ICD-10-CM

## 2024-03-08 DIAGNOSIS — Z794 Long term (current) use of insulin: Secondary | ICD-10-CM

## 2024-03-08 DIAGNOSIS — E1165 Type 2 diabetes mellitus with hyperglycemia: Secondary | ICD-10-CM

## 2024-03-08 DIAGNOSIS — Z09 Encounter for follow-up examination after completed treatment for conditions other than malignant neoplasm: Secondary | ICD-10-CM

## 2024-03-08 DIAGNOSIS — I10 Essential (primary) hypertension: Secondary | ICD-10-CM

## 2024-03-08 MED ORDER — AMLODIPINE BESYLATE 5 MG PO TABS
7.5000 mg | ORAL_TABLET | Freq: Every day | ORAL | 1 refills | Status: AC
  Filled 2024-03-08 – 2024-06-12 (×2): qty 180, 120d supply, fill #0

## 2024-03-08 MED ORDER — VALSARTAN 80 MG PO TABS
80.0000 mg | ORAL_TABLET | Freq: Every day | ORAL | 1 refills | Status: AC
Start: 2024-03-08 — End: ?
  Filled 2024-03-08: qty 90, 90d supply, fill #0

## 2024-03-08 NOTE — Assessment & Plan Note (Signed)
 BP Readings from Last 3 Encounters:  03/08/24 (!) 144/71  03/05/24 (!) 144/77  12/11/23 138/75   Blood pressure remains elevated despite current medication regimen. Home readings were high. - Increase amlodipine  to 7.5 mg daily by taking one and a half tablets. - Continue valsartan  80 mg daily. - Advise low salt, low fat diet. - Encourage regular physical activity, 30 minutes five days a week.

## 2024-03-08 NOTE — Progress Notes (Signed)
 Established Patient Office Visit  Subjective:  Patient ID: Jessica Garrett, female    DOB: 29-Jan-1971  Age: 53 y.o. MRN: [id number]  CC:  Chief Complaint  Patient presents with   Hospitalization Follow-up    Rapid heart rate and sob going on for weeks     HPI    Discussed the use of AI scribe software for clinical note transcription with the patient, who gave verbal consent to proceed.  History of Present Illness Jessica Garrett is a 53 year old female  has a past medical history of Anemia, Diabetes mellitus without complication (HCC), GERD (gastroesophageal reflux disease), Headache, and Hypertension.   who presents with rapid heartbeat and shortness of breath.  She has been experiencing a rapid heartbeat and shortness of breath for about three weeks, which led to a visit to the emergency room 3 days ago. During that visit, she was prescribed albuterol inhaler to be used as needed for shortness of breath, no chest pain is reported.  EKG showed sinus rhythm left ventricular hypertrophy rate of 79, she was advised to follow-up with primary care to discuss a Holter monitor.  She also reported cough for about a month now which was probably bronchitis  Her family history is significant for heart disease, with her mother, grandfather, and aunt having had heart disease, and her father currently in heart failure with a heart function of twenty-five percent.  She has a history of hypertension and is currently taking amlodipine  5 mg daily and valsartan  80 mg daily. Despite medication, her blood pressure readings at home have been high, with recent measurements of 146/68 and 144/71. She took her blood pressure medication about an hour before the current visit.  She is also managing diabetes with glipizide  5 mg twice a day and metformin  1000 mg twice a day. Her previous blood sugar reading was 7.7 two months ago.  For hyperlipidemia, she is taking atorvastatin  20 mg daily.     Assessment & Plan      Past Medical History:  Diagnosis Date   Anemia    only with pregnancy   Diabetes mellitus without complication (HCC)    GERD (gastroesophageal reflux disease)    Headache     I just go to bed when I have a headache   Hypertension     Past Surgical History:  Procedure Laterality Date   CHOLECYSTECTOMY N/A 04/26/2016   Procedure: LAPAROSCOPIC CHOLECYSTECTOMY;  Surgeon: Lynda Leos, MD;  Location: [facility] OR;  Service: General;  Laterality: N/A;   TUBAL LIGATION  1992   VAGINAL DELIVERY     x3   WISDOM TOOTH EXTRACTION     only on the R side , pt. reports that she was a wake for the procedure     Family History  Problem Relation Age of Onset   Heart disease Mother    Colon cancer Father    Hypertension Father    Heart disease Father    Diabetes Brother    Hypertension Brother    Heart disease Maternal Aunt    Diabetes Maternal Grandmother    Colon polyps Neg Hx    Esophageal cancer Neg Hx    Rectal cancer Neg Hx    Stomach cancer Neg Hx    Breast cancer Neg Hx     Social History   Socioeconomic History   Marital status: Single    Spouse name: Not on file   Number of children: Not on file   Years of education: Not  on file   Highest education level: Not on file  Occupational History   Not on file  Tobacco Use   Smoking status: Never   Smokeless tobacco: Never  Vaping Use   Vaping status: Never Used  Substance and Sexual Activity   Alcohol use: Yes    Comment: occasional   Drug use: No   Sexual activity: Yes    Birth control/protection: None  Other Topics Concern   Not on file  Social History Narrative   Pt stated she is a social drinker   Social Drivers of Corporate investment banker Strain: Not on file  Food Insecurity: No Food Insecurity (10/16/2023)   Hunger Vital Sign    Worried About Running Out of Food in the Last Year: Never true    Ran Out of Food in the Last Year: Never true  Transportation Needs: No Transportation Needs (10/16/2023)    PRAPARE - Administrator, Civil Service (Medical): No    Lack of Transportation (Non-Medical): No  Physical Activity: Not on file  Stress: Not on file  Social Connections: Not on file  Intimate Partner Violence: Not on file    Outpatient Medications Prior to Visit  Medication Sig Dispense Refill   albuterol (VENTOLIN HFA) 108 (90 Base) MCG/ACT inhaler Inhale 2 puffs into the lungs every 6 (six) hours as needed for wheezing or shortness of breath.     atorvastatin  (LIPITOR) 20 MG tablet Take 1 tablet (20 mg total) by mouth daily. 90 tablet 1   DULoxetine  (CYMBALTA ) 30 MG capsule Take 2 capsules (60 mg total) by mouth daily. 120 capsule 1   glipiZIDE  (GLUCOTROL ) 5 MG tablet Take 1 tablet (5 mg total) by mouth 2 (two) times daily before a meal. 120 tablet 2   metFORMIN  (GLUCOPHAGE ) 1000 MG tablet Take 1 tablet (1,000 mg total) by mouth 2 (two) times daily with a meal. 180 tablet 2   amLODipine  (NORVASC ) 5 MG tablet Take 1 tablet (5 mg total) by mouth daily. 90 tablet 1   valsartan  (DIOVAN ) 80 MG tablet Take 1 tablet (80 mg total) by mouth daily. 90 tablet 1   Blood Glucose Monitoring Suppl DEVI 1 each by Does not apply route in the morning, at noon, and at bedtime. May substitute to any manufacturer covered by patient's insurance. (Patient not taking: Reported on 03/08/2024) 1 each 0   fluticasone  (FLONASE ) 50 MCG/ACT nasal spray Place 2 sprays into both nostrils daily. (Patient not taking: Reported on 03/08/2024) 16 g 6   sodium chloride  (OCEAN) 0.65 % SOLN nasal spray Place 1 spray into both nostrils as needed for congestion. (Patient not taking: Reported on 03/08/2024) 30 mL 0   No facility-administered medications prior to visit.    No Known Allergies  ROS Review of Systems  Constitutional:  Negative for appetite change, chills, fatigue and fever.  HENT:  Negative for congestion, postnasal drip, rhinorrhea and sneezing.   Respiratory:  Positive for shortness of breath.  Negative for cough and wheezing.   Cardiovascular:  Positive for palpitations. Negative for chest pain and leg swelling.  Gastrointestinal:  Negative for abdominal pain, constipation, nausea and vomiting.  Genitourinary:  Negative for difficulty urinating, dysuria, flank pain and frequency.  Musculoskeletal:  Negative for arthralgias, back pain, joint swelling and myalgias.  Skin:  Negative for color change, pallor, rash and wound.  Neurological:  Negative for dizziness, facial asymmetry, weakness, numbness and headaches.  Psychiatric/Behavioral:  Negative for behavioral problems, confusion, self-injury and  suicidal ideas.       Objective:    Physical Exam Vitals and nursing note reviewed.  Constitutional:      General: She is not in acute distress.    Appearance: Normal appearance. She is not ill-appearing, toxic-appearing or diaphoretic.  Eyes:     General: No scleral icterus.       Right eye: No discharge.        Left eye: No discharge.     Extraocular Movements: Extraocular movements intact.     Conjunctiva/sclera: Conjunctivae normal.  Cardiovascular:     Rate and Rhythm: Normal rate and regular rhythm.     Pulses: Normal pulses.     Heart sounds: Normal heart sounds. No murmur heard.    No friction rub. No gallop.  Pulmonary:     Effort: Pulmonary effort is normal. No respiratory distress.     Breath sounds: Normal breath sounds. No stridor. No wheezing, rhonchi or rales.  Chest:     Chest wall: No tenderness.  Abdominal:     General: There is no distension.     Palpations: Abdomen is soft.     Tenderness: There is no abdominal tenderness. There is no right CVA tenderness, left CVA tenderness or guarding.  Musculoskeletal:        General: No swelling, tenderness, deformity or signs of injury.     Right lower leg: No edema.     Left lower leg: No edema.  Skin:    General: Skin is warm and dry.     Capillary Refill: Capillary refill takes less than 2 seconds.      Coloration: Skin is not jaundiced or pale.     Findings: No bruising, erythema or lesion.  Neurological:     Mental Status: She is alert and oriented to person, place, and time.     Motor: No weakness.     Coordination: Coordination normal.     Gait: Gait normal.  Psychiatric:        Mood and Affect: Mood normal.        Behavior: Behavior normal.        Thought Content: Thought content normal.        Judgment: Judgment normal.     BP (!) 144/71   Pulse 72   Wt 148 lb (67.1 kg)   LMP  (LMP Unknown)   SpO2 100%   BMI 25.01 kg/m  Wt Readings from Last 3 Encounters:  03/08/24 148 lb (67.1 kg)  03/05/24 148 lb (67.1 kg)  12/11/23 146 lb (66.2 kg)    Lab Results  Component Value Date   TSH 1.500 05/01/2017   Lab Results  Component Value Date   WBC 9.6 03/05/2024   HGB 14.6 03/05/2024   HCT 46.5 (H) 03/05/2024   MCV 95.5 03/05/2024   PLT 376 03/05/2024   Lab Results  Component Value Date   NA 140 03/05/2024   K 4.0 03/05/2024   CO2 25 03/05/2024   GLUCOSE 162 (H) 03/05/2024   BUN 17 03/05/2024   CREATININE 0.81 03/05/2024   BILITOT 0.4 06/15/2023   ALKPHOS 87 06/15/2023   AST 13 06/15/2023   ALT 16 06/15/2023   PROT 7.1 06/15/2023   ALBUMIN 4.3 06/15/2023   CALCIUM  10.5 (H) 03/05/2024   ANIONGAP 13 03/05/2024   EGFR 78 11/21/2023   Lab Results  Component Value Date   CHOL 127 12/11/2023   Lab Results  Component Value Date   HDL 55 12/11/2023  Lab Results  Component Value Date   LDLCALC 61 12/11/2023   Lab Results  Component Value Date   TRIG 47 12/11/2023   Lab Results  Component Value Date   CHOLHDL 2.3 12/11/2023   Lab Results  Component Value Date   HGBA1C 7.7 (A) 12/11/2023      Assessment & Plan:   Problem List Items Addressed This Visit       Cardiovascular and Mediastinum   Essential hypertension   BP Readings from Last 3 Encounters:  03/08/24 (!) 144/71  03/05/24 (!) 144/77  12/11/23 138/75   Blood pressure remains  elevated despite current medication regimen. Home readings were high. - Increase amlodipine  to 7.5 mg daily by taking one and a half tablets. - Continue valsartan  80 mg daily. - Advise low salt, low fat diet. - Encourage regular physical activity, 30 minutes five days a week.       Relevant Medications   amLODipine  (NORVASC ) 5 MG tablet   valsartan  (DIOVAN ) 80 MG tablet   LVH (left ventricular hypertrophy)    Left ventricular hypertrophy noted on EKG, likely related to hypertension. - Manage hypertension to prevent further cardiac complications.       Relevant Medications   amLODipine  (NORVASC ) 5 MG tablet   valsartan  (DIOVAN ) 80 MG tablet   Other Relevant Orders   Ambulatory referral to Cardiology     Endocrine   Uncontrolled type 2 diabetes mellitus with hyperglycemia, with long-term current use of insulin (HCC)   Type 2 diabetes mellitus with hyperglycemia Previous HbA1c was 7.7%. Goal is to reduce HbA1c to less than 7%. - Continue glipizide  5 mg twice daily. - Continue metformin  1000 mg twice daily. - Advise to avoid sugar, sweets, soda, and high carbohydrate foods.       Relevant Medications   valsartan  (DIOVAN ) 80 MG tablet   Other Relevant Orders   Microalbumin / creatinine urine ratio     Other   Dyslipidemia, goal LDL below 70    Cholesterol levels were good at the last check. - Continue atorvastatin  20 mg daily.      Relevant Medications   amLODipine  (NORVASC ) 5 MG tablet   valsartan  (DIOVAN ) 80 MG tablet   Encounter for examination following treatment at hospital   Hospital chart reviewed, including discharge summary Medications reconciled and reviewed with the patient in detail       Palpitations - Primary   Palpitations Reports rapid heartbeat and shortness of breath for three weeks. No chest pain. Normal EKG with sinus rhythm and left ventricular hypertrophy. Etiology under evaluation. - Order heart monitor. - Refer to cardiology for further  evaluation.       Relevant Orders   LONG TERM MONITOR XT (3-14 DAYS)   Ambulatory referral to Cardiology    Meds ordered this encounter  Medications   amLODipine  (NORVASC ) 5 MG tablet    Sig: Take 1.5 tablets (7.5 mg total) by mouth daily.    Dispense:  180 tablet    Refill:  1    DOH, uninsured, ok to dispense 30ds   valsartan  (DIOVAN ) 80 MG tablet    Sig: Take 1 tablet (80 mg total) by mouth daily.    Dispense:  90 tablet    Refill:  1    DOH, uninsured, ok to dispense 30ds    Follow-up: No follow-ups on file.    Kasir Hallenbeck R Olando Willems, FNP

## 2024-03-08 NOTE — Assessment & Plan Note (Signed)
 Hospital chart reviewed, including discharge summary Medications reconciled and reviewed with the patient in detail

## 2024-03-08 NOTE — Progress Notes (Unsigned)
Enrolled patient for a 14 day Zio XT monitor to be mailed to patients home  EP to read

## 2024-03-08 NOTE — Assessment & Plan Note (Signed)
  Cholesterol levels were good at the last check. - Continue atorvastatin  20 mg daily.

## 2024-03-08 NOTE — Assessment & Plan Note (Signed)
  Left ventricular hypertrophy noted on EKG, likely related to hypertension. - Manage hypertension to prevent further cardiac complications.

## 2024-03-08 NOTE — Assessment & Plan Note (Signed)
 Palpitations Reports rapid heartbeat and shortness of breath for three weeks. No chest pain. Normal EKG with sinus rhythm and left ventricular hypertrophy. Etiology under evaluation. - Order heart monitor. - Refer to cardiology for further evaluation.

## 2024-03-08 NOTE — Assessment & Plan Note (Signed)
 Type 2 diabetes mellitus with hyperglycemia Previous HbA1c was 7.7%. Goal is to reduce HbA1c to less than 7%. - Continue glipizide  5 mg twice daily. - Continue metformin  1000 mg twice daily. - Advise to avoid sugar, sweets, soda, and high carbohydrate foods.

## 2024-03-08 NOTE — Patient Instructions (Addendum)
 1. Palpitations (Primary)  - LONG TERM MONITOR XT (3-14 DAYS); Future - Ambulatory referral to Cardiology  2. LVH (left ventricular hypertrophy)  - Ambulatory referral to Cardiology  3. Essential hypertension  - amLODipine  (NORVASC ) 5 MG tablet; Take 1.5 tablets (7.5 mg total) by mouth daily.  Dispense: 180 tablet; Refill: 1 - valsartan  (DIOVAN ) 80 MG tablet; Take 1 tablet (80 mg total) by mouth daily.  Dispense: 90 tablet; Refill: 1   Around 3 times per week, check your blood pressure 2 times per day. once in the morning and once in the evening. The readings should be at least one minute apart. Write down these values and bring them to your next nurse visit/appointment.  When you check your BP, make sure you have been doing something calm/relaxing 5 minutes prior to checking. Both feet should be flat on the floor and you should be sitting. Use your left arm and make sure it is in a relaxed position (on a table), and that the cuff is at the approximate level/height of your heart.  Blood pressure goal is less than 130/80   It is important that you exercise regularly at least 30 minutes 5 times a week as tolerated  Think about what you will eat, plan ahead. Choose  clean, green, fresh or frozen over canned, processed or packaged foods which are more sugary, salty and fatty. 70 to 75% of food eaten should be vegetables and fruit. Three meals at set times with snacks allowed between meals, but they must be fruit or vegetables. Aim to eat over a 12 hour period , example 7 am to 7 pm, and STOP after  your last meal of the day. Drink water,generally about 64 ounces per day, no other drink is as healthy. Fruit juice is best enjoyed in a healthy way, by EATING the fruit.  Thanks for choosing [facility] we consider it a privelige to serve you.

## 2024-03-09 LAB — MICROALBUMIN / CREATININE URINE RATIO
Creatinine, Urine: 174.4 mg/dL
Microalb/Creat Ratio: 21 mg/g{creat} (ref 0–29)
Microalbumin, Urine: 36 ug/mL

## 2024-03-11 ENCOUNTER — Ambulatory Visit: Payer: Self-pay | Admitting: Nurse Practitioner

## 2024-03-18 ENCOUNTER — Encounter: Payer: Self-pay | Admitting: Nurse Practitioner

## 2024-03-18 ENCOUNTER — Other Ambulatory Visit: Payer: Self-pay

## 2024-03-18 ENCOUNTER — Ambulatory Visit (INDEPENDENT_AMBULATORY_CARE_PROVIDER_SITE_OTHER): Payer: Self-pay | Admitting: Nurse Practitioner

## 2024-03-18 VITALS — BP 130/72 | HR 67 | Wt 150.0 lb

## 2024-03-18 DIAGNOSIS — I10 Essential (primary) hypertension: Secondary | ICD-10-CM

## 2024-03-18 DIAGNOSIS — R002 Palpitations: Secondary | ICD-10-CM

## 2024-03-18 DIAGNOSIS — E785 Hyperlipidemia, unspecified: Secondary | ICD-10-CM

## 2024-03-18 DIAGNOSIS — R809 Proteinuria, unspecified: Secondary | ICD-10-CM

## 2024-03-18 DIAGNOSIS — E1165 Type 2 diabetes mellitus with hyperglycemia: Secondary | ICD-10-CM

## 2024-03-18 DIAGNOSIS — Z794 Long term (current) use of insulin: Secondary | ICD-10-CM

## 2024-03-18 LAB — POCT GLYCOSYLATED HEMOGLOBIN (HGB A1C): Hemoglobin A1C: 7.4 % — AB (ref 4.0–5.6)

## 2024-03-18 MED ORDER — ATORVASTATIN CALCIUM 20 MG PO TABS
20.0000 mg | ORAL_TABLET | Freq: Every day | ORAL | 1 refills | Status: AC
  Filled 2024-03-18: qty 90, 90d supply, fill #0

## 2024-03-18 MED ORDER — METFORMIN HCL 1000 MG PO TABS
1000.0000 mg | ORAL_TABLET | Freq: Two times a day (BID) | ORAL | 2 refills | Status: AC
  Filled 2024-03-18: qty 180, 90d supply, fill #0

## 2024-03-18 NOTE — Progress Notes (Signed)
 Established Patient Office Visit  Subjective:  Patient ID: Jessica Garrett, female    DOB: 03/31/1971  Age: 53 y.o. MRN: [id number]  CC: No chief complaint on file.   HPI    Discussed the use of AI scribe software for clinical note transcription with the patient, who gave verbal consent to proceed.  History of Present Illness Jessica Garrett is a 53 year old female  has a past medical history of Anemia, Diabetes mellitus without complication (HCC), GERD (gastroesophageal reflux disease), Headache, and Hypertension.   who presents for medication management and follow-up.  She takes amlodipine  7.5 mg daily and valsartan  80 mg daily for hypertension. She takes metformin  1000 mg twice daily and glipizide  5 mg twice daily for diabetes. Her recent A1c is 7.4%, improved from 7.7% three months ago.  Her diet includes moderate consumption of bread, pasta, and rice, with avoidance of fried foods. She primarily drinks water and occasionally sugar-free ginger ale. She typically has her largest meal at dinner but is attempting to have a larger lunch. She does not usually eat breakfast but takes her medications with food.  She is currently wearing a heart monitor and awaits results. Her cholesterol levels were good three months ago, and her calcium  level was slightly elevated at 10.5 mg/dL during a recent emergency room visit. She does not take calcium  supplements.  No fever, chills, chest pain, nausea, or vomiting. Uncertain of her last Pap smear, but recalls having one in 2018.  Declined Pap smear today but plans to get it done at last visit Assessment & Plan     Past Medical History:  Diagnosis Date   Anemia    only with pregnancy   Diabetes mellitus without complication (HCC)    GERD (gastroesophageal reflux disease)    Headache     I just go to bed when I have a headache   Hypertension     Past Surgical History:  Procedure Laterality Date   CHOLECYSTECTOMY N/A 04/26/2016   Procedure:  LAPAROSCOPIC CHOLECYSTECTOMY;  Surgeon: Lynda Leos, MD;  Location: [facility] OR;  Service: General;  Laterality: N/A;   TUBAL LIGATION  1992   VAGINAL DELIVERY     x3   WISDOM TOOTH EXTRACTION     only on the R side , pt. reports that she was a wake for the procedure     Family History  Problem Relation Age of Onset   Heart disease Mother    Colon cancer Father    Hypertension Father    Heart disease Father    Diabetes Brother    Hypertension Brother    Heart disease Maternal Aunt    Diabetes Maternal Grandmother    Colon polyps Neg Hx    Esophageal cancer Neg Hx    Rectal cancer Neg Hx    Stomach cancer Neg Hx    Breast cancer Neg Hx     Social History   Socioeconomic History   Marital status: Single    Spouse name: Not on file   Number of children: Not on file   Years of education: Not on file   Highest education level: Not on file  Occupational History   Not on file  Tobacco Use   Smoking status: Never   Smokeless tobacco: Never  Vaping Use   Vaping status: Never Used  Substance and Sexual Activity   Alcohol use: Yes    Comment: occasional   Drug use: No   Sexual activity: Yes    Birth  control/protection: None  Other Topics Concern   Not on file  Social History Narrative   Pt stated she is a social drinker   Social Drivers of Corporate Investment Banker Strain: Not on file  Food Insecurity: No Food Insecurity (10/16/2023)   Hunger Vital Sign    Worried About Running Out of Food in the Last Year: Never true    Ran Out of Food in the Last Year: Never true  Transportation Needs: No Transportation Needs (10/16/2023)   PRAPARE - Administrator, Civil Service (Medical): No    Lack of Transportation (Non-Medical): No  Physical Activity: Not on file  Stress: Not on file  Social Connections: Not on file  Intimate Partner Violence: Not on file    Outpatient Medications Prior to Visit  Medication Sig Dispense Refill   albuterol (VENTOLIN HFA) 108 (90  Base) MCG/ACT inhaler Inhale 2 puffs into the lungs every 6 (six) hours as needed for wheezing or shortness of breath.     amLODipine  (NORVASC ) 5 MG tablet Take 1.5 tablets (7.5 mg total) by mouth daily. 180 tablet 1   DULoxetine  (CYMBALTA ) 30 MG capsule Take 2 capsules (60 mg total) by mouth daily. 120 capsule 1   glipiZIDE  (GLUCOTROL ) 5 MG tablet Take 1 tablet (5 mg total) by mouth 2 (two) times daily before a meal. 120 tablet 2   valsartan  (DIOVAN ) 80 MG tablet Take 1 tablet (80 mg total) by mouth daily. 90 tablet 1   atorvastatin  (LIPITOR) 20 MG tablet Take 1 tablet (20 mg total) by mouth daily. 90 tablet 1   metFORMIN  (GLUCOPHAGE ) 1000 MG tablet Take 1 tablet (1,000 mg total) by mouth 2 (two) times daily with a meal. 180 tablet 2   Blood Glucose Monitoring Suppl DEVI 1 each by Does not apply route in the morning, at noon, and at bedtime. May substitute to any manufacturer covered by patient's insurance. (Patient not taking: Reported on 03/18/2024) 1 each 0   fluticasone  (FLONASE ) 50 MCG/ACT nasal spray Place 2 sprays into both nostrils daily. (Patient not taking: Reported on 03/18/2024) 16 g 6   sodium chloride  (OCEAN) 0.65 % SOLN nasal spray Place 1 spray into both nostrils as needed for congestion. (Patient not taking: Reported on 03/18/2024) 30 mL 0   No facility-administered medications prior to visit.    No Known Allergies  ROS Review of Systems  Constitutional:  Negative for appetite change, chills, fatigue and fever.  HENT:  Negative for congestion, postnasal drip, rhinorrhea and sneezing.   Respiratory:  Negative for cough, shortness of breath and wheezing.   Cardiovascular:  Negative for chest pain, palpitations and leg swelling.  Gastrointestinal:  Negative for abdominal pain, constipation, nausea and vomiting.  Genitourinary:  Negative for difficulty urinating, dysuria, flank pain and frequency.  Musculoskeletal:  Negative for arthralgias, back pain, joint swelling and myalgias.   Skin:  Negative for color change, pallor, rash and wound.  Neurological:  Negative for dizziness, facial asymmetry, weakness, numbness and headaches.  Psychiatric/Behavioral:  Negative for behavioral problems, confusion, self-injury and suicidal ideas.       Objective:    Physical Exam Vitals and nursing note reviewed.  Constitutional:      General: She is not in acute distress.    Appearance: Normal appearance. She is not ill-appearing, toxic-appearing or diaphoretic.  Eyes:     General: No scleral icterus.       Right eye: No discharge.  Left eye: No discharge.     Extraocular Movements: Extraocular movements intact.     Conjunctiva/sclera: Conjunctivae normal.  Cardiovascular:     Rate and Rhythm: Normal rate and regular rhythm.     Pulses: Normal pulses.     Heart sounds: Normal heart sounds. No murmur heard.    No friction rub. No gallop.  Pulmonary:     Effort: Pulmonary effort is normal. No respiratory distress.     Breath sounds: Normal breath sounds. No stridor. No wheezing, rhonchi or rales.  Chest:     Chest wall: No tenderness.  Abdominal:     General: There is no distension.     Palpations: Abdomen is soft.     Tenderness: There is no abdominal tenderness. There is no right CVA tenderness, left CVA tenderness or guarding.  Musculoskeletal:        General: No swelling, tenderness, deformity or signs of injury.     Right lower leg: No edema.     Left lower leg: No edema.  Skin:    General: Skin is warm and dry.     Capillary Refill: Capillary refill takes less than 2 seconds.     Coloration: Skin is not jaundiced or pale.     Findings: No bruising, erythema or lesion.  Neurological:     Mental Status: She is alert and oriented to person, place, and time.     Motor: No weakness.     Gait: Gait normal.  Psychiatric:        Mood and Affect: Mood normal.        Behavior: Behavior normal.        Thought Content: Thought content normal.        Judgment:  Judgment normal.     BP 130/72   Pulse 67   Wt 150 lb (68 kg)   LMP  (LMP Unknown)   SpO2 100%   BMI 25.35 kg/m  Wt Readings from Last 3 Encounters:  03/18/24 150 lb (68 kg)  03/08/24 148 lb (67.1 kg)  03/05/24 148 lb (67.1 kg)    Lab Results  Component Value Date   TSH 1.500 05/01/2017   Lab Results  Component Value Date   WBC 9.6 03/05/2024   HGB 14.6 03/05/2024   HCT 46.5 (H) 03/05/2024   MCV 95.5 03/05/2024   PLT 376 03/05/2024   Lab Results  Component Value Date   NA 140 03/05/2024   K 4.0 03/05/2024   CO2 25 03/05/2024   GLUCOSE 162 (H) 03/05/2024   BUN 17 03/05/2024   CREATININE 0.81 03/05/2024   BILITOT 0.4 06/15/2023   ALKPHOS 87 06/15/2023   AST 13 06/15/2023   ALT 16 06/15/2023   PROT 7.1 06/15/2023   ALBUMIN 4.3 06/15/2023   CALCIUM  10.5 (H) 03/05/2024   ANIONGAP 13 03/05/2024   EGFR 78 11/21/2023   Lab Results  Component Value Date   CHOL 127 12/11/2023   Lab Results  Component Value Date   HDL 55 12/11/2023   Lab Results  Component Value Date   LDLCALC 61 12/11/2023   Lab Results  Component Value Date   TRIG 47 12/11/2023   Lab Results  Component Value Date   CHOLHDL 2.3 12/11/2023   Lab Results  Component Value Date   HGBA1C 7.4 (A) 03/18/2024      Assessment & Plan:   Problem List Items Addressed This Visit       Cardiovascular and Mediastinum   Essential hypertension  BP Readings from Last 3 Encounters:  03/18/24 130/72  03/08/24 (!) 144/71  03/05/24 (!) 144/77   - Continue amlodipine  7.5 mg daily. - Continue valsartan  80 mg daily. - Encourage heart-healthy, low-salt, low-fat diet. - Encourage 30 minutes of moderate exercise 5 days a week.       Relevant Medications   atorvastatin  (LIPITOR) 20 MG tablet     Endocrine   Uncontrolled type 2 diabetes mellitus with hyperglycemia, with long-term current use of insulin (HCC) - Primary   Lab Results  Component Value Date   HGBA1C 7.4 (A) 03/18/2024   A1c  improved to 7.4, target A1c <7 to reduce cardiovascular and renal risks. No hypoglycemia. Consider Farxiga for additional kidney and heart protection if A1c >7. - Continue metformin  1000 mg twice daily with meals. Continue glipizide  5 mg twice daily,  - Initiate Farxiga as A1c remains above 7, pending assistance with medication access.- Follow up with clinical pharmacist regarding Farxiga initiation. - Continue low carbohydrate diet.      Relevant Medications   atorvastatin  (LIPITOR) 20 MG tablet   metFORMIN  (GLUCOPHAGE ) 1000 MG tablet   Other Relevant Orders   POCT glycosylated hemoglobin (Hb A1C) (Completed)     Other   Dyslipidemia, goal LDL below 70   Lab Results  Component Value Date   CHOL 127 12/11/2023   HDL 55 12/11/2023   LDLCALC 61 12/11/2023   TRIG 47 12/11/2023   CHOLHDL 2.3 12/11/2023  Controlled on atorvastatin  20 mg daily Continue current medication      Relevant Medications   atorvastatin  (LIPITOR) 20 MG tablet   Microalbuminuria   Palpitations    - Continue wearing the heart monitor and return it after 14 days as instructed.        Hypercalcemia   Lab Results  Component Value Date   CALCIUM  10.5 (H) 03/05/2024  Rechecking labs, avoid calcium  supplement drink at least 64 ounces of water daily to maintain hydration      Relevant Orders   Calcium     Meds ordered this encounter  Medications   atorvastatin  (LIPITOR) 20 MG tablet    Sig: Take 1 tablet (20 mg total) by mouth daily.    Dispense:  90 tablet    Refill:  1    DOH, uninsured, ok to dispense 30ds   metFORMIN  (GLUCOPHAGE ) 1000 MG tablet    Sig: Take 1 tablet (1,000 mg total) by mouth 2 (two) times daily with a meal.    Dispense:  180 tablet    Refill:  2    DOH, uninsured, ok to dispense 30ds    Follow-up: Return in about 3 months (around 06/18/2024) for HTN, DM.    Telecia Larocque R Acadia Thammavong, FNP

## 2024-03-18 NOTE — Assessment & Plan Note (Signed)
 Lab Results  Component Value Date   CALCIUM  10.5 (H) 03/05/2024  Rechecking labs, avoid calcium  supplement drink at least 64 ounces of water daily to maintain hydration

## 2024-03-18 NOTE — Assessment & Plan Note (Signed)
-   Continue wearing the heart monitor and return it after 14 days as instructed.

## 2024-03-18 NOTE — Assessment & Plan Note (Addendum)
 BP Readings from Last 3 Encounters:  03/18/24 130/72  03/08/24 (!) 144/71  03/05/24 (!) 144/77   - Continue amlodipine  7.5 mg daily. - Continue valsartan  80 mg daily. - Encourage heart-healthy, low-salt, low-fat diet. - Encourage 30 minutes of moderate exercise 5 days a week.

## 2024-03-18 NOTE — Assessment & Plan Note (Signed)
 Lab Results  Component Value Date   HGBA1C 7.4 (A) 03/18/2024   A1c improved to 7.4, target A1c <7 to reduce cardiovascular and renal risks. No hypoglycemia. Consider Farxiga for additional kidney and heart protection if A1c >7. - Continue metformin  1000 mg twice daily with meals. Continue glipizide  5 mg twice daily,  - Initiate Farxiga as A1c remains above 7, pending assistance with medication access.- Follow up with clinical pharmacist regarding Farxiga initiation. - Continue low carbohydrate diet.

## 2024-03-18 NOTE — Patient Instructions (Addendum)
 Goal for fasting blood sugar ranges from 80 to 120 and 2 hours after any meal or at bedtime should be between 130 to 170.    It is important that you exercise regularly at least 30 minutes 5 times a week as tolerated  Think about what you will eat, plan ahead. Choose  clean, green, fresh or frozen over canned, processed or packaged foods which are more sugary, salty and fatty. 70 to 75% of food eaten should be vegetables and fruit. Three meals at set times with snacks allowed between meals, but they must be fruit or vegetables. Aim to eat over a 12 hour period , example 7 am to 7 pm, and STOP after  your last meal of the day. Drink water,generally about 64 ounces per day, no other drink is as healthy. Fruit juice is best enjoyed in a healthy way, by EATING the fruit.  Thanks for choosing [facility] we consider it a privelige to serve you.

## 2024-03-18 NOTE — Assessment & Plan Note (Signed)
 Lab Results  Component Value Date   CHOL 127 12/11/2023   HDL 55 12/11/2023   LDLCALC 61 12/11/2023   TRIG 47 12/11/2023   CHOLHDL 2.3 12/11/2023  Controlled on atorvastatin  20 mg daily Continue current medication

## 2024-03-19 ENCOUNTER — Ambulatory Visit: Payer: Self-pay | Admitting: Nurse Practitioner

## 2024-03-19 LAB — CALCIUM: Calcium: 9.9 mg/dL (ref 8.7–10.2)

## 2024-04-15 ENCOUNTER — Encounter: Payer: Self-pay | Admitting: Cardiology

## 2024-04-15 ENCOUNTER — Other Ambulatory Visit (HOSPITAL_COMMUNITY): Payer: Self-pay

## 2024-04-15 ENCOUNTER — Ambulatory Visit: Payer: Self-pay | Attending: Cardiology | Admitting: Cardiology

## 2024-04-15 VITALS — BP 126/66 | HR 66 | Ht 64.0 in | Wt 154.0 lb

## 2024-04-15 DIAGNOSIS — I471 Supraventricular tachycardia, unspecified: Secondary | ICD-10-CM | POA: Insufficient documentation

## 2024-04-15 DIAGNOSIS — R0609 Other forms of dyspnea: Secondary | ICD-10-CM | POA: Insufficient documentation

## 2024-04-15 DIAGNOSIS — R002 Palpitations: Secondary | ICD-10-CM

## 2024-04-15 DIAGNOSIS — I10 Essential (primary) hypertension: Secondary | ICD-10-CM

## 2024-04-15 MED ORDER — METOPROLOL TARTRATE 50 MG PO TABS
50.0000 mg | ORAL_TABLET | Freq: Once | ORAL | 0 refills | Status: AC
  Filled 2024-04-15: qty 1, 1d supply, fill #0

## 2024-04-15 MED ORDER — DILTIAZEM HCL 30 MG PO TABS
30.0000 mg | ORAL_TABLET | Freq: Three times a day (TID) | ORAL | 3 refills | Status: AC | PRN
  Filled 2024-04-15: qty 90, 30d supply, fill #0

## 2024-04-15 NOTE — Patient Instructions (Addendum)
 Medication Instructions:  START AS NEEDED Diltiazem 30 mg three times a day as needed   DAY OF CT: metoprolol tartrate (LOPRESSOR) 50 MG tablet         Take 1 tablet (50 mg total) by mouth once for 1 dose. Take 90-120 minutes prior to scan. Hold for SBP less than 110    *If you need a refill on your cardiac medications before your next appointment, please call your pharmacy*  Lab Work: Bmp  If you have labs (blood work) drawn today and your tests are completely normal, you will receive your results only by: MyChart Message (if you have MyChart) OR A paper copy in the mail If you have any lab test that is abnormal or we need to change your treatment, we will call you to review the results.  Testing/Procedures: Echocardiogram  Your physician has requested that you have an echocardiogram. Echocardiography is a painless test that uses sound waves to create images of your heart. It provides your doctor with information about the size and shape of your heart and how well your heart's chambers and valves are working. This procedure takes approximately one hour. There are no restrictions for this procedure. Please do NOT wear cologne, perfume, aftershave, or lotions (deodorant is allowed). Please arrive 15 minutes prior to your appointment time.  Please note: We ask at that you not bring children with you during ultrasound (echo/ vascular) testing. Due to room size and safety concerns, children are not allowed in the ultrasound rooms during exams. Our front office staff cannot provide observation of children in our lobby area while testing is being conducted. An adult accompanying a patient to their appointment will only be allowed in the ultrasound room at the discretion of the ultrasound technician under special circumstances. We apologize for any inconvenience.  Coronary CTA     Your cardiac CT will be scheduled at one of the below locations:   [facility] [address]  [place], [place] [address]   If scheduled at the [facility] at [address], please enter the parking lot using the [address] entrance and use the FREE valet service at the patient drop-off area. Enter the building and check-in with registration on the main floor.  Please follow these instructions carefully (unless otherwise directed):  An IV will be required for this test and Nitroglycerin will be given.  Hold all erectile dysfunction medications at least 3 days (72 hrs) prior to test. (Ie viagra, cialis, sildenafil, tadalafil, etc)   On the Night Before the Test: Be sure to Drink plenty of water. Do not consume any caffeinated/decaffeinated beverages or chocolate 12 hours prior to your test. Do not take any antihistamines 12 hours prior to your test.  On the Day of the Test: Drink plenty of water until 1 hour prior to the test. Do not eat any food 1 hour prior to test. You may take your regular medications prior to the test.  Take metoprolol (Lopressor) two hours prior to test. If you take Furosemide/Hydrochlorothiazide/Spironolactone/Chlorthalidone, please HOLD on the morning of the test. Patients who wear a continuous glucose monitor MUST remove the device prior to scanning. FEMALES- please wear underwire-free bra if available, avoid dresses & tight clothing      After the Test: Drink plenty of water. After receiving IV contrast, you may experience a mild flushed feeling. This is normal. On occasion, you may experience a mild rash up to 24 hours after  the test. This is not dangerous. If this occurs, you can take Benadryl  25 mg, Zyrtec , Claritin, or Allegra and increase your fluid intake. (Patients taking Tikosyn should avoid Benadryl , and may take Zyrtec , Claritin, or Allegra) If you experience trouble breathing, this can be serious. If it is severe call 911 IMMEDIATELY. If it is mild, please call our office.  We will call to schedule  your test 2-4 weeks out understanding that some insurance companies will need an authorization prior to the service being performed.   For more information and frequently asked questions, please visit our website : [email or web address]  For non-scheduling related questions, please contact the cardiac imaging nurse navigator should you have any questions/concerns: Cardiac Imaging Nurse Navigators Direct Office Dial: [phone]   For scheduling needs, including cancellations and rescheduling, please call Brittany, [phone].   Follow-Up: At [facility], you and your health needs are our priority.  As part of our continuing mission to provide you with exceptional heart care, our providers are all part of one team.  This team includes your primary Cardiologist (physician) and Advanced Practice Providers or APPs (Physician Assistants and Nurse Practitioners) who all work together to provide you with the care you need, when you need it.  Your next appointment:   3 month(s)  Provider:   One of our Advanced Practice Providers (APPs): Morse Clause, PA-C  Lamarr Satterfield, NP Miriam Shams, NP  Olivia Pavy, PA-C Josefa Beauvais, NP  Leontine Salen, PA-C Orren Fabry, PA-C  Halma, PA-C Ernest Dick, NP  Damien Braver, NP Jon Hails, PA-C  Waddell Donath, PA-C    Dayna Dunn, PA-C  Scott Weaver, PA-C Lum Louis, NP Katlyn West, NP Callie Goodrich, PA-C  Xika Zhao, NP Sheng Haley, PA-C    Kathleen Johnson, PA-C

## 2024-04-15 NOTE — Progress Notes (Signed)
 Cardiology Office Note:  .   Date:  04/15/2024  ID:  Jessica Garrett, DOB 1970/10/22, MRN [id number] PCP: Paseda, Folashade R, FNP  [facility] Providers Cardiologist:  Jessica Lawrence, MD PCP: Paseda, Folashade R, FNP  Chief Complaint  Patient presents with   Palpitations   Shortness of Breath     Jessica Garrett is a 53 y.o. female with hypertension, type II diabetes mellitus, palpitations, dyspnea on exertion  Discussed the use of AI scribe software for clinical note transcription with the patient, who gave verbal consent to proceed.  History of Present Illness Jessica Garrett is a 53 year old female with hypertension and diabetes who presents with episodes of rapid heartbeat and shortness of breath.  She describes frequent episodes of rapid heartbeat and shortness of breath with minimal exertion and at rest, with some recent improvement. The most recent episode was last week with milder tachycardia than prior episodes.  She has hypertension and diabetes treated with metformin , glipizide , valsartan , and amlodipine , and takes atorvastatin  for cholesterol.  Her mother and maternal grandfather died from heart disease. Her father has heart problems related to cancer treatment.  She runs a food truck, is moderately active, drinks alcohol socially, and does not smoke.      Vitals:   04/15/24 0855  BP: 126/66  Pulse: 66  SpO2: 96%      Review of Systems  Cardiovascular:  Positive for dyspnea on exertion and palpitations. Negative for chest pain, leg swelling and syncope.        Studies Reviewed: SABRA        EKG 04/15/2024: Normal sinus rhythm Normal ECG When compared with ECG of 05-Mar-2024 17:43, PREVIOUS ECG IS PRESENT     Zio patch monitor 12 days 03/14/2024 - 03/26/2024: Dominant rhythm: Sinus. HR 55-148 bpm. Avg HR 83 bpm. 12 episodes of SVT/atrial tachycardia, fastest and longest at 197 bpm for 7.6 secs. <1% isolated SVE, couplet/triplets. 0 episodes  of VT. <% isolated VE, no couplet/triplets. No atrial fibrillation/atrial flutter/VT/high grade AV block, sinus pause >3sec noted. 0 patient triggered events.   Labs 7-02/2024: Chol 127, TG 47, HDL 55, LDL 61 HbA1C 7.4% Hb 14.6 Cr 0.81     Physical Exam Vitals and nursing note reviewed.  Constitutional:      General: She is not in acute distress. Neck:     Vascular: No JVD.  Cardiovascular:     Rate and Rhythm: Normal rate and regular rhythm.     Heart sounds: Normal heart sounds. No murmur heard. Pulmonary:     Effort: Pulmonary effort is normal.     Breath sounds: Normal breath sounds. No wheezing or rales.  Musculoskeletal:     Right lower leg: No edema.     Left lower leg: No edema.      VISIT DIAGNOSES:   ICD-10-CM   1. Palpitations  R00.2 EKG 12-Lead    2. PSVT (paroxysmal supraventricular tachycardia)  I47.10     3. DOE (dyspnea on exertion)  R06.09 CT CORONARY MORPH W/CTA COR W/SCORE W/CA W/CM &/OR WO/CM    metoprolol tartrate (LOPRESSOR) 50 MG tablet    ECHOCARDIOGRAM COMPLETE    4. Essential hypertension  I10 Basic metabolic panel with GFR       Jessica Garrett is a 53 y.o. female with hypertension, type II diabetes mellitus, palpitations, dyspnea on exertion Assessment & Plan Supraventricular tachycardia: Episodes of rapid heartbeat likely due to supraventricular or atrial tachycardia, self-limiting and not dangerous. - Educated on vagal  maneuvers. - Prescribed diltiazem as needed. - Advised reduction of caffeine , alcohol, and stress.  Dyspnea on exertion: -Present with minimal activity.  Risk factors for CAD include hypertension, type 2 diabetes mellitus.  Diabetic pain and could have atypical presentation of CAD such as dyspnea on exertion. - Recommend BMP followed by coronary CT angiogram. - Recommend echocardiogram.    Type 2 diabetes mellitus: - Managed with metformin  and glipizide . - Goal A1C less than 7% - Lipids well-controlled with  atorvastatin .  Primary hypertension: - Controlled with valsartan  and amlodipine .    Meds ordered this encounter  Medications   metoprolol tartrate (LOPRESSOR) 50 MG tablet    Sig: Take 1 tablet (50 mg total) by mouth once for 1 dose. Take 90-120 minutes prior to scan. Hold for SBP less than 110.    Dispense:  1 tablet    Refill:  0   diltiazem (CARDIZEM) 30 MG tablet    Sig: Take 1 tablet (30 mg total) by mouth 3 (three) times daily as needed.    Dispense:  90 tablet    Refill:  3     F/u in 3 months  Signed, Jessica JINNY Lawrence, MD

## 2024-04-15 NOTE — Addendum Note (Signed)
 Addended by: MANDA BOTTCHER B on: 04/15/2024 09:49 AM   Modules accepted: Orders

## 2024-04-17 ENCOUNTER — Ambulatory Visit: Payer: Self-pay

## 2024-04-23 ENCOUNTER — Ambulatory Visit: Payer: Self-pay

## 2024-04-23 ENCOUNTER — Other Ambulatory Visit: Payer: Self-pay

## 2024-04-23 DIAGNOSIS — Z794 Long term (current) use of insulin: Secondary | ICD-10-CM

## 2024-04-23 DIAGNOSIS — E1165 Type 2 diabetes mellitus with hyperglycemia: Secondary | ICD-10-CM

## 2024-04-23 MED ORDER — DAPAGLIFLOZIN PROPANEDIOL 10 MG PO TABS: 10.0000 mg | ORAL_TABLET | Freq: Every day | ORAL | 3 refills | Status: DC

## 2024-04-23 NOTE — Progress Notes (Signed)
 04/23/2024 Name: Jessica Garrett MRN: [id number] DOB: [birth year]  Chief Complaint  Patient presents with   Diabetes    Jessica Garrett is a 53 y.o. year old female who presented for a telephone visit.  Subjective: She saw PCP, Lorice Shall, NP on 09/15/23. A1C had increased from 8.1% to 8.4%. She reported taking metformin  1000 mg twice daily. She was instructed to start glipizide  5 mg twice daily. She reported intermittent adherence to atorvastatin  20 mg daily. Her BP was 131/67. She was instructed to decrease amlodipine  to 5 mg daily and start valsartan  80 mg daily for kidney protection. She followed up with Tonya on 10/16/23 and reported only taking amlodipine , metformin , and atorvastatin  due to cost. She was last engaged by pharmacy on 10/22/23 and all her medications were sent to [facility] for affordability. She followed up with her PCP on 11/21/23 and reported taking all her medications as prescribed. Duloxetine  was initiated for mood and neuropathy. At pharmacy telephone call on 12/04/23, patient confirmed medication affordability. Was having worsened neuropathy. She had some s/sx of hypoglycemia, so was instructed to reduce glipizide  to 5 mg once daily with first meal of the day. She saw PCP on 12/11/23 and A1C improved to 7.7%. Patient reported she was taking glipizide  twice daily. At PCP visit on 03/18/24, A1C had improved to 7.4%. She saw cardiology for palpitations on 04/15/24. BP was at goal. She was initiated on diltiazem  PRN for heart racing. Scheduled cardiac CT for 05/06/24.  Today, patient reports doing ok. She has current supplies of all her medications per her report. However, valsartan  appears it may be running low based on fill hx. She denies needing refill at this time. Has not had issues picking up medications from [facility] pharmacy recently. Recalls discussing addition of SGLT2i with PCP at last visit and is agreeable to plan.   Care Team: Primary Care Provider: Paseda, Folashade R, FNP ; Next Scheduled  Visit: 06/18/24  Medication Access/Adherence  Current Pharmacy:  [facility] [address], [address] [place] [address] Phone: [phone] Fax: [phone]  [place] [facility] [address] [place] [address] Phone: [phone] Fax: [phone]   Patient reports affordability concerns with their medications: Yes - uninsured, using DOH at [facility] pharmacy Patient reports access/transportation concerns to their pharmacy: No  Patient reports adherence concerns with their medications:  Yes  - financial constraints lead to nonadherence  Diabetes:  Current medications: metformin  IR 1000 mg BID, glipizide  IR 5 mg BID with meals   Not checking blood sugars regularly  Patient endorses hypoglycemic s/sx including dizziness, shakiness, sweating last week when she had gone a little while. Patient denies polydipsia, nocturia, polyuria,polyphagia, neuropathy, blurred vision.   Current meal patterns: Has added more fruits and vegetables. Cut out beef, pork.  ~1-2 meals per day ([employer]) - Lunch: sandwich, fruit, leftovers from night before - Supper: chicken, beef, pork, trying to cut back on pasta- has swapped out for wheat pasta, limiting fried foods ~1-2x per week, vegetables (cabbage, collard greens) - Snacks: berries, apples oranges, crackers and cheese, pickles, nuts, tries to avoid cookies - Drinks: water mainly, ginger ale (occasionally), alcohol once or twice a week (1-2 drinks at a time)  Current physical activity: Light walking. [facility]  Hypertension:  Current medications: amlodipine  5 mg daily, valsartan  80 mg daily  She does have a machine at home. She has  not been checking.   Hyperlipidemia/ASCVD Risk Reduction  Current lipid lowering medications: atorvastatin  20 mg daily   Risk Factors: aortic atherosclerosis noted  on CT in 2023, DM, HTN   Objective:  BP Readings from Last 3 Encounters:  04/15/24 126/66  03/18/24 130/72  03/08/24 (!) 144/71    Lab Results  Component Value Date   HGBA1C 7.4 (A) 03/18/2024   HGBA1C 7.7 (A) 12/11/2023   HGBA1C 8.4 (A) 09/15/2023    Lab Results  Component Value Date   CREATININE 0.81 03/05/2024   BUN 17 03/05/2024   NA 140 03/05/2024   K 4.0 03/05/2024   CL 102 03/05/2024   CO2 25 03/05/2024    Lab Results  Component Value Date   CHOL 127 12/11/2023   HDL 55 12/11/2023   LDLCALC 61 12/11/2023   TRIG 47 12/11/2023   CHOLHDL 2.3 12/11/2023    Medications Reviewed Today     Reviewed by Brinda Lorain SQUIBB, RPH-CPP (Pharmacist) on 04/23/24 at 0904  Med List Status: <None>   Medication Order Taking? Sig Documenting Provider Last Dose Status Informant  albuterol  (VENTOLIN  HFA) 108 (90 Base) MCG/ACT inhaler [id number]  Inhale 2 puffs into the lungs every 6 (six) hours as needed for wheezing or shortness of breath. [provider]  Active   amLODipine  (NORVASC ) 5 MG tablet [id number] Yes Take 1.5 tablets (7.5 mg total) by mouth daily. Paseda, Folashade R, FNP  Active   atorvastatin  (LIPITOR) 20 MG tablet [id number] Yes Take 1 tablet (20 mg total) by mouth daily. Paseda, Folashade R, FNP  Active   Blood Glucose Monitoring Suppl DEVI [id number]  1 each by Does not apply route in the morning, at noon, and at bedtime. May substitute to any manufacturer covered by patient's insurance. Paseda, Folashade R, FNP  Active   diltiazem  (CARDIZEM ) 30 MG tablet [id number] Yes Take 1 tablet (30 mg total) by mouth 3 (three) times daily as needed. Patwardhan, Newman PARAS, MD  Active   DULoxetine  (CYMBALTA ) 30 MG capsule [id number] Yes Take 2 capsules (60 mg total) by mouth daily.  Patient taking differently: Take 2 capsules (60 mg total) by mouth daily.   Paseda, Folashade R, FNP  Active   fluticasone  (FLONASE ) 50 MCG/ACT nasal spray [id number]  Place 2 sprays into both nostrils  daily. Paseda, Folashade R, FNP  Active            Med Note (HERNANDEZ-GARCIA, GEORGINA   Tue Nov 22, 2022  8:55 AM) PRN   glipiZIDE  (GLUCOTROL ) 5 MG tablet [id number] Yes Take 1 tablet (5 mg total) by mouth 2 (two) times daily before a meal. Paseda, Folashade R, FNP  Active   metFORMIN  (GLUCOPHAGE ) 1000 MG tablet [id number] Yes Take 1 tablet (1,000 mg total) by mouth 2 (two) times daily with a meal. Paseda, Folashade R, FNP  Active   metoprolol  tartrate (LOPRESSOR ) 50 MG tablet [id number]  Take 1 tablet (50 mg total) by mouth once for 1 dose. Take 90-120 minutes prior to scan. Hold for SBP less than 110. [place] Newman PARAS, MD  Expired 04/16/24 2359   sodium chloride  (OCEAN) 0.65 % SOLN nasal spray [id number]  Place 1 spray into both nostrils as needed for congestion.  Patient not taking: Reported on 04/15/2024   Paseda, Folashade R, FNP  Active            Med Note (HERNANDEZ-GARCIA, GEORGINA   Tue Nov 22, 2022  8:55 AM) PRN   valsartan  (DIOVAN ) 80 MG tablet [id number] Yes  Take 1 tablet (80 mg total) by mouth daily. Paseda, Folashade R, FNP  Active               Assessment/Plan:   Diabetes: - Currently uncontrolled with last A1c 7.4% above goal <7%, but improved with improved adherence. She is a good candidate for addition of SGLT2i given elevated UACR in the past. Agreeable to initiate application for Farxiga  via [employer].  - Last UACR Oct 2025 - 21 mg/g (previously 78 mg/g) - Reviewed long term cardiovascular and renal outcomes of uncontrolled blood sugar - Reviewed goal A1c, goal fasting, and goal 2 hour post prandial glucose - Reviewed dietary modifications including utilizing the healthy plate method, limiting portion size of carbohydrate foods, increasing intake of protein and non-starchy vegetables. Counseled patient to stay hydrated with water throughout the day. - Reviewed lifestyle modifications including: aiming for 150 minutes of moderate intensity exercise every week.  -  Recommend to continue metformin  1000 mg BID  - Recommend to continue glipizide  to 5 mg BID - Recommend to initiate application for Farxiga  10 mg daily. Will collaborate with CPhT Burnard Lot. Educated on administration and possible side effects today, including sick day rules.  - Recommend to check fasting blood glucose daily.  - A1c due 06/18/24   Hypertension: - Currently controlled with clinic BP below goal <130/80. Variable control may be related to adherence issues. Emphasized importance of daily adherence - Reviewed long term cardiovascular and renal outcomes of uncontrolled blood pressure - Recommend to continue amlodipine  10 mg daily and valsartan  80 mg daily - Recommend to take diltiazem  30 mg TID PRN for heart racing as prescribed by cardiology   Hyperlipidemia/ASCVD Risk Reduction: - Currently controlled based on last lipid panel with LDL 61 mg/dL, below goal <29 mg/dL given risk factors aortic atherosclerosis, DM, HTN. Moderate-intensity statin is appropriate. Patient has upcoming coronary CTA on 05/06/24. - Reviewed long term complications of uncontrolled cholesterol - Recommend to continue atorvastatin  20 mg daily as prescribed.   Follow Up Plan: PCP 06/18/24, PharmD telephone 05/25/23   Lorain Baseman, PharmD [facility] [phone]

## 2024-04-26 ENCOUNTER — Other Ambulatory Visit: Payer: Self-pay

## 2024-04-29 ENCOUNTER — Other Ambulatory Visit: Payer: Self-pay

## 2024-05-02 ENCOUNTER — Other Ambulatory Visit: Payer: Self-pay

## 2024-05-02 ENCOUNTER — Encounter (HOSPITAL_COMMUNITY): Payer: Self-pay

## 2024-05-03 ENCOUNTER — Other Ambulatory Visit (HOSPITAL_COMMUNITY): Payer: Self-pay

## 2024-05-03 ENCOUNTER — Other Ambulatory Visit: Payer: Self-pay

## 2024-05-06 ENCOUNTER — Ambulatory Visit (HOSPITAL_COMMUNITY): Payer: Self-pay

## 2024-05-24 ENCOUNTER — Other Ambulatory Visit: Payer: Self-pay

## 2024-05-24 ENCOUNTER — Other Ambulatory Visit (HOSPITAL_COMMUNITY): Payer: Self-pay

## 2024-05-24 NOTE — Progress Notes (Unsigned)
 "  05/24/2024 Name: Jessica Garrett MRN: [id number] DOB: [birth year]  No chief complaint on file.   Jessica Garrett is a 53 y.o. year old female who presented for a telephone visit.  Subjective: She saw PCP, Lorice Shall, NP on 03/18/24. A1C improved to 7.7%. Patient reported she was taking glipizide  twice daily. At PCP visit on 03/18/24, A1C had improved to 7.4%. She saw cardiology for palpitations on 04/15/24. BP was at goal. She was initiated on diltiazem  PRN for heart racing. Scheduled cardiac CT for 05/06/24. 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 Unfortunately, application was denied due to patient needing to apply for Medicaid.   Today, ***  Today, patient reports doing ok. She has current supplies of all her medications per her report. However, valsartan  appears it may be running low based on fill hx. She denies needing refill at this time. Has not had issues picking up medications from [facility] pharmacy recently. Recalls discussing addition of SGLT2i with PCP at last visit and is agreeable to plan.   Care Team: Primary Care Provider: Paseda, Folashade R, FNP ; Next Scheduled Visit: 06/18/24  Medication Access/Adherence  Current Pharmacy:  [facility] - [facility] [address], [address] [place] [address] Phone: [phone] Fax: [phone]  [place] - [facility] [address] [place] [address] Phone: [phone] Fax: [phone]  [facility] [place] [place] - [address] [place] [address] [place] [address] Phone: [phone] Fax: [phone]   Patient reports affordability concerns with their medications: Yes - uninsured, using [facility]H at [facility] pharmacy Patient reports access/transportation concerns to their pharmacy: No  Patient reports adherence concerns with their medications:  Yes  - financial constraints lead to  nonadherence  Diabetes:  Current medications: metformin  IR 1000 mg BID, glipizide  IR 5 mg BID with meals   Not checking blood sugars regularly  Patient endorses hypoglycemic s/sx including dizziness, shakiness, sweating last week when she had gone a little while. Patient denies polydipsia, nocturia, polyuria,polyphagia, neuropathy, blurred vision.   Current meal patterns: Has added more fruits and vegetables. Cut out beef, pork.  ~1-2 meals per day ([employer]) - Lunch: sandwich, fruit, leftovers from night before - Supper: chicken, beef, pork, trying to cut back on pasta- has swapped out for wheat pasta, limiting fried foods ~1-2x per week, vegetables (cabbage, collard greens) - Snacks: berries, apples oranges, crackers and cheese, pickles, nuts, tries to avoid cookies - Drinks: water mainly, ginger ale (occasionally), alcohol once or twice a week (1-2 drinks at a time)  Current physical activity: Light walking. [facility]  Hypertension:  Current medications: amlodipine  5 mg daily, valsartan  80 mg daily  She does have a machine at home. She has not been checking.   Hyperlipidemia/ASCVD Risk Reduction  Current lipid lowering medications: atorvastatin  20 mg daily   Risk Factors: aortic atherosclerosis noted on CT in 2023, DM, HTN   Objective:  BP Readings from Last 3 Encounters:  04/15/24 126/66  03/18/24 130/72  03/08/24 (!) 144/71    Lab Results  Component Value Date   HGBA1C 7.4 (A) 03/18/2024   HGBA1C 7.7 (A) 12/11/2023   HGBA1C 8.4 (A) 09/15/2023    Lab Results  Component Value Date   CREATININE 0.81 03/05/2024   BUN 17 03/05/2024   NA 140 03/05/2024   K 4.0 03/05/2024  CL 102 03/05/2024   CO2 25 03/05/2024    Lab Results  Component Value Date   CHOL 127 12/11/2023   HDL 55 12/11/2023   LDLCALC 61 12/11/2023   TRIG 47 12/11/2023   CHOLHDL 2.3 12/11/2023    Medications Reviewed Today    Medications were not reviewed in this encounter       Assessment/Plan:   Diabetes: - Currently uncontrolled with last A1c 7.4% above goal <7%, but improved with improved adherence. She is a good candidate for addition of SGLT2i given elevated UACR in the past. Agreeable to initiate application for Farxiga  via [employer].  - Last UACR Oct 2025 - 21 mg/g (previously 78 mg/g) - Reviewed long term cardiovascular and renal outcomes of uncontrolled blood sugar - Reviewed goal A1c, goal fasting, and goal 2 hour post prandial glucose - Reviewed dietary modifications including utilizing the healthy plate method, limiting portion size of carbohydrate foods, increasing intake of protein and non-starchy vegetables. Counseled patient to stay hydrated with water throughout the day. - Reviewed lifestyle modifications including: aiming for 150 minutes of moderate intensity exercise every week.  - Recommend to continue metformin  1000 mg BID  - Recommend to continue glipizide  to 5 mg BID - Recommend to initiate application for Farxiga  10 mg daily. Will collaborate with CPhT Burnard Lot. Educated on administration and possible side effects today, including sick day rules.  - Recommend to check fasting blood glucose daily.  - A1c due 06/18/24   Hypertension: - Currently controlled with clinic BP below goal <130/80. Variable control may be related to adherence issues. Emphasized importance of daily adherence - Reviewed long term cardiovascular and renal outcomes of uncontrolled blood pressure - Recommend to continue amlodipine  10 mg daily and valsartan  80 mg daily - Recommend to take diltiazem  30 mg TID PRN for heart racing as prescribed by cardiology   Hyperlipidemia/ASCVD Risk Reduction: - Currently controlled based on last lipid panel with LDL 61 mg/dL, below goal <29 mg/dL given risk factors aortic atherosclerosis, DM, HTN. Moderate-intensity statin is appropriate. Patient has upcoming coronary CTA on  05/06/24. - Reviewed long term complications of uncontrolled cholesterol - Recommend to continue atorvastatin  20 mg daily as prescribed.   Follow Up Plan: PCP 06/18/24, PharmD telephone 05/25/23   Lorain Baseman, PharmD [facility] [phone]  "

## 2024-05-27 ENCOUNTER — Ambulatory Visit (HOSPITAL_COMMUNITY): Payer: Self-pay

## 2024-05-27 ENCOUNTER — Other Ambulatory Visit: Payer: Self-pay

## 2024-05-27 DIAGNOSIS — E1165 Type 2 diabetes mellitus with hyperglycemia: Secondary | ICD-10-CM

## 2024-05-27 DIAGNOSIS — Z794 Long term (current) use of insulin: Secondary | ICD-10-CM

## 2024-05-27 NOTE — Progress Notes (Signed)
 "  05/27/2024 Name: Jessica Garrett MRN: [id number] DOB: 05-14-1971  Chief Complaint  Patient presents with   Diabetes   Hypertension    Lizvette Gangi is a 54 y.o. year old female who presented for a telephone visit.  Subjective: She saw PCP, Lorice Shall, NP on 03/18/24. A1C improved to 7.7%. Patient reported she was taking glipizide  twice daily. At PCP visit on 03/18/24, A1C had improved to 7.4%. She saw cardiology for palpitations on 04/15/24. BP was at goal. She was initiated on diltiazem  PRN for heart racing. Scheduled cardiac CT for 05/06/24. 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 Unfortunately, application was denied due to patient needing to apply for Medicaid.   Today, patient reports doing ok. Reviewed medication list, which demonstrate she is overdue for refill for valsartan  and glipizide  (dispensed 30ds in Oct 2025), however patient states she is taking and declines a refill. Reviewed that AZ&Me application was denied due to needing to apply to Medicaid.   Care Team: Primary Care Provider: Paseda, Folashade R, FNP ; Next Scheduled Visit: 06/18/24  Medication Access/Adherence  Current Pharmacy:  [facility] - [facility] [address] [place] [address] Phone: [phone] Fax: [phone]  [place] [facility] [address], [address] [place] [address] Phone: [phone] Fax: [phone]  [facility] [place] - [address] [place] [address] Phone: [phone] Fax: [phone]   Patient reports affordability concerns with their medications: Yes - uninsured, using [facility] at [facility] pharmacy Patient reports access/transportation concerns to their pharmacy: No  Patient reports adherence concerns with their medications:  Yes  - financial constraints lead to nonadherence  Diabetes:  Current medications:  metformin  IR 1000 mg BID, glipizide  IR 5 mg BID with meals   Not checking blood sugars regularly  Patient denies hypoglycemic s/sx including dizziness, shakiness, sweating. Patient denies polydipsia, nocturia, polyuria,polyphagia, neuropathy, blurred vision.   Current meal patterns as reported at a previous visit: Has added more fruits and vegetables. Cut out beef, pork.  ~1-2 meals per day ([employer]) - Lunch: sandwich, fruit, leftovers from night before - Supper: chicken, beef, pork, trying to cut back on pasta- has swapped out for wheat pasta, limiting fried foods ~1-2x per week, vegetables (cabbage, collard greens) - Snacks: berries, apples oranges, crackers and cheese, pickles, nuts, tries to avoid cookies - Drinks: water mainly, ginger ale (occasionally), alcohol once or twice a week (1-2 drinks at a time)  Current physical activity: Light walking. [facility]  Hypertension:  Current medications: amlodipine  5 mg daily, valsartan  80 mg daily  She does have a machine at home. She has not been checking.   Hyperlipidemia/ASCVD Risk Reduction  Current lipid lowering medications: atorvastatin  20 mg daily   Risk Factors: aortic atherosclerosis noted on CT in 2023, DM, HTN   Objective:  BP Readings from Last 3 Encounters:  04/15/24 126/66  03/18/24 130/72  03/08/24 (!) 144/71    Lab Results  Component Value Date   HGBA1C 7.4 (A) 03/18/2024   HGBA1C 7.7 (A) 12/11/2023   HGBA1C 8.4 (A) 09/15/2023    Lab Results  Component Value Date   CREATININE 0.81 03/05/2024   BUN 17 03/05/2024   NA 140 03/05/2024   K 4.0 03/05/2024   CL 102 03/05/2024   CO2 25 03/05/2024    Lab  Results  Component Value Date   CHOL 127 12/11/2023   HDL 55 12/11/2023   LDLCALC 61 12/11/2023   TRIG 47 12/11/2023   CHOLHDL 2.3 12/11/2023    Medications Reviewed Today     Reviewed by Brinda Lorain SQUIBB, RPH-CPP (Pharmacist) on 05/27/24 at 1857   Med List Status: <None>   Medication Order Taking? Sig Documenting Provider Last Dose Status Informant  albuterol  (VENTOLIN  HFA) 108 (90 Base) MCG/ACT inhaler [id number]  Inhale 2 puffs into the lungs every 6 (six) hours as needed for wheezing or shortness of breath. [provider]  Active   amLODipine  (NORVASC ) 5 MG tablet [id number] Yes Take 1.5 tablets (7.5 mg total) by mouth daily. Paseda, Folashade R, FNP  Active   atorvastatin  (LIPITOR) 20 MG tablet [id number] Yes Take 1 tablet (20 mg total) by mouth daily. Paseda, Folashade R, FNP  Active   Blood Glucose Monitoring Suppl DEVI [id number]  1 each by Does not apply route in the morning, at noon, and at bedtime. May substitute to any manufacturer covered by patient's insurance. Paseda, Folashade R, FNP  Active   dapagliflozin  propanediol (FARXIGA ) 10 MG TABS tablet [id number]  Take 1 tablet (10 mg total) by mouth daily.  Patient not taking: Reported on 05/27/2024   Paseda, Folashade R, FNP  Active   diltiazem  (CARDIZEM ) 30 MG tablet [id number] Yes Take 1 tablet (30 mg total) by mouth 3 (three) times daily as needed. Patwardhan, Newman PARAS, MD  Active   DULoxetine  (CYMBALTA ) 30 MG capsule [id number]  Take 2 capsules (60 mg total) by mouth daily. Paseda, Folashade R, FNP  Active   fluticasone  (FLONASE ) 50 MCG/ACT nasal spray [id number]  Place 2 sprays into both nostrils daily. Paseda, Folashade R, FNP  Active            Med Note (HERNANDEZ-GARCIA, GEORGINA   Tue Nov 22, 2022  8:55 AM) PRN   glipiZIDE  (GLUCOTROL ) 5 MG tablet [id number] Yes Take 1 tablet (5 mg total) by mouth 2 (two) times daily before a meal. Paseda, Folashade R, FNP  Active   metFORMIN  (GLUCOPHAGE ) 1000 MG tablet [id number] Yes Take 1 tablet (1,000 mg total) by mouth 2 (two) times daily with a meal. Paseda, Folashade R, FNP  Active   metoprolol  tartrate (LOPRESSOR ) 50 MG tablet [id number]  Take 1 tablet (50 mg total) by mouth once for 1 dose. Take 90-120 minutes prior to scan. Hold for  SBP less than 110. [place] Newman PARAS, MD  Expired 04/16/24 2359   sodium chloride  (OCEAN) 0.65 % SOLN nasal spray [id number]  Place 1 spray into both nostrils as needed for congestion.  Patient not taking: Reported on 04/15/2024   Paseda, Folashade R, FNP  Active            Med Note (HERNANDEZ-GARCIA, GEORGINA   Tue Nov 22, 2022  8:55 AM) PRN   valsartan  (DIOVAN ) 80 MG tablet [id number] Yes Take 1 tablet (80 mg total) by mouth daily. Paseda, Folashade R, FNP  Active               Assessment/Plan:   Diabetes: - Currently uncontrolled with last A1c 7.4% above goal <7%, but improved with improved adherence. She is a good candidate for addition of SGLT2i given elevated UACR in the past. However, application for Farxiga  via AZ&Me was denied due to patient needing to apply to Medicaid. She has been counseled to apply for Medicaid in the past. Reeducate dtoday..  - Last UACR Oct 2025 - 21  mg/g (previously 78 mg/g) - Reviewed long term cardiovascular and renal outcomes of uncontrolled blood sugar - Reviewed goal A1c, goal fasting, and goal 2 hour post prandial glucose - Reviewed dietary modifications including utilizing the healthy plate method, limiting portion size of carbohydrate foods, increasing intake of protein and non-starchy vegetables. Counseled patient to stay hydrated with water throughout the day. - Reviewed lifestyle modifications including: aiming for 150 minutes of moderate intensity exercise every week.  - Recommend to continue metformin  1000 mg BID  - Recommend to continue glipizide  to 5 mg BID - If denied for Medicaid, can resubmit Farxiga  application. Provided instructions on how to apply at [facility] office. - Recommend to check fasting blood glucose daily.  - A1c due 06/18/24   Hypertension: - Currently controlled with clinic BP below goal <130/80. Variable control may be related to adherence issues. Emphasized importance of daily adherence - Reviewed long term  cardiovascular and renal outcomes of uncontrolled blood pressure - Recommend to continue amlodipine  10 mg daily and valsartan  80 mg daily - Recommend to take diltiazem  30 mg TID PRN for heart racing as prescribed by cardiology   Hyperlipidemia/ASCVD Risk Reduction: - Currently controlled based on last lipid panel with LDL 61 mg/dL, below goal <29 mg/dL given risk factors aortic atherosclerosis, DM, HTN. Moderate-intensity statin is appropriate. She did not show for coronary CTA on 05/06/24. - Reviewed long term complications of uncontrolled cholesterol - Recommend to continue atorvastatin  20 mg daily as prescribed.   Follow Up Plan: PCP 06/18/24, PharmD telephone 07/16/24   Lorain Baseman, PharmD [facility] [phone]  "

## 2024-06-12 ENCOUNTER — Telehealth: Payer: Self-pay | Admitting: Pharmacy Technician

## 2024-06-12 ENCOUNTER — Other Ambulatory Visit: Payer: Self-pay

## 2024-06-12 NOTE — Progress Notes (Cosign Needed Addendum)
 "  06/12/2024 Name: Jessica Garrett MRN: [id number] DOB: 1971/01/25  Patient is appearing for a follow-up visit with the population health pharmacy technician. Last engaged with the clinical pharmacist to discuss diabetes and hypertension on 05/27/2024. Contacted patient today to discuss medication access.   Plan from last clinical pharmacist appointment: Diabetes: - Currently uncontrolled with last A1c 7.4% above goal <7%, but improved with improved adherence. She is a good candidate for addition of SGLT2i given elevated UACR in the past. However, application for Farxiga  via AZ&Me was denied due to patient needing to apply to Medicaid. She has been counseled to apply for Medicaid in the past. Reeducate dtoday..  - Last UACR Oct 2025 - 21 mg/g (previously 78 mg/g) - Reviewed long term cardiovascular and renal outcomes of uncontrolled blood sugar - Reviewed goal A1c, goal fasting, and goal 2 hour post prandial glucose - Reviewed dietary modifications including utilizing the healthy plate method, limiting portion size of carbohydrate foods, increasing intake of protein and non-starchy vegetables. Counseled patient to stay hydrated with water throughout the day. - Reviewed lifestyle modifications including: aiming for 150 minutes of moderate intensity exercise every week.  - Recommend to continue metformin  1000 mg BID  - Recommend to continue glipizide  to 5 mg BID - If denied for Medicaid, can resubmit Farxiga  application. Provided instructions on how to apply at [facility] office. - Recommend to check fasting blood glucose daily.  - A1c due 06/18/24 Hypertension: - Currently controlled with clinic BP below goal <130/80. Variable control may be related to adherence issues. Emphasized importance of daily adherence - Reviewed long term cardiovascular and renal outcomes of uncontrolled blood pressure - Recommend to continue amlodipine  10 mg daily and valsartan  80 mg daily - Recommend to take diltiazem  30  mg TID PRN for heart racing as prescribed by cardiology Hyperlipidemia/ASCVD Risk Reduction: - Currently controlled based on last lipid panel with LDL 61 mg/dL, below goal <29 mg/dL given risk factors aortic atherosclerosis, DM, HTN. Moderate-intensity statin is appropriate. She did not show for coronary CTA on 05/06/24. - Reviewed long term complications of uncontrolled cholesterol - Recommend to continue atorvastatin  20 mg daily as prescribed. Follow Up Plan: PCP 06/18/24, PharmD telephone 07/16/24(copy/paste from last note)   Medication Adherence Barriers Identified:  Patient made recommended medication changes per plan: Yes Patient informs she has Valsartan , Metformin  and Atorvastatin . She informs she could use a refill sent in for Glipizide  and Amlodipine . Access issues with any new medication or testing device: Yes Patient reports she needs more Amlodipine  and Glipizide . Amlodipine  on medication list does NOT match dose in PharmD notes. Will collaborate with PharmD to update medication list and send in new prescription for Amlodipine . Once this has been completed, will call pharmacy and request refill of Glipizide  as well. Patient has NOT yet applied for Medicaid. Patient is still interested in applying for Medicaid but was not able to provide a timeline for when she could go to the [facility] office at [facility] to get this completed. Informed patient that her providers wanted to start her on a new medication but the manufacturer's program is requiring her to apply for Medicaid first before they will make a decision. Patient verbalized understanding.  Medication Adherence Barriers Addressed/Actions Taken:  Reviewed medication changes per plan from last clinical pharmacist note Medication Access for Amlodipine  and Glipizide  Will discuss medication access concerns with pharmacist Collaborated with clinic PharmD to send in new prescription for Amlodipine . Contacted pharmacy regarding  Glipizide  refill and Amlodipine  dose  Spoke to Jessalyn. She will process refill for Glipizide  5mg  twice a day. Per Jessalyn, the dose they have on filed for Amlodipine  is 7.5mg  daily (1.5 tablets of the 5mg ).  Will collaborate with PharmD to update Amlodipine  prescription. Encouraged patient to apply for Medicaid at [facility] when she picks up her prescriptions. Reviewed instructions for monitoring blood sugars at home and reminded patient to keep a written log to review with pharmacist Reminded patient of date/time of upcoming clinical pharmacist follow up and any upcoming PCP/specialists visits. Patient denies transportation barriers to the appointment. Yes  Next clinical pharmacist appointment is scheduled for: 07/16/2024   ADDENDUM 1/28/206 2:24 pm PharmD returned the message and informs correct dose of Amlodipine  is 7.5mg   every day. Called the pharmacy back at [facility] Spoke to Jessalyn and she will refill the medication for the patient to pick up.  Trasean Delima, CPhT [facility] Office: [phone] Email: [email or web address]  "

## 2024-06-18 ENCOUNTER — Ambulatory Visit: Payer: Self-pay | Admitting: Nurse Practitioner

## 2024-06-20 ENCOUNTER — Other Ambulatory Visit: Payer: Self-pay

## 2024-07-15 ENCOUNTER — Ambulatory Visit: Payer: Self-pay | Admitting: Cardiology

## 2024-07-16 ENCOUNTER — Other Ambulatory Visit: Payer: Self-pay

## 2024-07-17 ENCOUNTER — Ambulatory Visit: Payer: Self-pay | Admitting: Nurse Practitioner
